# Patient Record
Sex: Female | Born: 1935 | Smoking: Former smoker
Health system: Southern US, Community
[De-identification: ages and names within clinical notes are randomized; demographics above are authoritative.]

## PROBLEM LIST (undated history)

## (undated) DIAGNOSIS — E871 Hypo-osmolality and hyponatremia: Secondary | ICD-10-CM

## (undated) DIAGNOSIS — I10 Essential (primary) hypertension: Secondary | ICD-10-CM

## (undated) DIAGNOSIS — M109 Gout, unspecified: Secondary | ICD-10-CM

## (undated) DIAGNOSIS — H547 Unspecified visual loss: Secondary | ICD-10-CM

## (undated) DIAGNOSIS — E7800 Pure hypercholesterolemia, unspecified: Secondary | ICD-10-CM

## (undated) DIAGNOSIS — I4891 Unspecified atrial fibrillation: Secondary | ICD-10-CM

## (undated) DIAGNOSIS — E876 Hypokalemia: Secondary | ICD-10-CM

## (undated) HISTORY — DX: Hypo-osmolality and hyponatremia: E87.1

## (undated) HISTORY — DX: Hypokalemia: E87.6

## (undated) HISTORY — DX: Unspecified atrial fibrillation: I48.91

## (undated) HISTORY — DX: Unspecified visual loss: H54.7

## (undated) HISTORY — DX: Pure hypercholesterolemia, unspecified: E78.00

---

## 2011-08-01 ENCOUNTER — Emergency Department: Payer: Self-pay | Admitting: Emergency Medicine

## 2011-08-01 LAB — BASIC METABOLIC PANEL
Anion Gap: 5 — ABNORMAL LOW (ref 7–16)
BUN: 13 mg/dL (ref 7–18)
Calcium, Total: 9.2 mg/dL (ref 8.5–10.1)
Chloride: 96 mmol/L — ABNORMAL LOW (ref 98–107)
Co2: 31 mmol/L (ref 21–32)
Creatinine: 0.95 mg/dL (ref 0.60–1.30)
EGFR (African American): >60
EGFR (Non-African Amer.): 58 — ABNORMAL LOW
Glucose: 112 mg/dL — ABNORMAL HIGH (ref 65–99)
Osmolality: 265 (ref 275–301)
Potassium: 3.2 mmol/L — ABNORMAL LOW (ref 3.5–5.1)
Sodium: 132 mmol/L — ABNORMAL LOW (ref 136–145)

## 2011-08-01 LAB — CBC
HCT: 36.3 % (ref 35.0–47.0)
HGB: 12.4 g/dL (ref 12.0–16.0)
MCH: 30.6 pg (ref 26.0–34.0)
MCHC: 34.2 g/dL (ref 32.0–36.0)
MCV: 89 fL (ref 80–100)
Platelet: 236 10*3/uL (ref 150–440)
RBC: 4.06 10*6/uL (ref 3.80–5.20)
RDW: 13.2 % (ref 11.5–14.5)
WBC: 11.6 10*3/uL — ABNORMAL HIGH (ref 3.6–11.0)

## 2011-08-01 IMAGING — CT CT MAXILLOFACIAL WITH CONTRAST
1 series · 15 of 30 positions shown, 19 images · IV contrast (isovue)
Comparison: No comparison

REASON FOR EXAM: facial swelling right side, worse today    flex 10
COMMENTS:

PROCEDURE:     CT  - CT MAXILLOFACIAL AREA W  - [DATE] [DATE]
RESULT:     History: Facial swelling
TECHNIQUE: Multiple axial images obtained of the maxillofacial bones with
coronal reformatted images provided following 75 mL of Isovue 300
intravenous contrast.

[Series 4: facial 3.0 soft tissue · axial · 0.34mm/px · z∈[-174,+3]mm · 15 of 65 slices shown, 19 images]
[im 3/65  brain]
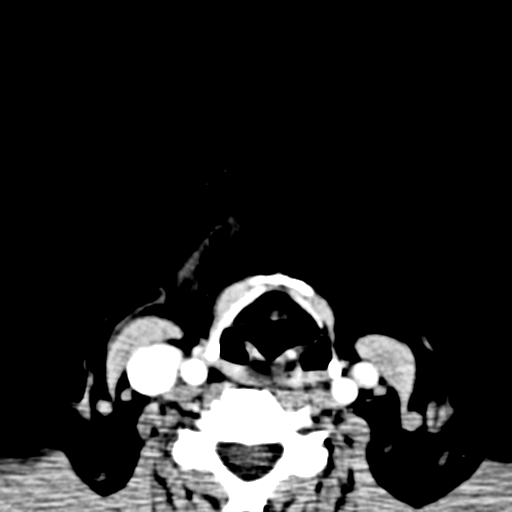
[im 3/65  bone]
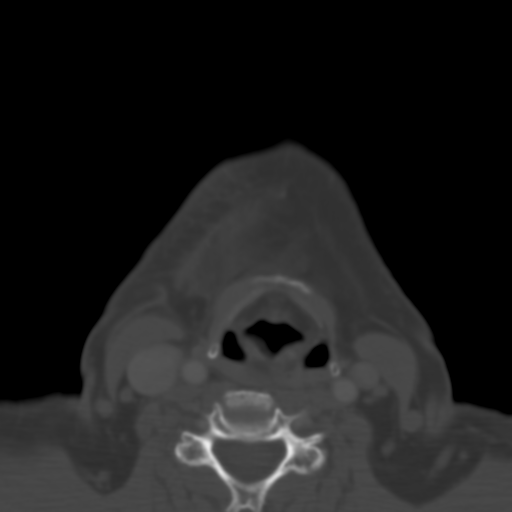
[im 7/65  bone]
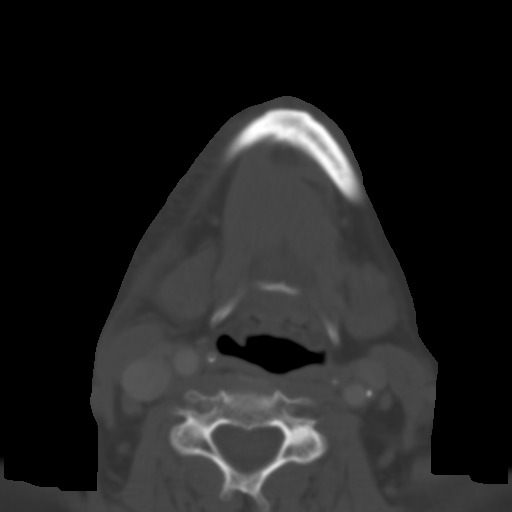
[im 12/65  bone]
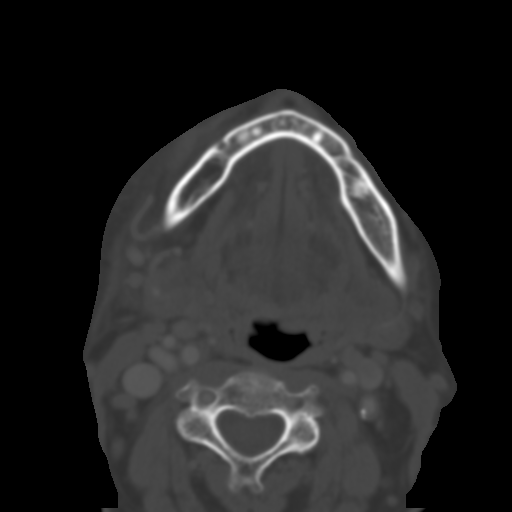
[im 16/65  bone]
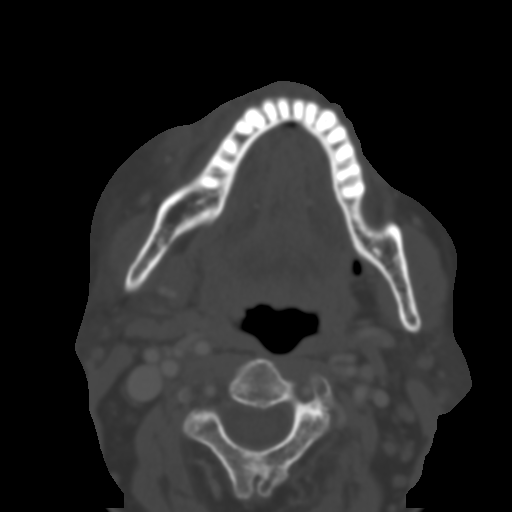
[im 20/65  brain]
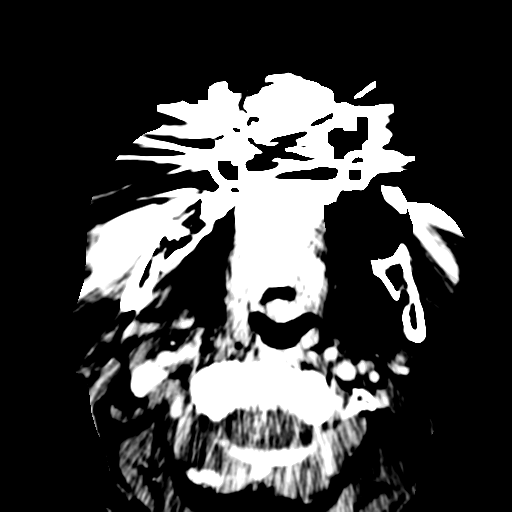
[im 20/65  bone]
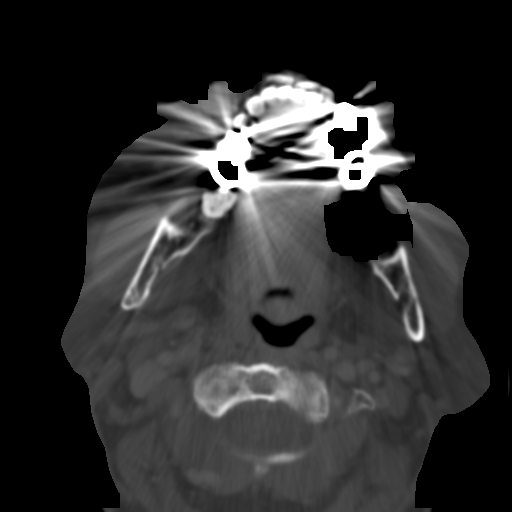
[im 25/65  bone]
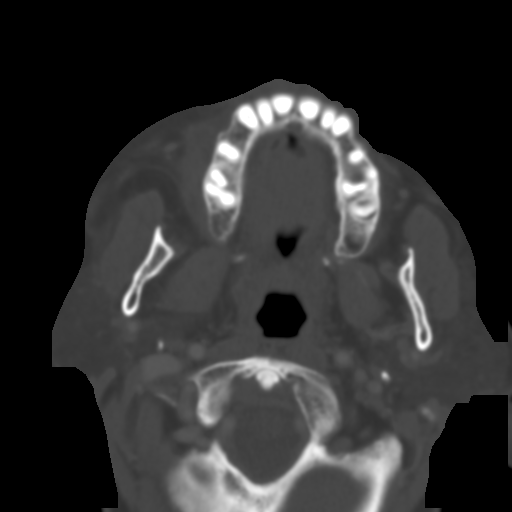
[im 29/65  bone]
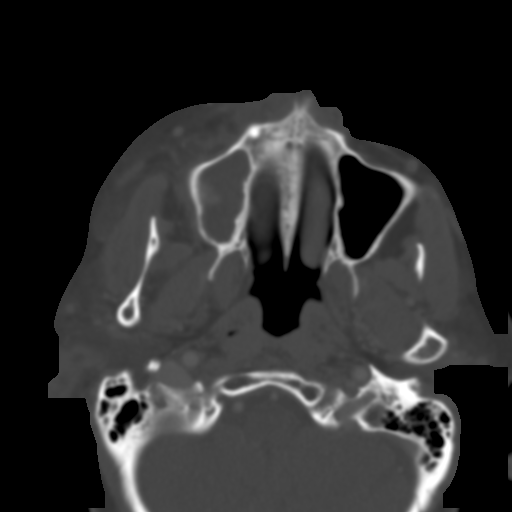
[im 34/65  bone]
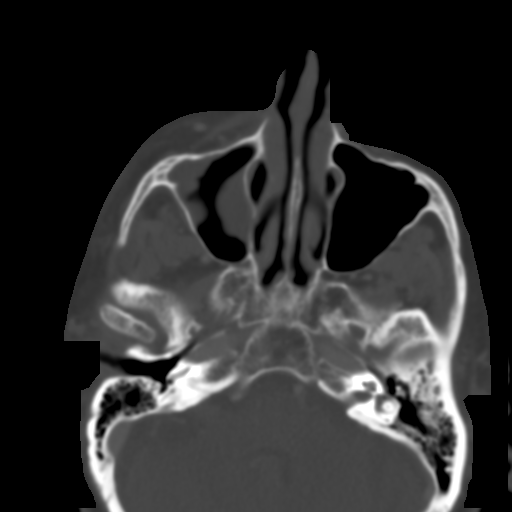
[im 36/65  brain]
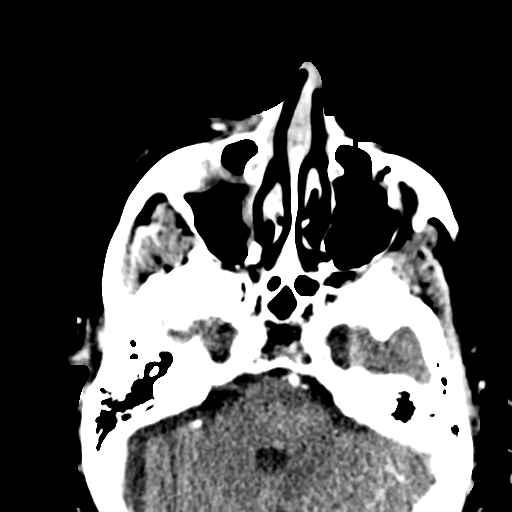
[im 36/65  bone]
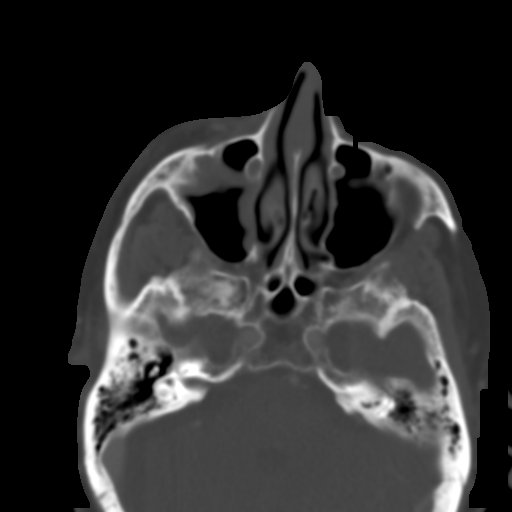
[im 40/65  bone]
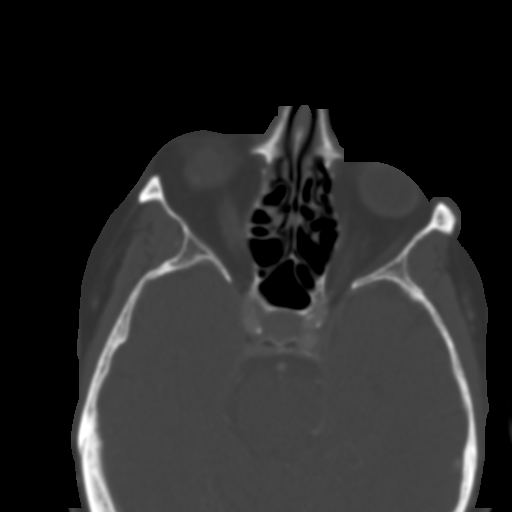
[im 45/65  bone]
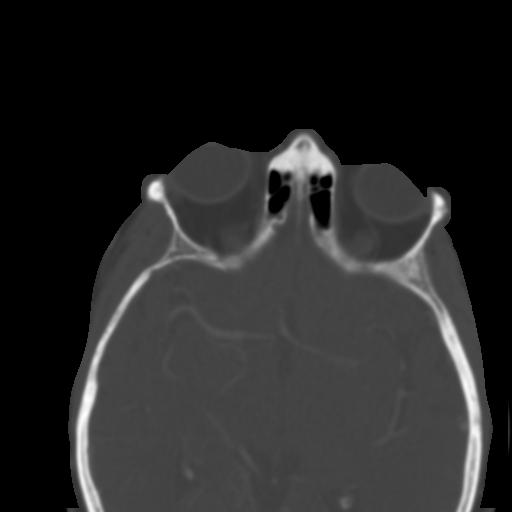
[im 49/65  bone]
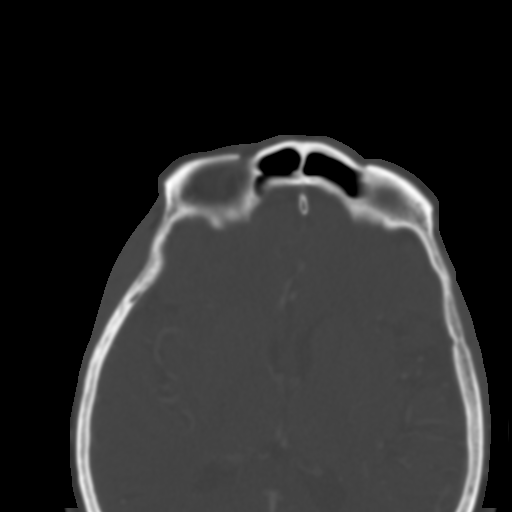
[im 53/65  brain]
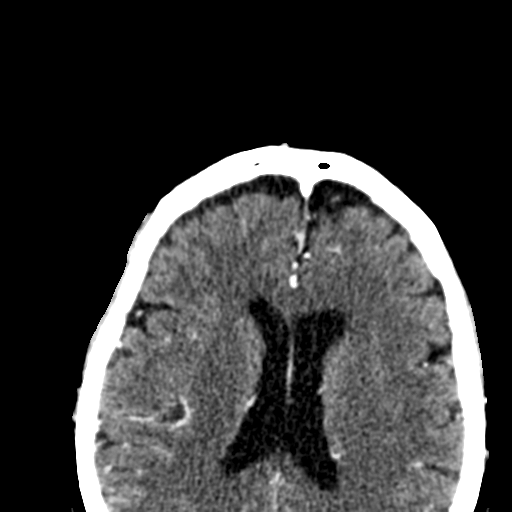
[im 53/65  bone]
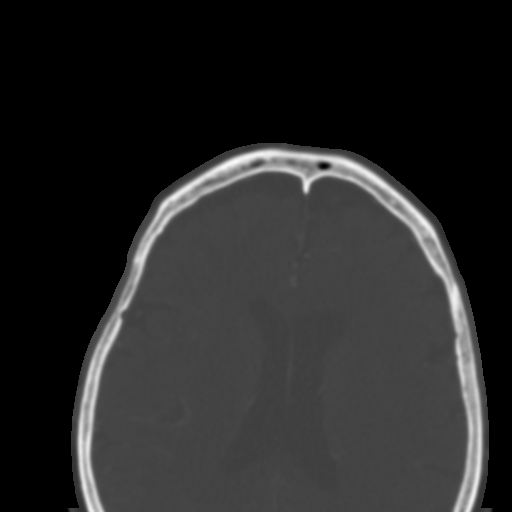
[im 58/65  bone]
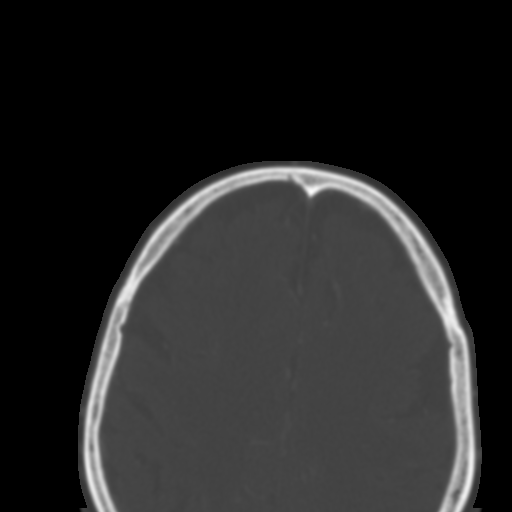
[im 62/65  bone]
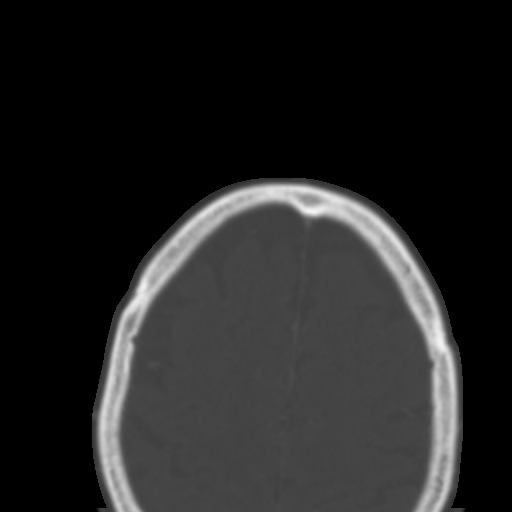

[15 of 30 positions shown; findings below may reference images not displayed]

FINDINGS: There is right facial soft tissue swelling. The soft tissues at the level of
the body of the mandible are secured by beam hardening artifact from dental
hardware. There is mild hazy inflammatory changes within the subcutaneous
fat. There is no focal fluid collection to suggest an abscess. There are
increased area of soft tissue attenuation along the right side of the
maxilla concerning for a phlegmon. Correlate with odontogenic disease and
dental exam. There is right maxillary sinus mucosal thickening.

The globes are intact. The orbital walls are intact. The orbital floor is
intact. The maxilla and mandible are intact. The zygomatic arches are
intact. The nasal septum is midline. There is no nasal bone fracture. The
temporomandibular joints are normal.

The visualized portions of the mastoid sinuses are well aerated.
IMPRESSION: There is right facial soft tissue swelling. The soft tissues at the level of
the body of the mandible are secured by beam hardening artifact from dental
hardware. There is mild hazy inflammatory changes within the subcutaneous
fat. There is no focal fluid collection to suggest an abscess. There are
increased area of soft tissue attenuation along the right side of the
maxilla concerning for a phlegmon. Correlate with odontogenic disease and
dental exam. There is right maxillary sinus mucosal thickening.

[REDACTED]

## 2014-05-03 DIAGNOSIS — R7301 Impaired fasting glucose: Secondary | ICD-10-CM | POA: Diagnosis not present

## 2014-05-03 DIAGNOSIS — N289 Disorder of kidney and ureter, unspecified: Secondary | ICD-10-CM | POA: Diagnosis not present

## 2014-12-17 DIAGNOSIS — E782 Mixed hyperlipidemia: Secondary | ICD-10-CM | POA: Diagnosis not present

## 2014-12-17 DIAGNOSIS — R7309 Other abnormal glucose: Secondary | ICD-10-CM | POA: Diagnosis not present

## 2014-12-17 DIAGNOSIS — N183 Chronic kidney disease, stage 3 (moderate): Secondary | ICD-10-CM | POA: Diagnosis not present

## 2014-12-17 DIAGNOSIS — I10 Essential (primary) hypertension: Secondary | ICD-10-CM | POA: Diagnosis not present

## 2014-12-17 DIAGNOSIS — Z1389 Encounter for screening for other disorder: Secondary | ICD-10-CM | POA: Diagnosis not present

## 2014-12-17 DIAGNOSIS — M109 Gout, unspecified: Secondary | ICD-10-CM | POA: Diagnosis not present

## 2014-12-17 DIAGNOSIS — Z23 Encounter for immunization: Secondary | ICD-10-CM | POA: Diagnosis not present

## 2016-01-06 DIAGNOSIS — R7303 Prediabetes: Secondary | ICD-10-CM | POA: Diagnosis not present

## 2016-01-06 DIAGNOSIS — E782 Mixed hyperlipidemia: Secondary | ICD-10-CM | POA: Diagnosis not present

## 2016-01-06 DIAGNOSIS — N183 Chronic kidney disease, stage 3 (moderate): Secondary | ICD-10-CM | POA: Diagnosis not present

## 2016-01-06 DIAGNOSIS — I10 Essential (primary) hypertension: Secondary | ICD-10-CM | POA: Diagnosis not present

## 2016-01-06 DIAGNOSIS — M109 Gout, unspecified: Secondary | ICD-10-CM | POA: Diagnosis not present

## 2016-10-02 DIAGNOSIS — M109 Gout, unspecified: Secondary | ICD-10-CM | POA: Diagnosis not present

## 2016-10-02 DIAGNOSIS — H6993 Unspecified Eustachian tube disorder, bilateral: Secondary | ICD-10-CM | POA: Diagnosis not present

## 2016-10-02 DIAGNOSIS — H6123 Impacted cerumen, bilateral: Secondary | ICD-10-CM | POA: Diagnosis not present

## 2017-01-07 DIAGNOSIS — E782 Mixed hyperlipidemia: Secondary | ICD-10-CM | POA: Diagnosis not present

## 2017-01-07 DIAGNOSIS — M109 Gout, unspecified: Secondary | ICD-10-CM | POA: Diagnosis not present

## 2017-01-07 DIAGNOSIS — I10 Essential (primary) hypertension: Secondary | ICD-10-CM | POA: Diagnosis not present

## 2017-01-07 DIAGNOSIS — R7303 Prediabetes: Secondary | ICD-10-CM | POA: Diagnosis not present

## 2017-02-05 ENCOUNTER — Encounter (HOSPITAL_BASED_OUTPATIENT_CLINIC_OR_DEPARTMENT_OTHER): Payer: Self-pay | Admitting: Emergency Medicine

## 2017-02-05 ENCOUNTER — Emergency Department (HOSPITAL_BASED_OUTPATIENT_CLINIC_OR_DEPARTMENT_OTHER): Payer: Medicare HMO

## 2017-02-05 ENCOUNTER — Other Ambulatory Visit: Payer: Self-pay

## 2017-02-05 ENCOUNTER — Observation Stay (HOSPITAL_BASED_OUTPATIENT_CLINIC_OR_DEPARTMENT_OTHER)
Admission: EM | Admit: 2017-02-05 | Discharge: 2017-02-07 | Disposition: A | Discharge status: Home / Self Care | Payer: Medicare HMO | Attending: Internal Medicine | Admitting: Internal Medicine

## 2017-02-05 DIAGNOSIS — R55 Syncope and collapse: Secondary | ICD-10-CM | POA: Insufficient documentation

## 2017-02-05 DIAGNOSIS — Z91040 Latex allergy status: Secondary | ICD-10-CM | POA: Insufficient documentation

## 2017-02-05 DIAGNOSIS — I480 Paroxysmal atrial fibrillation: Secondary | ICD-10-CM | POA: Diagnosis not present

## 2017-02-05 DIAGNOSIS — I4891 Unspecified atrial fibrillation: Secondary | ICD-10-CM | POA: Diagnosis present

## 2017-02-05 DIAGNOSIS — I482 Chronic atrial fibrillation: Secondary | ICD-10-CM | POA: Diagnosis not present

## 2017-02-05 DIAGNOSIS — Z79899 Other long term (current) drug therapy: Secondary | ICD-10-CM | POA: Diagnosis not present

## 2017-02-05 DIAGNOSIS — I10 Essential (primary) hypertension: Secondary | ICD-10-CM | POA: Insufficient documentation

## 2017-02-05 DIAGNOSIS — E876 Hypokalemia: Secondary | ICD-10-CM | POA: Diagnosis present

## 2017-02-05 DIAGNOSIS — Z880 Allergy status to penicillin: Secondary | ICD-10-CM | POA: Diagnosis not present

## 2017-02-05 DIAGNOSIS — R946 Abnormal results of thyroid function studies: Secondary | ICD-10-CM | POA: Insufficient documentation

## 2017-02-05 DIAGNOSIS — E871 Hypo-osmolality and hyponatremia: Secondary | ICD-10-CM | POA: Diagnosis not present

## 2017-02-05 DIAGNOSIS — R531 Weakness: Secondary | ICD-10-CM | POA: Diagnosis not present

## 2017-02-05 DIAGNOSIS — R110 Nausea: Secondary | ICD-10-CM | POA: Insufficient documentation

## 2017-02-05 DIAGNOSIS — I700 Atherosclerosis of aorta: Secondary | ICD-10-CM | POA: Diagnosis not present

## 2017-02-05 DIAGNOSIS — R7989 Other specified abnormal findings of blood chemistry: Secondary | ICD-10-CM | POA: Diagnosis not present

## 2017-02-05 DIAGNOSIS — R002 Palpitations: Secondary | ICD-10-CM | POA: Insufficient documentation

## 2017-02-05 HISTORY — DX: Essential (primary) hypertension: I10

## 2017-02-05 HISTORY — DX: Gout, unspecified: M10.9

## 2017-02-05 LAB — BASIC METABOLIC PANEL
Anion gap: 12 (ref 5–15)
BUN: 18 mg/dL (ref 6–20)
CO2: 23 mmol/L (ref 22–32)
Calcium: 8.3 mg/dL — ABNORMAL LOW (ref 8.9–10.3)
Chloride: 90 mmol/L — ABNORMAL LOW (ref 101–111)
Creatinine, Ser: 1.16 mg/dL — ABNORMAL HIGH (ref 0.44–1.00)
GFR calc Af Amer: 50 mL/min — ABNORMAL LOW (ref >60)
GFR calc non Af Amer: 43 mL/min — ABNORMAL LOW (ref >60)
Glucose, Bld: 107 mg/dL — ABNORMAL HIGH (ref 65–99)
Potassium: 2.7 mmol/L — CL (ref 3.5–5.1)
Sodium: 125 mmol/L — ABNORMAL LOW (ref 135–145)

## 2017-02-05 LAB — CBC WITH DIFFERENTIAL/PLATELET
Basophils Absolute: 0 10*3/uL (ref 0.0–0.1)
Basophils Relative: 0 %
Eosinophils Absolute: 0 10*3/uL (ref 0.0–0.7)
Eosinophils Relative: 0 %
HCT: 34.2 % — ABNORMAL LOW (ref 36.0–46.0)
Hemoglobin: 12 g/dL (ref 12.0–15.0)
Lymphocytes Relative: 16 %
Lymphs Abs: 1.9 10*3/uL (ref 0.7–4.0)
MCH: 30.1 pg (ref 26.0–34.0)
MCHC: 35.1 g/dL (ref 30.0–36.0)
MCV: 85.7 fL (ref 78.0–100.0)
Monocytes Absolute: 0.6 10*3/uL (ref 0.1–1.0)
Monocytes Relative: 5 %
Neutro Abs: 9.6 10*3/uL — ABNORMAL HIGH (ref 1.7–7.7)
Neutrophils Relative %: 79 %
Platelets: 184 10*3/uL (ref 150–400)
RBC: 3.99 MIL/uL (ref 3.87–5.11)
RDW: 12.8 % (ref 11.5–15.5)
WBC: 12.1 10*3/uL — ABNORMAL HIGH (ref 4.0–10.5)

## 2017-02-05 LAB — COMPREHENSIVE METABOLIC PANEL
ALT: 20 U/L (ref 14–54)
AST: 32 U/L (ref 15–41)
Albumin: 3.6 g/dL (ref 3.5–5.0)
Alkaline Phosphatase: 50 U/L (ref 38–126)
Anion gap: 14 (ref 5–15)
BUN: 17 mg/dL (ref 6–20)
CO2: 23 mmol/L (ref 22–32)
Calcium: 8.8 mg/dL — ABNORMAL LOW (ref 8.9–10.3)
Chloride: 85 mmol/L — ABNORMAL LOW (ref 101–111)
Creatinine, Ser: 1.12 mg/dL — ABNORMAL HIGH (ref 0.44–1.00)
GFR calc Af Amer: 52 mL/min — ABNORMAL LOW (ref >60)
GFR calc non Af Amer: 45 mL/min — ABNORMAL LOW (ref >60)
Glucose, Bld: 129 mg/dL — ABNORMAL HIGH (ref 65–99)
Potassium: 3.1 mmol/L — ABNORMAL LOW (ref 3.5–5.1)
Sodium: 122 mmol/L — ABNORMAL LOW (ref 135–145)
Total Bilirubin: 1 mg/dL (ref 0.3–1.2)
Total Protein: 7.1 g/dL (ref 6.5–8.1)

## 2017-02-05 LAB — OSMOLALITY, URINE: Osmolality, Ur: 362 mOsm/kg (ref 300–900)

## 2017-02-05 LAB — PROTIME-INR
INR: 0.96
Prothrombin Time: 12.7 seconds (ref 11.4–15.2)

## 2017-02-05 LAB — LIPASE, BLOOD: Lipase: 18 U/L (ref 11–51)

## 2017-02-05 LAB — TSH: TSH: 6.325 u[IU]/mL — ABNORMAL HIGH (ref 0.350–4.500)

## 2017-02-05 LAB — TROPONIN I: Troponin I: <0.03 ng/mL (ref <0.03)

## 2017-02-05 LAB — T4, FREE: Free T4: 1.21 ng/dL — ABNORMAL HIGH (ref 0.61–1.12)

## 2017-02-05 LAB — D-DIMER, QUANTITATIVE: D-Dimer, Quant: 1.21 ug/mL-FEU — ABNORMAL HIGH (ref 0.00–0.50)

## 2017-02-05 LAB — OSMOLALITY: Osmolality: 266 mOsm/kg — ABNORMAL LOW (ref 275–295)

## 2017-02-05 LAB — CBG MONITORING, ED: Glucose-Capillary: 142 mg/dL — ABNORMAL HIGH (ref 65–99)

## 2017-02-05 IMAGING — CR DG CHEST 2V
2 series · 2 of 2 positions shown · non-contrast
Comparison: None.

CLINICAL DATA: Weakness and fatigue for the past week.

EXAM:
CHEST  2 VIEW

[w chest pa]
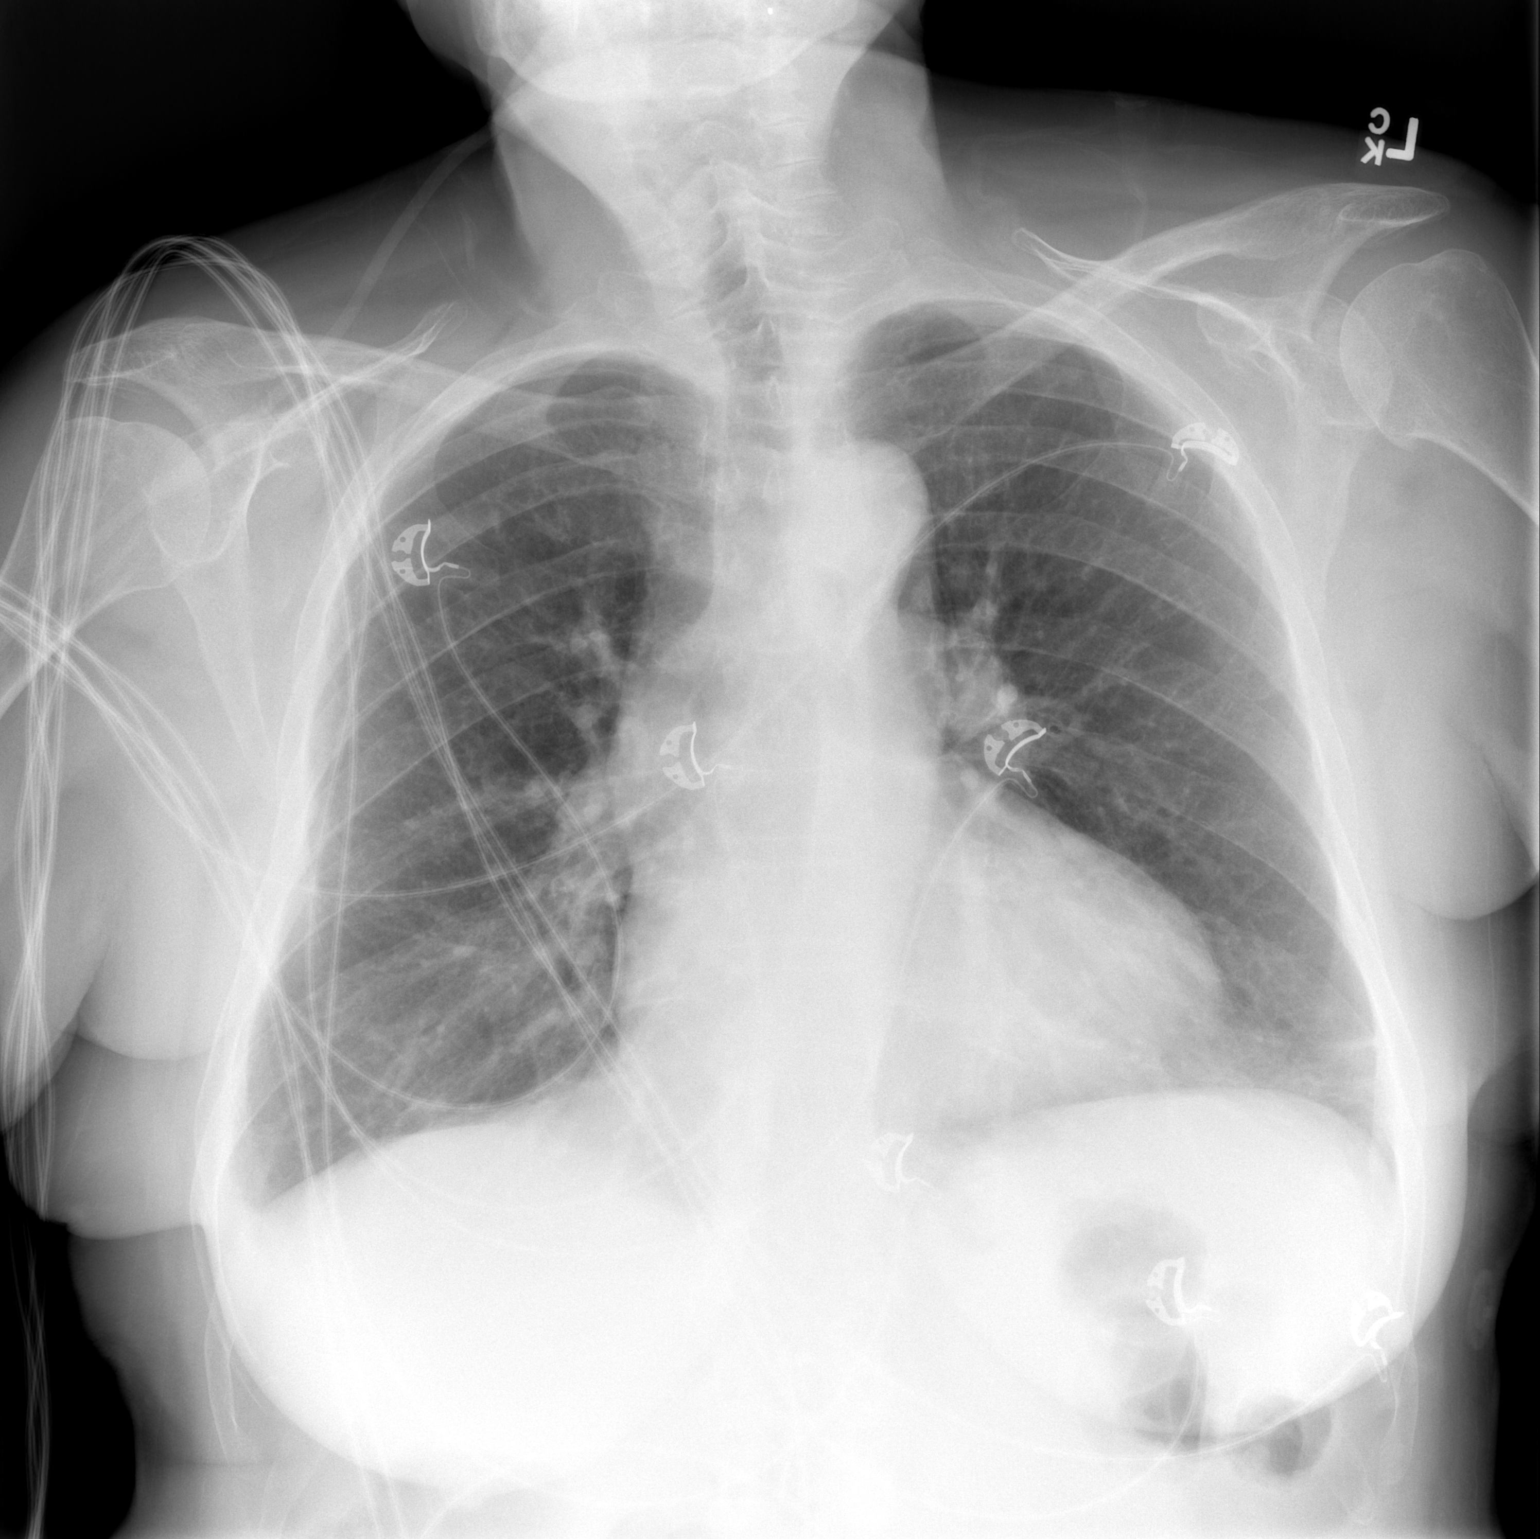

[w chest lat]
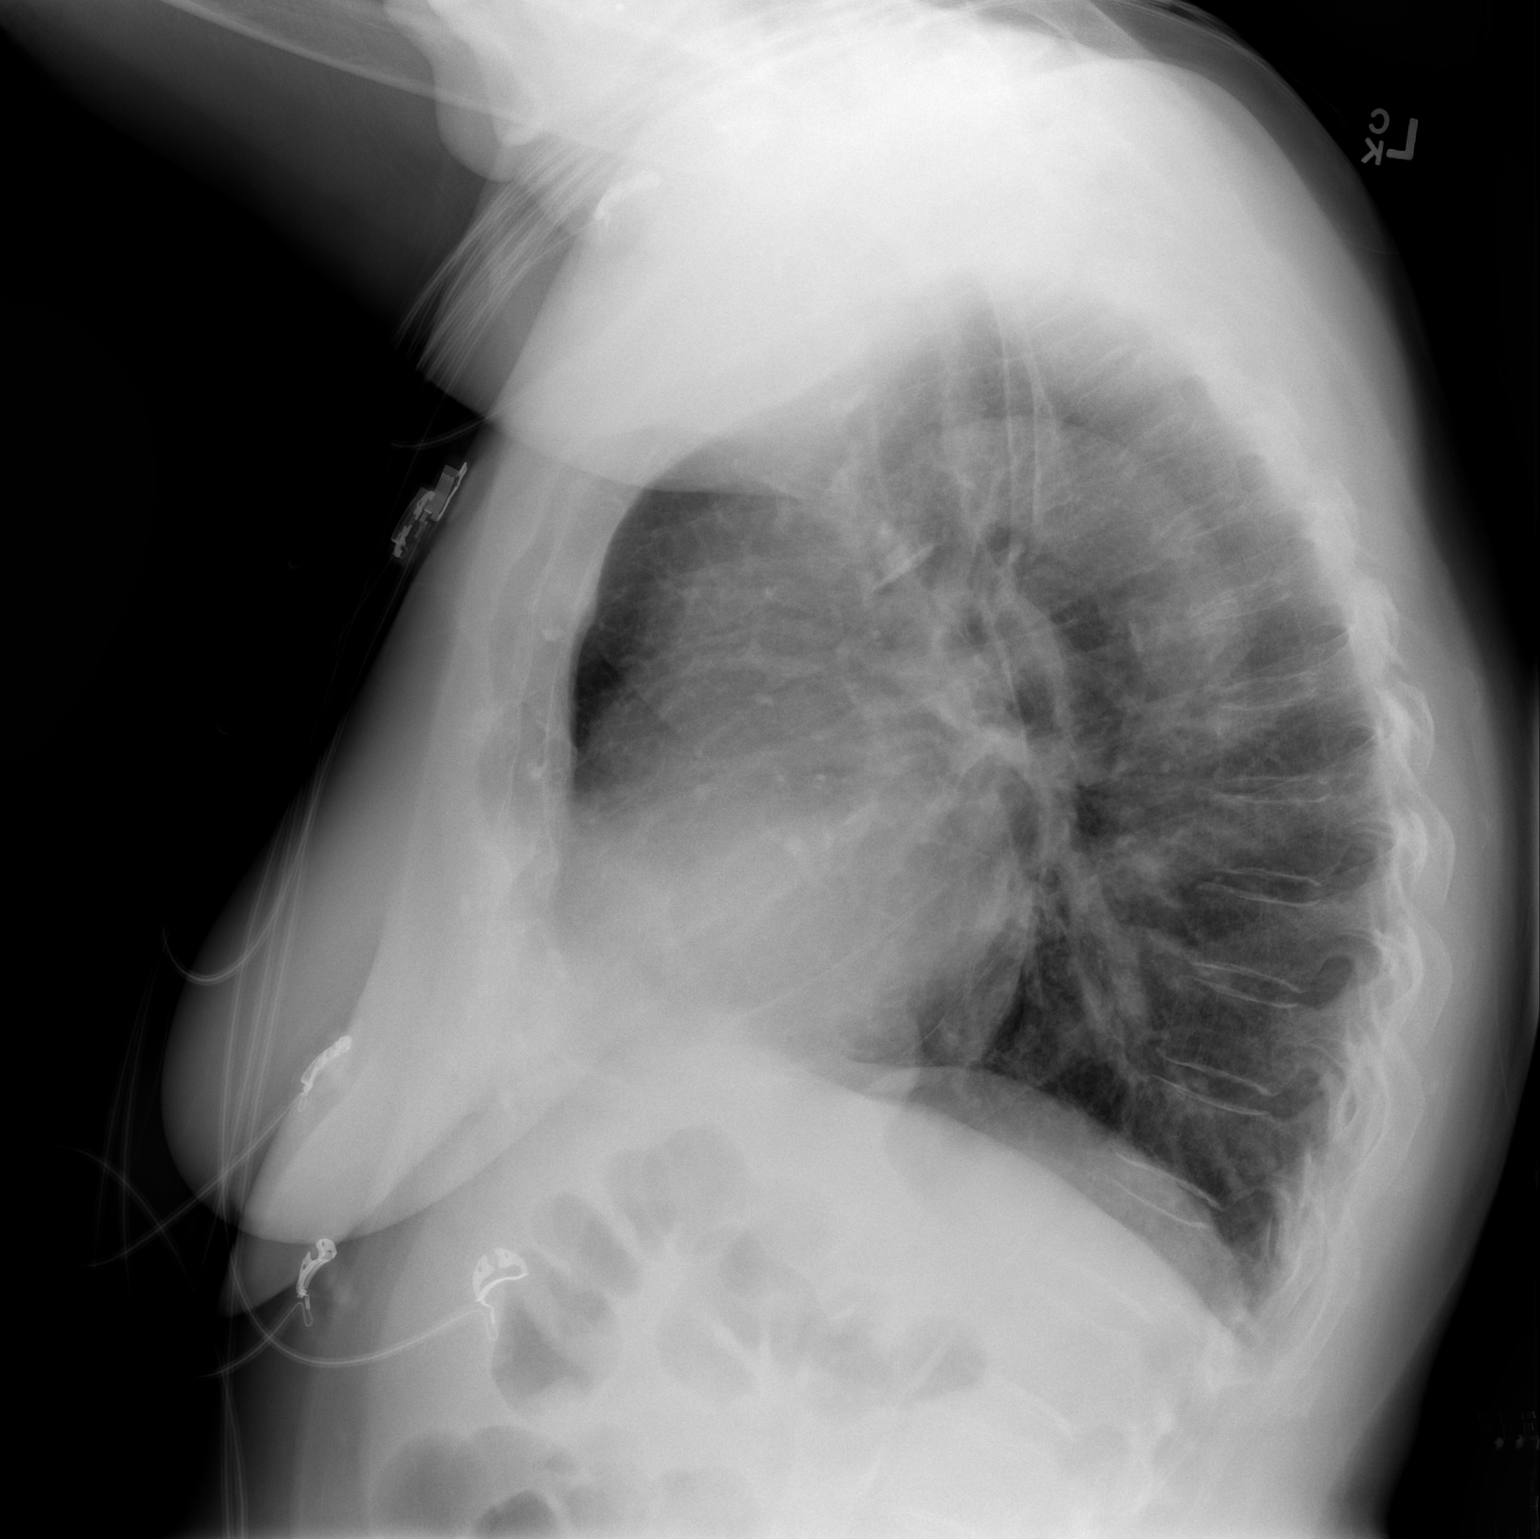

[2 of 2 positions shown; findings below may reference images not displayed]

FINDINGS: Mildly enlarged cardiac silhouette. Small amount of linear density
at the left lung base. Small amount of right pleural thickening or
fluid. Unremarkable bones.
IMPRESSION: 1. Small amount of linear atelectasis or scarring at the left lung
base.
2. Small amount of right pleural thickening or fluid.
3. Mild cardiomegaly

## 2017-02-05 IMAGING — CT CT ANGIO CHEST
2 of 15 series · 16 of 38 positions shown · IV contrast (iopamidol)
Comparison: None.

CLINICAL DATA: Nausea.  Elevated D-dimer.

EXAM:
CT ANGIOGRAPHY CHEST WITH CONTRAST
TECHNIQUE: Multidetector CT imaging of the chest was performed using the
standard protocol during bolus administration of intravenous
contrast. Multiplanar CT image reconstructions and MIPs were
obtained to evaluate the vascular anatomy.
CONTRAST:  100mL [LA] IOPAMIDOL ([LA]) INJECTION 76%

[Series 6: pe thins · axial · 0.86mm/px · z∈[-245,-43]mm · 12 of 240 slices shown (1 of 2)]
[im 19/240  lung]
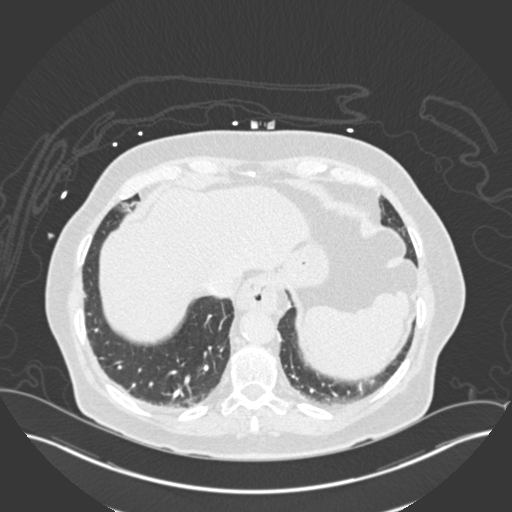
[im 37/240  mediastinal]
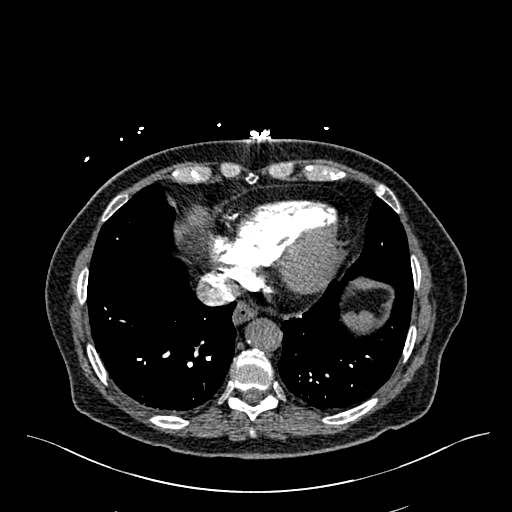
[im 56/240  lung]
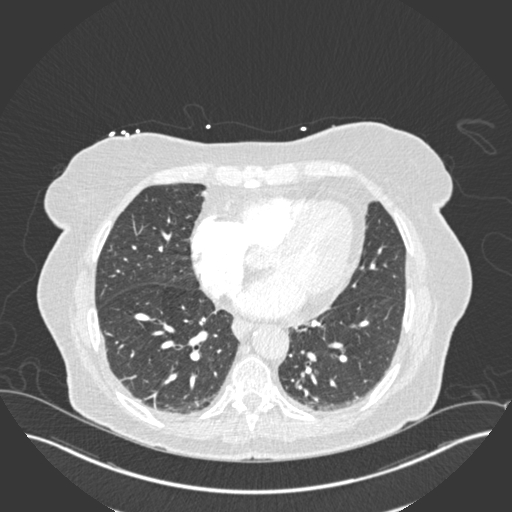
[im 74/240  mediastinal]
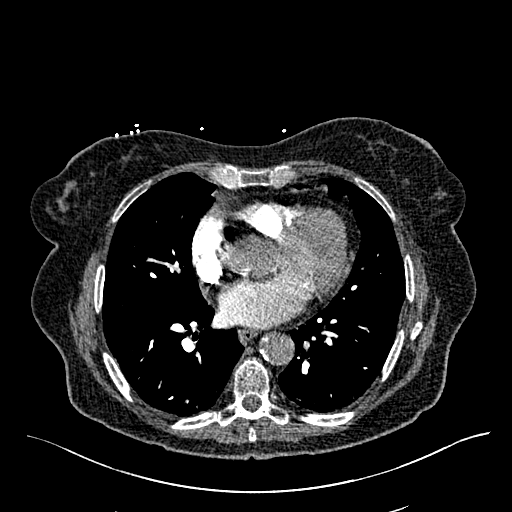
[im 92/240  lung]
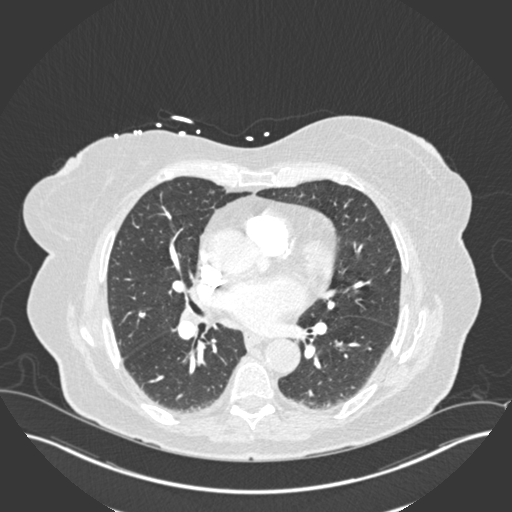
[im 111/240  mediastinal]
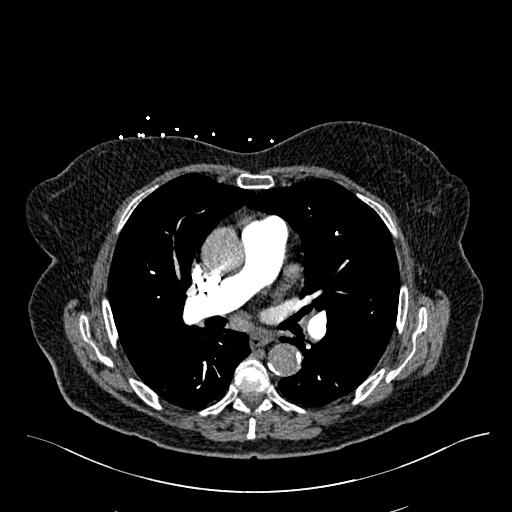
[im 129/240  lung]
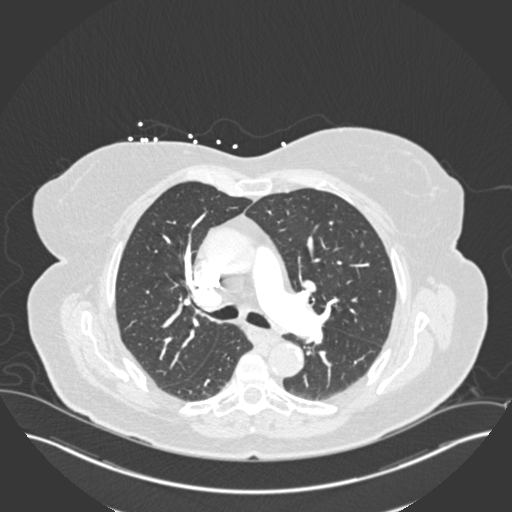
[im 148/240  mediastinal]
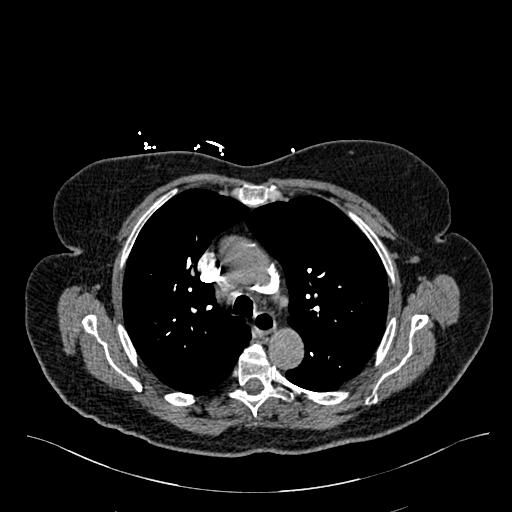
[im 166/240  lung]
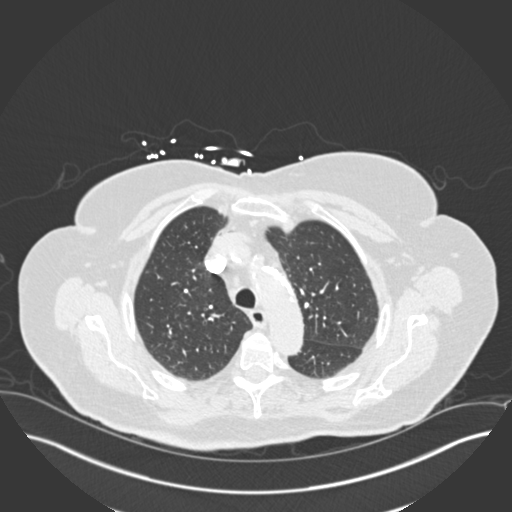
[im 184/240  mediastinal]
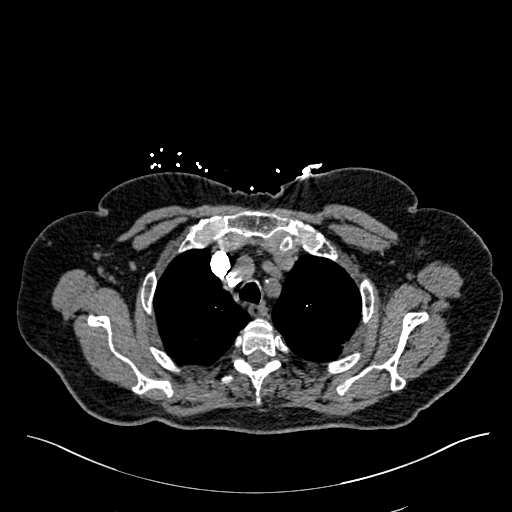
[im 203/240  lung]
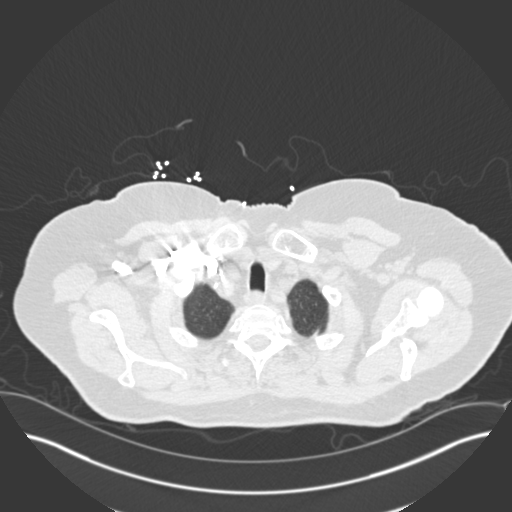
[im 221/240  mediastinal]
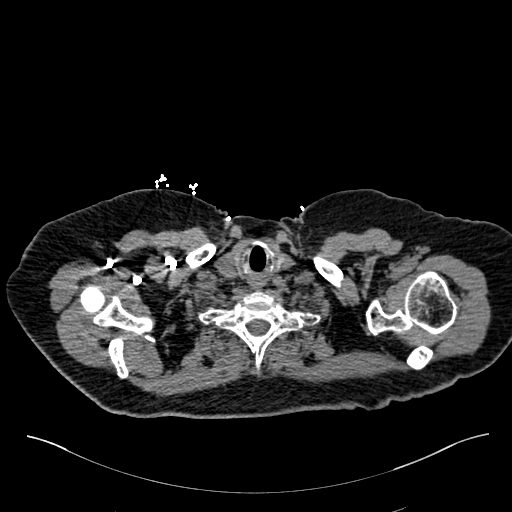

[Series 13: pe thins · axial · 0.86mm/px · z∈[-314,-256]mm · 4 of 98 slices shown (2 of 2)]
[im 20/98  lung]
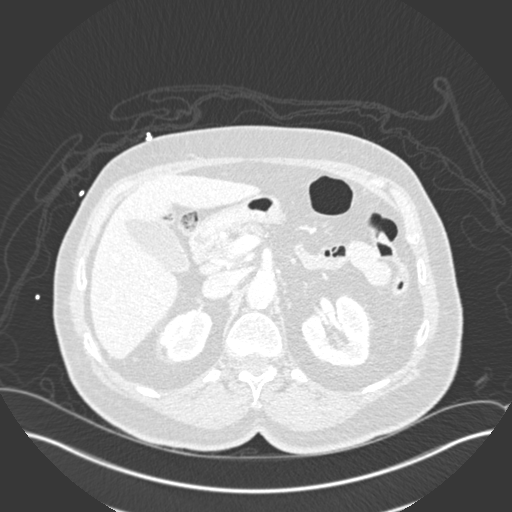
[im 39/98  lung]
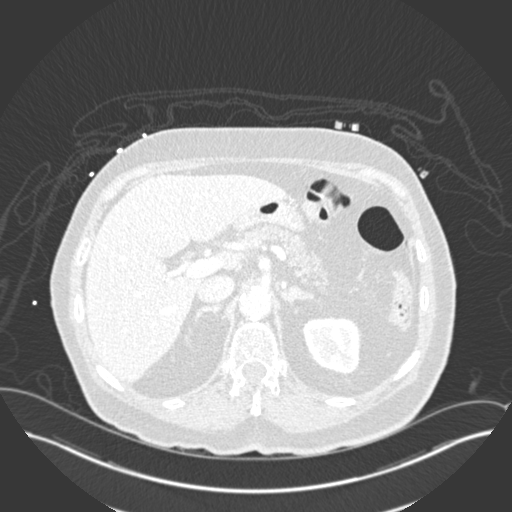
[im 59/98  lung]
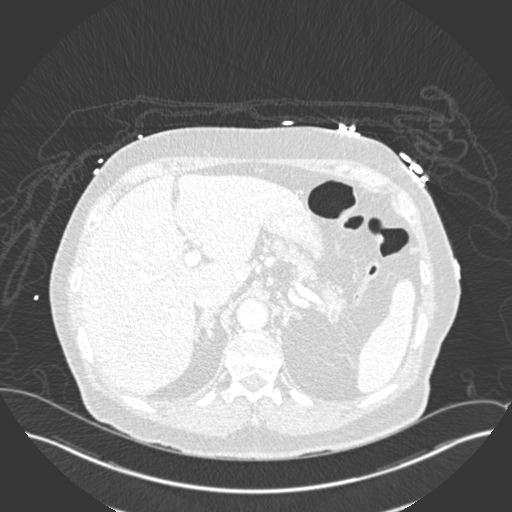
[im 78/98  lung]
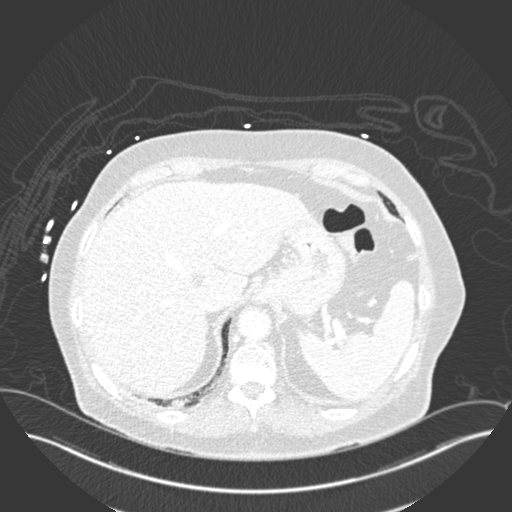

[16 of 38 positions shown; findings below may reference images not displayed]

FINDINGS: Cardiovascular: The pulmonary arteries are adequately opacified.
There is no evidence of pulmonary embolism. Central pulmonary
arteries are normal in caliber. The heart size is normal. No
pericardial fluid. Calcified plaque noted in the left main coronary
artery and LAD. Calcified plaque in the thoracic aorta without
evidence of aortic aneurysm.

Mediastinum/Nodes: No enlarged mediastinal, hilar, or axillary lymph
nodes. Thyroid gland, trachea, and esophagus demonstrate no
significant findings. There is a small/moderate hiatal hernia.

Lungs/Pleura: There is no evidence of pulmonary edema,
consolidation, pneumothorax, nodule or pleural fluid.

Upper Abdomen: Small 10 mm cyst along the lateral margin of the
caudate appears benign.

Musculoskeletal: No chest wall abnormality. No acute or significant
osseous findings.

Review of the MIP images confirms the above findings.
IMPRESSION: 1. No evidence of pulmonary embolism.
2. Coronary atherosclerosis with calcified plaque seen in the
distribution of the left main coronary artery and LA
3. Aortic atherosclerosis without evidence of aneurysmal disease of
the thoracic aorta.
4. Small a moderate sized hiatal hernia.

Aortic Atherosclerosis ([LA]-[LA]).

## 2017-02-05 MED ORDER — HYDROCODONE-ACETAMINOPHEN 5-325 MG PO TABS: 1.0000 | ORAL_TABLET | ORAL | Status: DC | PRN

## 2017-02-05 MED ORDER — POTASSIUM CHLORIDE CRYS ER 20 MEQ PO TBCR
40.0000 meq | EXTENDED_RELEASE_TABLET | Freq: Once | ORAL | Status: AC
  Administered 2017-02-05: 40 meq via ORAL
  Filled 2017-02-05: qty 2

## 2017-02-05 MED ORDER — ONDANSETRON HCL 4 MG/2ML IJ SOLN: 4.0000 mg | Freq: Four times a day (QID) | INTRAMUSCULAR | Status: DC | PRN

## 2017-02-05 MED ORDER — RIVAROXABAN 20 MG PO TABS: 20.0000 mg | ORAL_TABLET | Freq: Every day | ORAL | Status: DC

## 2017-02-05 MED ORDER — SODIUM CHLORIDE 0.9 % IV BOLUS (SEPSIS)
1000.0000 mL | Freq: Once | INTRAVENOUS | Status: AC
  Administered 2017-02-05: 1000 mL via INTRAVENOUS

## 2017-02-05 MED ORDER — SENNOSIDES-DOCUSATE SODIUM 8.6-50 MG PO TABS: 1.0000 | ORAL_TABLET | Freq: Every evening | ORAL | Status: DC | PRN

## 2017-02-05 MED ORDER — RIVAROXABAN 15 MG PO TABS
15.0000 mg | ORAL_TABLET | Freq: Every day | ORAL | Status: DC
  Administered 2017-02-05: 15 mg via ORAL
  Filled 2017-02-05: qty 1

## 2017-02-05 MED ORDER — SODIUM CHLORIDE 0.9% FLUSH
3.0000 mL | Freq: Two times a day (BID) | INTRAVENOUS | Status: DC
  Administered 2017-02-05 – 2017-02-06 (×3): 3 mL via INTRAVENOUS

## 2017-02-05 MED ORDER — ALLOPURINOL 100 MG PO TABS
100.0000 mg | ORAL_TABLET | Freq: Every day | ORAL | Status: DC
Start: 2017-02-06 — End: 2017-02-07
  Administered 2017-02-06 – 2017-02-07 (×2): 100 mg via ORAL
  Filled 2017-02-05 (×2): qty 1

## 2017-02-05 MED ORDER — DILTIAZEM HCL 30 MG PO TABS
30.0000 mg | ORAL_TABLET | Freq: Four times a day (QID) | ORAL | Status: DC
  Administered 2017-02-05: 30 mg via ORAL
  Filled 2017-02-05: qty 1

## 2017-02-05 MED ORDER — ACETAMINOPHEN 650 MG RE SUPP: 650.0000 mg | Freq: Four times a day (QID) | RECTAL | Status: DC | PRN

## 2017-02-05 MED ORDER — ATENOLOL 50 MG PO TABS
50.0000 mg | ORAL_TABLET | Freq: Every day | ORAL | Status: DC
  Filled 2017-02-05: qty 1

## 2017-02-05 MED ORDER — DILTIAZEM HCL 25 MG/5ML IV SOLN
10.0000 mg | Freq: Once | INTRAVENOUS | Status: AC
  Administered 2017-02-05: 10 mg via INTRAVENOUS
  Filled 2017-02-05: qty 5

## 2017-02-05 MED ORDER — BISACODYL 5 MG PO TBEC: 5.0000 mg | DELAYED_RELEASE_TABLET | Freq: Every day | ORAL | Status: DC | PRN

## 2017-02-05 MED ORDER — DILTIAZEM HCL 100 MG IV SOLR
5.0000 mg/h | INTRAVENOUS | Status: DC
  Administered 2017-02-05: 5 mg/h via INTRAVENOUS
  Filled 2017-02-05: qty 100

## 2017-02-05 MED ORDER — ACETAMINOPHEN 325 MG PO TABS: 650.0000 mg | ORAL_TABLET | Freq: Four times a day (QID) | ORAL | Status: DC | PRN

## 2017-02-05 MED ORDER — IOPAMIDOL (ISOVUE-370) INJECTION 76%
100.0000 mL | Freq: Once | INTRAVENOUS | Status: AC | PRN
  Administered 2017-02-05: 100 mL via INTRAVENOUS

## 2017-02-05 MED ORDER — ONDANSETRON HCL 4 MG PO TABS: 4.0000 mg | ORAL_TABLET | Freq: Four times a day (QID) | ORAL | Status: DC | PRN

## 2017-02-05 NOTE — Consult Note (Signed)
Reason for Consult: new afib   Requesting Physician/Service: [facility]   PCP:  No primary care provider on file. Primary Cardiologist:N/A  HPI:  84 YO female transferred from [facility] for AF, new. History of HTN.  Had noticed nausea for a couple days without vomiting.  Reported that she feels dehydrated.  Patient started on dilt drip which controlled rates and converted to NSR.  D-dimer was elevated and CTA was done, negative for PE.  Na was 122, K was 3.1-- given K replacement and IV fluids.  Trop negative.  She was dosed with 15 mg of Xarelto at 2:26 PM for the new AFib.  No issues with chest pain, SOB, edema.  Former smoker.  No recent illness other than this nausea for 2-3 days.  She denies history of stroke but at one point had a "retinal clot due to HTN" unclear if this was a hemorrhage or clot-- This was years ago.  No other CVA history.  ECG: 2 PM NSR  Past Medical History:  Diagnosis Date  . Gout   . Hypertension     History reviewed. No pertinent surgical history.  History reviewed. No pertinent family history. Social History:  reports that  has never smoked. she has never used smokeless tobacco. She reports that she does not drink alcohol or use drugs.  Allergies:  Allergies  Allergen Reactions  . Latex Rash    "Bandaids"     No current facility-administered medications on file prior to encounter.    Current Outpatient Medications on File Prior to Encounter  Medication Sig Dispense Refill  . allopurinol (ZYLOPRIM) 100 MG tablet Take 100 mg by mouth daily.    . metoprolol succinate (TOPROL-XL) 50 MG 24 hr tablet Take 50 mg by mouth daily. Take with or immediately following a meal.      Results for orders placed or performed during the hospital encounter of 02/05/17 (from the past 48 hour(s))  CBG monitoring, ED     Status: Abnormal   Collection Time: 02/05/17 10:47 AM  Result Value Ref Range   Glucose-Capillary 142 (H) 65 - 99 mg/dL  CBC with  Differential/Platelet     Status: Abnormal   Collection Time: 02/05/17 10:49 AM  Result Value Ref Range   WBC 12.1 (H) 4.0 - 10.5 K/uL   RBC 3.99 3.87 - 5.11 MIL/uL   Hemoglobin 12.0 12.0 - 15.0 g/dL   HCT 34.2 (L) 36.0 - 46.0 %   MCV 85.7 78.0 - 100.0 fL   MCH 30.1 26.0 - 34.0 pg   MCHC 35.1 30.0 - 36.0 g/dL   RDW 12.8 11.5 - 15.5 %   Platelets 184 150 - 400 K/uL   Neutrophils Relative % 79 %   Neutro Abs 9.6 (H) 1.7 - 7.7 K/uL   Lymphocytes Relative 16 %   Lymphs Abs 1.9 0.7 - 4.0 K/uL   Monocytes Relative 5 %   Monocytes Absolute 0.6 0.1 - 1.0 K/uL   Eosinophils Relative 0 %   Eosinophils Absolute 0.0 0.0 - 0.7 K/uL   Basophils Relative 0 %   Basophils Absolute 0.0 0.0 - 0.1 K/uL  Comprehensive metabolic panel     Status: Abnormal   Collection Time: 02/05/17 10:49 AM  Result Value Ref Range   Sodium 122 (L) 135 - 145 mmol/L   Potassium 3.1 (L) 3.5 - 5.1 mmol/L   Chloride 85 (L) 101 - 111 mmol/L   CO2 23 22 - 32 mmol/L   Glucose,  Bld 129 (H) 65 - 99 mg/dL   BUN 17 6 - 20 mg/dL   Creatinine, Ser 1.12 (H) 0.44 - 1.00 mg/dL   Calcium 8.8 (L) 8.9 - 10.3 mg/dL   Total Protein 7.1 6.5 - 8.1 g/dL   Albumin 3.6 3.5 - 5.0 g/dL   AST 32 15 - 41 U/L   ALT 20 14 - 54 U/L   Alkaline Phosphatase 50 38 - 126 U/L   Total Bilirubin 1.0 0.3 - 1.2 mg/dL   GFR calc non Af Amer 45 (L) >60 mL/min   GFR calc Af Amer 52 (L) >60 mL/min    Comment: (NOTE) The eGFR has been calculated using the CKD EPI equation. This calculation has not been validated in all clinical situations. eGFR's persistently <60 mL/min signify possible Chronic Kidney Disease.    Anion gap 14 5 - 15  Troponin I     Status: None   Collection Time: 02/05/17 10:49 AM  Result Value Ref Range   Troponin I <0.03 <0.03 ng/mL  Lipase, blood     Status: None   Collection Time: 02/05/17 10:49 AM  Result Value Ref Range   Lipase 18 11 - 51 U/L  Protime-INR     Status: None   Collection Time: 02/05/17 10:49 AM  Result  Value Ref Range   Prothrombin Time 12.7 11.4 - 15.2 seconds   INR 0.96   D-dimer, quantitative (not at [facility])     Status: Abnormal   Collection Time: 02/05/17 10:49 AM  Result Value Ref Range   D-Dimer, Quant 1.21 (H) 0.00 - 0.50 ug/mL-FEU    Comment: (NOTE) At the manufacturer cut-off of 0.50 ug/mL FEU, this assay has been documented to exclude PE with a sensitivity and negative predictive value of 97 to 99%.  At this time, this assay has not been approved by the FDA to exclude DVT/VTE. Results should be correlated with clinical presentation.    Dg Chest 2 View  Result Date: 02/05/2017 CLINICAL DATA:  Weakness and fatigue for the past week. EXAM: CHEST  2 VIEW COMPARISON:  None. FINDINGS: Mildly enlarged cardiac silhouette. Small amount of linear density at the left lung base. Small amount of right pleural thickening or fluid. Unremarkable bones. IMPRESSION: 1. Small amount of linear atelectasis or scarring at the left lung base. 2. Small amount of right pleural thickening or fluid. 3. Mild cardiomegaly Electronically Signed   By: Claudie Revering M.D.   On: 02/05/2017 11:42   Ct Angio Chest Pe W And/or Wo Contrast  Result Date: 02/05/2017 CLINICAL DATA:  Nausea.  Elevated D-dimer. EXAM: CT ANGIOGRAPHY CHEST WITH CONTRAST TECHNIQUE: Multidetector CT imaging of the chest was performed using the standard protocol during bolus administration of intravenous contrast. Multiplanar CT image reconstructions and MIPs were obtained to evaluate the vascular anatomy. CONTRAST:  114m ISOVUE-370 IOPAMIDOL (ISOVUE-370) INJECTION 76% COMPARISON:  None. FINDINGS: Cardiovascular: The pulmonary arteries are adequately opacified. There is no evidence of pulmonary embolism. Central pulmonary arteries are normal in caliber. The heart size is normal. No pericardial fluid. Calcified plaque noted in the left main coronary artery and LAD. Calcified plaque in the thoracic aorta without evidence of aortic aneurysm.  Mediastinum/Nodes: No enlarged mediastinal, hilar, or axillary lymph nodes. Thyroid gland, trachea, and esophagus demonstrate no significant findings. There is a small/moderate hiatal hernia. Lungs/Pleura: There is no evidence of pulmonary edema, consolidation, pneumothorax, nodule or pleural fluid. Upper Abdomen: Small 10 mm cyst along the lateral margin of the caudate appears benign.  Musculoskeletal: No chest wall abnormality. No acute or significant osseous findings. Review of the MIP images confirms the above findings. IMPRESSION: 1. No evidence of pulmonary embolism. 2. Coronary atherosclerosis with calcified plaque seen in the distribution of the left main coronary artery and LA 3. Aortic atherosclerosis without evidence of aneurysmal disease of the thoracic aorta. 4. Small a moderate sized hiatal hernia. Aortic Atherosclerosis (ICD10-I70.0). Electronically Signed   By: Aletta Edouard M.D.   On: 02/05/2017 13:01   ROS: As above. Otherwise, review of systems is negative unless per above HPI  Vitals:   02/05/17 1545 02/05/17 1746 02/05/17 1800 02/05/17 1934  BP: 112/65  (!) 115/58 (!) 117/50  Pulse: 65  67 66  Resp: 14   18  Temp:  (!) 97.5 F (36.4 C) 98.4 F (36.9 C) 97.9 F (36.6 C)  TempSrc:  Oral Oral Oral  SpO2: 96%  98% 98%  Weight:      Height:       Wt Readings from Last 10 Encounters:  02/05/17 78.5 kg (173 lb)    PE:  General: No acute distress HEENT: Atraumatic, EOMI, mucous membranes moist. No JVD at 45 degrees. No HJR. CV: RRR no murmurs, gallops.  Respiratory: Clear, no crackles. Normal work of breathing ABD: Non-distended and non-tender. No palpable organomegaly.  Extremities: 2+ radial pulses bilaterally. no edema. Neuro/Psych: CN grossly intact, alert and oriented  Assessment/Plan PAF, possibly acute in the setting of hyponatremia/hypokalemia-- CHADS2VASC 3 HTN ? History of "retinal clot due to HTN" -- details unclear  Recommendations - Echo in AM - OK to  D/C diltiazem now that in sinus - Continue home medication (atenolol/chlorthlaidone) -- she needs to confirm with the pharmacy if she was really on metoprolol at home or not - Already started on xarelto at OSH; OK to continue for now; if concerns about retinal hemorrhage history (which to me is unclear), can d/c without AC and put 30 d monitor on given AF presentation in setting of electrolyte abn.  - Tele overnight  Lolita Cram Ranyia Witting  MD 02/05/2017, 7:59 PM

## 2017-02-05 NOTE — H&P (Signed)
History and Physical    Amber Kennedy XFG:[id number] DOB: 1933-02-26 DOA: 02/05/2017  PCP: [name], MD   Patient coming from: Home, by way of [facility]   Chief Complaint: Nausea   HPI: Amber Kennedy is a 84 y.o. female with medical history significant for hypertension, now presenting to the emergency department for evaluation of severe nausea.  Patient reports that she had been in her usual state of health until approximately 3 days ago when she noted the fairly acute development of severe nausea without abdominal pain and without vomiting or diarrhea.  There was no chest pain, palpitations, dyspnea, fevers, or chills associated with this.  She had never experienced similar symptoms previously.  There was an associated loss of appetite.  [facility] ED Course: Upon arrival to the ED, patient is found to be afebrile, saturating well on room air, in atrial fibrillation with rates in the 140s, and with stable blood pressure.  EKG features atrial fibrillation with rate 142.  A chemistry panel is notable for a sodium of 122, potassium 3.1, and creatinine of 1.12.  CBC is notable for a leukocytosis to 12,100.  Troponin is undetectable.  D-dimer is elevated to 1.21.  CTA chest is negative for PE.  Patient was given an IV push of diltiazem and started on diltiazem he was given a liter of normal saline, 40 mEq of potassium, and was also started on Xarelto in the ED.  Cardiology was consulted by the ED physician.  Patient will be admitted to the telemetry unit at [facility] for ongoing evaluation and management new onset atrial fibrillation and severe electrolyte derangements.  Review of Systems:  All other systems reviewed and apart from HPI, are negative.  Past Medical History:  Diagnosis Date  . Gout   . Hypertension     History reviewed. No pertinent surgical history.   reports that  has never smoked. she has never used smokeless tobacco. She reports that she does not  drink alcohol or use drugs.  Allergies  Allergen Reactions  . Penicillin G Hives  . Latex Rash    "Bandaids"     Family History  Problem Relation Age of Onset  . Sudden Cardiac Death Neg Hx      Prior to Admission medications   Medication Sig Start Date End Date Taking? Authorizing Provider  allopurinol (ZYLOPRIM) 100 MG tablet Take 100 mg by mouth daily.   Yes [provider]  atenolol-chlorthalidone (TENORETIC) 50-25 MG tablet Take 1 tablet by mouth daily. 12/29/16   [provider]  metoprolol succinate (TOPROL-XL) 50 MG 24 hr tablet Take 50 mg by mouth daily. Take with or immediately following a meal.    [provider]    Physical Exam: Vitals:   02/05/17 1545 02/05/17 1746 02/05/17 1800 02/05/17 1934  BP: 112/65  (!) 115/58 (!) 117/50  Pulse: 65  67 66  Resp: 14   18  Temp:  (!) 97.5 F (36.4 C) 98.4 F (36.9 C) 97.9 F (36.6 C)  TempSrc:  Oral Oral Oral  SpO2: 96%  98% 98%  Weight:      Height:          Constitutional: NAD, calm Eyes: PERTLA, lids and conjunctivae normal ENMT: Mucous membranes are moist. Posterior pharynx clear of any exudate or lesions.   Neck: normal, supple, no masses, no thyromegaly Respiratory: clear to auscultation bilaterally, no wheezing, no crackles. Normal respiratory effort.    Cardiovascular: S1 & S2  heard, regular rate and rhythm. No significant JVD. Abdomen: No distension, no tenderness, no masses palpated. Bowel sounds normal.  Musculoskeletal: no clubbing / cyanosis. No joint deformity upper and lower extremities. Normal muscle tone.  Skin: no significant rashes, lesions, ulcers. Warm, dry, well-perfused. Neurologic: CN 2-12 grossly intact. Sensation intact. Strength 5/5 in all 4 limbs.  Psychiatric: Alert and oriented x 3. Calm, cooperative.     Labs on Admission: I have personally reviewed following labs and imaging studies  CBC: Recent Labs  Lab 02/05/17 1049  WBC 12.1*  NEUTROABS 9.6*   HGB 12.0  HCT 34.2*  MCV 85.7  PLT 315   Basic Metabolic Panel: Recent Labs  Lab 02/05/17 1049  NA 122*  K 3.1*  CL 85*  CO2 23  GLUCOSE 129*  BUN 17  CREATININE 1.12*  CALCIUM 8.8*   GFR: Estimated Creatinine Clearance: 42.5 mL/min (A) (by C-G formula based on SCr of 1.12 mg/dL (H)). Liver Function Tests: Recent Labs  Lab 02/05/17 1049  AST 32  ALT 20  ALKPHOS 50  BILITOT 1.0  PROT 7.1  ALBUMIN 3.6   Recent Labs  Lab 02/05/17 1049  LIPASE 18   No results for input(s): AMMONIA in the last 168 hours. Coagulation Profile: Recent Labs  Lab 02/05/17 1049  INR 0.96   Cardiac Enzymes: Recent Labs  Lab 02/05/17 1049  TROPONINI <0.03   BNP (last 3 results) No results for input(s): PROBNP in the last 8760 hours. HbA1C: No results for input(s): HGBA1C in the last 72 hours. CBG: Recent Labs  Lab 02/05/17 1047  GLUCAP 142*   Lipid Profile: No results for input(s): CHOL, HDL, LDLCALC, TRIG, CHOLHDL, LDLDIRECT in the last 72 hours. Thyroid Function Tests: No results for input(s): TSH, T4TOTAL, FREET4, T3FREE, THYROIDAB in the last 72 hours. Anemia Panel: No results for input(s): VITAMINB12, FOLATE, FERRITIN, TIBC, IRON, RETICCTPCT in the last 72 hours. Urine analysis: No results found for: COLORURINE, APPEARANCEUR, LABSPEC, PHURINE, GLUCOSEU, HGBUR, BILIRUBINUR, KETONESUR, PROTEINUR, UROBILINOGEN, NITRITE, LEUKOCYTESUR Sepsis Labs: @LABRCNTIP (procalcitonin:4,lacticidven:4) )No results found for this or any previous visit (from the past 240 hour(s)).   Radiological Exams on Admission: Dg Chest 2 View  Result Date: 02/05/2017 CLINICAL DATA:  Weakness and fatigue for the past week. EXAM: CHEST  2 VIEW COMPARISON:  None. FINDINGS: Mildly enlarged cardiac silhouette. Small amount of linear density at the left lung base. Small amount of right pleural thickening or fluid. Unremarkable bones. IMPRESSION: 1. Small amount of linear atelectasis or scarring at the  left lung base. 2. Small amount of right pleural thickening or fluid. 3. Mild cardiomegaly Electronically Signed   By: Claudie Revering M.D.   On: 02/05/2017 11:42   Ct Angio Chest Pe W And/or Wo Contrast  Result Date: 02/05/2017 CLINICAL DATA:  Nausea.  Elevated D-dimer. EXAM: CT ANGIOGRAPHY CHEST WITH CONTRAST TECHNIQUE: Multidetector CT imaging of the chest was performed using the standard protocol during bolus administration of intravenous contrast. Multiplanar CT image reconstructions and MIPs were obtained to evaluate the vascular anatomy. CONTRAST:  185mL ISOVUE-370 IOPAMIDOL (ISOVUE-370) INJECTION 76% COMPARISON:  None. FINDINGS: Cardiovascular: The pulmonary arteries are adequately opacified. There is no evidence of pulmonary embolism. Central pulmonary arteries are normal in caliber. The heart size is normal. No pericardial fluid. Calcified plaque noted in the left main coronary artery and LAD. Calcified plaque in the thoracic aorta without evidence of aortic aneurysm. Mediastinum/Nodes: No enlarged mediastinal, hilar, or axillary lymph nodes. Thyroid gland, trachea, and esophagus demonstrate no significant  findings. There is a small/moderate hiatal hernia. Lungs/Pleura: There is no evidence of pulmonary edema, consolidation, pneumothorax, nodule or pleural fluid. Upper Abdomen: Small 10 mm cyst along the lateral margin of the caudate appears benign. Musculoskeletal: No chest wall abnormality. No acute or significant osseous findings. Review of the MIP images confirms the above findings. IMPRESSION: 1. No evidence of pulmonary embolism. 2. Coronary atherosclerosis with calcified plaque seen in the distribution of the left main coronary artery and LA 3. Aortic atherosclerosis without evidence of aneurysmal disease of the thoracic aorta. 4. Small a moderate sized hiatal hernia. Aortic Atherosclerosis (ICD10-I70.0). Electronically Signed   By: Aletta Edouard M.D.   On: 02/05/2017 13:01    EKG:  Independently reviewed. Atrial fibrillation with RVR (rate 142).   Assessment/Plan  1. Atrial fibrillation with RVR  - Presents with 3 days of severe nausea, found to be in new a fib with rate in 140's  - No angina or troponin elevation to suggest an ischemic etiology; CTA chest neg for PE  - Treated with diltiazem infusion in ED, converted sinus rhythm  - CHADS-VASc is 44 (age x2, gender, HTN)  - Check thyroid studies, correct electrolyte derangements, continue cardiac monitoring, continue Xarelto   2. Hyponatremia  - Serum sodium is 122 on admission  - Likely secondary to chlorthalidone, will stop  - Check TSH and urine sodium, follow serial chem panels - Was treated with 1 liter NS in ED, will SLIV for now pending repeat chem panel   3. Hypokalemia  - Serum potassium is 3.1 on admission  - Treated in ED with 40 mEq oral potassium  - Continue cardiac monitoring, repeat chem panel    4. Hypertension  - BP at goal  - Continue atenolol, stop chlorthalidone in light of electrolyte derangements     DVT prophylaxis: Xarelto  Code Status: Full  Family Communication: Discussed with patient Disposition Plan: Admit to telemetry Consults called: Cardiology Admission status: Inpatient    Vianne Bulls, MD [facility] Pager [phone]  If 7PM-7AM, please contact night-coverage [email or web address] Password [facility]  02/05/2017, 9:45 PM

## 2017-02-05 NOTE — Progress Notes (Signed)
Pharm Tech during [place] noted patient stating she was unaware that on 12/29/16 when she picked up her prescription at [facility] that she was prescribed Atenolol Chlorthalidone 50/25mg  1 tab Q day PO instead of her usual Metoprolol 50mg  1 tab Q day PO.  Patient states shewas unaware that her MD [name] had changed her medication.  Patient asks if this could be the reason for her change in heart rhythm.

## 2017-02-05 NOTE — Progress Notes (Signed)
Sent message to [facility] via Amion to ask if they want further orders on chart.

## 2017-02-05 NOTE — ED Notes (Signed)
Remains in CT

## 2017-02-05 NOTE — Progress Notes (Signed)
84 yo female with htn presented to [facility] with 2 days weakness, nausea and syncope. She was noted to be in afib with rvr 150 with + rate related changes, resolved with rate control on Cardizem, now 30 po q 6h. Troponin is negative. D dimer + 150 but CTA neg for PE. Na was treated with NS 1 liter, Urine osm is pending.

## 2017-02-05 NOTE — ED Notes (Signed)
ED Provider at bedside. 

## 2017-02-05 NOTE — ED Notes (Signed)
Patient transported to CT 

## 2017-02-05 NOTE — Progress Notes (Addendum)
Patient is being seen by [facility].  Sent message regarding orders, asking if they want to add orders.   This is INCORRECT.  Patient being seen by [facility] as leter discovered and [facility].

## 2017-02-05 NOTE — ED Triage Notes (Signed)
Patient states that she has had nausea and extreme thirst since wed. Patient has been unable to eat and drink at all since then Patient has been unable to take her medications

## 2017-02-05 NOTE — Progress Notes (Signed)
Patient arrives to three [place], c/a/ox3, ambulates independently from stretcher to bed. Pt denies complaints at this time.

## 2017-02-05 NOTE — Progress Notes (Signed)
ANTICOAGULATION CONSULT NOTE - Initial Consult  Pharmacy Consult for Xarelto Indication: atrial fibrillation  Allergies  Allergen Reactions  . Latex Rash    Patient Measurements: Height: 5\' 7"  (170.2 cm) Weight: 173 lb (78.5 kg) IBW/kg (Calculated) : 61.6  Vital Signs: Temp: 97.8 F (36.6 C) (01/12 1028) Temp Source: Oral (01/12 1028) BP: 116/96 (01/12 1342) Pulse Rate: 70 (01/12 1245)  Labs: Recent Labs    02/05/17 1049  HGB 12.0  HCT 34.2*  PLT 184  LABPROT 12.7  INR 0.96  CREATININE 1.12*  TROPONINI <0.03    Estimated Creatinine Clearance: 42.5 mL/min (A) (by C-G formula based on SCr of 1.12 mg/dL (H)).   Medical History: Past Medical History:  Diagnosis Date  . Gout   . Hypertension     Assessment: CC/HPI: Nausea, weakness, new afib Electrolytes: Na 122, K=3.1, Cl 85, Scr 1.12 D dimer elevated at 1.21, however CTA was negative for PE. Troponin negative.  PMH: HTN, gout  Anticoag: Baseline Hgb 12. Plts 184. Start Xarelto for afib. 84 y/o, 78.5kg, Scr 1.12, CrCl <50  Goal of Therapy:  Therapeutic oral anticoagulation   Plan:  Xarelto 15mg  q supper for CrCl<15   Shaheed Schmuck S. Alford Highland, PharmD, BCPS Clinical Staff Pharmacist Pager [phone]  Eilene Ghazi Stillinger 02/05/2017,2:13 PM

## 2017-02-05 NOTE — ED Notes (Signed)
Attempted to call report. RN is currently busy.

## 2017-02-05 NOTE — Progress Notes (Addendum)
Critical Result 23:00 received from lab and notified [facility], Dr. Myna Hidalgo Potassium 2.7.

## 2017-02-05 NOTE — ED Provider Notes (Signed)
[facility] EMERGENCY DEPARTMENT Provider Note   CSN: [id number] Arrival date & time: 02/05/17  1017     History   Chief Complaint Chief Complaint  Patient presents with  . Nausea    HPI Amber Kennedy is a 84 y.o. female with history of HTN who presents to [facility] with nausea of two days duration, found to be in new onset afib with RVR. Patient was in her usual state of health until 2 days ago when she developed nausea and no vomiting,  associated feelings of weakness. She does endorse one episode of loss of consciousness 2 days ago with no associated fall or trauma. Patient and her daughter at bedside endorses that she has been recently dehydrated and had increased thirst. No history of diabetes.  Patient denies history of blood clot, chest pain, shortness of breath, or leg swelling, and no recent travel. Patient denies history of lung disease, however she is a former smoker. Denies sleep apnea/does not sleep with a BiPAP machine. She denies recent infection, fevers, or illness.   Past Medical History:  Diagnosis Date  . Gout   . Hypertension     Patient Active Problem List   Diagnosis Date Noted  . Atrial fibrillation with RVR ([place]) 02/05/2017    History reviewed. No pertinent surgical history.  OB History    No data available       Home Medications    Prior to Admission medications   Medication Sig Start Date End Date Taking? Authorizing Provider  allopurinol (ZYLOPRIM) 100 MG tablet Take 100 mg by mouth daily.   Yes [provider]  metoprolol succinate (TOPROL-XL) 50 MG 24 hr tablet Take 50 mg by mouth daily. Take with or immediately following a meal.   Yes [provider]    Family History No family history on file.  Social History Social History   Tobacco Use  . Smoking status: Never Smoker  . Smokeless tobacco: Never Used  Substance Use Topics  . Alcohol use: No    Frequency: Never  . Drug use: No     Allergies    Latex   Review of Systems Review of Systems  Constitutional: Positive for fatigue. Negative for chills, diaphoresis and fever.       +generalized weakness  HENT: Negative for congestion, rhinorrhea and sore throat.   Respiratory: Negative for shortness of breath and wheezing.   Cardiovascular: Negative for chest pain, palpitations and leg swelling.  Gastrointestinal: Negative for abdominal distention, abdominal pain, constipation, diarrhea and nausea.  Endocrine: Positive for polydipsia.  Genitourinary: Positive for urgency.  Neurological: Negative for dizziness and headaches.  Psychiatric/Behavioral: Negative for confusion.     Physical Exam Updated Vital Signs BP (!) 116/96   Pulse 70   Temp 97.8 F (36.6 C) (Oral)   Resp 14   Ht 5\' 7"  (1.702 m)   Wt 78.5 kg (173 lb)   SpO2 93%   BMI 27.10 kg/m   Physical Exam  Constitutional: She is oriented to person, place, and time. She appears well-developed and well-nourished. No distress.  HENT:  Head: Normocephalic and atraumatic.  Neck: Neck supple.  Cardiovascular: Exam reveals no gallop and no friction rub.  No murmur heard. +tachycardia, +irregularly irregular  Pulmonary/Chest: Effort normal and breath sounds normal. No respiratory distress. She has no wheezes. She has no rales.  Abdominal: Soft. She exhibits no distension. There is no tenderness.  Musculoskeletal: She exhibits no edema or tenderness.  Neurological: She is alert  and oriented to person, place, and time.  Skin: Skin is warm and dry. She is not diaphoretic.  Psychiatric: She has a normal mood and affect. Judgment and thought content normal.     ED Treatments / Results  Labs (all labs ordered are listed, but only abnormal results are displayed) Labs Reviewed  CBC WITH DIFFERENTIAL/PLATELET - Abnormal; Notable for the following components:      Result Value   WBC 12.1 (*)    HCT 34.2 (*)    Neutro Abs 9.6 (*)    All other components within normal  limits  COMPREHENSIVE METABOLIC PANEL - Abnormal; Notable for the following components:   Sodium 122 (*)    Potassium 3.1 (*)    Chloride 85 (*)    Glucose, Bld 129 (*)    Creatinine, Ser 1.12 (*)    Calcium 8.8 (*)    GFR calc non Af Amer 45 (*)    GFR calc Af Amer 52 (*)    All other components within normal limits  D-DIMER, QUANTITATIVE (NOT AT [facility]) - Abnormal; Notable for the following components:   D-Dimer, Quant 1.21 (*)    All other components within normal limits  CBG MONITORING, ED - Abnormal; Notable for the following components:   Glucose-Capillary 142 (*)    All other components within normal limits  TROPONIN I  LIPASE, BLOOD  PROTIME-INR  OSMOLALITY  OSMOLALITY, URINE   EKG  EKG Interpretation  Date/Time:  Saturday February 05 2017 10:45:44 EST Ventricular Rate:  142 PR Interval:    QRS Duration: 93 QT Interval:  336 QTC Calculation: 517 R Axis:   -45 Text Interpretation:  Atrial fibrillation with rapid V-rate LAD, consider left anterior fascicular block Anteroseptal infarct, age indeterminate ST depression, probably rate related No previous ECGs available Confirmed by Wandra Arthurs ([id number]) on 02/05/2017 11:26:50 AM      Radiology Dg Chest 2 View  Result Date: 02/05/2017 CLINICAL DATA:  Weakness and fatigue for the past week. EXAM: CHEST  2 VIEW COMPARISON:  None. FINDINGS: Mildly enlarged cardiac silhouette. Small amount of linear density at the left lung base. Small amount of right pleural thickening or fluid. Unremarkable bones. IMPRESSION: 1. Small amount of linear atelectasis or scarring at the left lung base. 2. Small amount of right pleural thickening or fluid. 3. Mild cardiomegaly Electronically Signed   By: Claudie Revering M.D.   On: 02/05/2017 11:42   Ct Angio Chest Pe W And/or Wo Contrast  Result Date: 02/05/2017 CLINICAL DATA:  Nausea.  Elevated D-dimer. EXAM: CT ANGIOGRAPHY CHEST WITH CONTRAST TECHNIQUE: Multidetector CT imaging of the chest was  performed using the standard protocol during bolus administration of intravenous contrast. Multiplanar CT image reconstructions and MIPs were obtained to evaluate the vascular anatomy. CONTRAST:  154mL ISOVUE-370 IOPAMIDOL (ISOVUE-370) INJECTION 76% COMPARISON:  None. FINDINGS: Cardiovascular: The pulmonary arteries are adequately opacified. There is no evidence of pulmonary embolism. Central pulmonary arteries are normal in caliber. The heart size is normal. No pericardial fluid. Calcified plaque noted in the left main coronary artery and LAD. Calcified plaque in the thoracic aorta without evidence of aortic aneurysm. Mediastinum/Nodes: No enlarged mediastinal, hilar, or axillary lymph nodes. Thyroid gland, trachea, and esophagus demonstrate no significant findings. There is a small/moderate hiatal hernia. Lungs/Pleura: There is no evidence of pulmonary edema, consolidation, pneumothorax, nodule or pleural fluid. Upper Abdomen: Small 10 mm cyst along the lateral margin of the caudate appears benign. Musculoskeletal: No chest wall abnormality. No acute or  significant osseous findings. Review of the MIP images confirms the above findings. IMPRESSION: 1. No evidence of pulmonary embolism. 2. Coronary atherosclerosis with calcified plaque seen in the distribution of the left main coronary artery and LA 3. Aortic atherosclerosis without evidence of aneurysmal disease of the thoracic aorta. 4. Small a moderate sized hiatal hernia. Aortic Atherosclerosis (ICD10-I70.0). Electronically Signed   By: Aletta Edouard M.D.   On: 02/05/2017 13:01    Procedures Procedures (including critical care time)  Medications Ordered in ED Medications  diltiazem (CARDIZEM) 100 mg in dextrose 5 % 100 mL (1 mg/mL) infusion (0 mg/hr Intravenous Stopped 02/05/17 1344)  diltiazem (CARDIZEM) tablet 30 mg (30 mg Oral Given 02/05/17 1342)  Rivaroxaban (XARELTO) tablet 15 mg (not administered)  diltiazem (CARDIZEM) injection 10 mg (10 mg  Intravenous Given 02/05/17 1057)  sodium chloride 0.9 % bolus 1,000 mL (0 mLs Intravenous Stopped 02/05/17 1157)  iopamidol (ISOVUE-370) 76 % injection 100 mL (100 mLs Intravenous Contrast Given 02/05/17 1226)  potassium chloride SA (K-DUR,KLOR-CON) CR tablet 40 mEq (40 mEq Oral Given 02/05/17 1332)    Initial Impression / Assessment and Plan / ED Course  I have reviewed the triage vital signs and the nursing notes.  Pertinent labs & imaging results that were available during my care of the patient were reviewed by me and considered in my medical decision making (see chart for details).     New onset afib 84 year old female presenting with new onset afib, symptoms including generalized fatigue and nausea. Patient given diltiazem 10 mg IV for rate control and then started on a diltiazem gtt with rates controlled at 5 ml/hr. Uncertain precipitating cause. D dimer elevated at 1.21, however CTA was negative for PE, however did show calcification in the LAD. Troponin negative. Cardiology was consulted. Initiated xarelto and 30 mg cardizem q6H to discontinue gtt. Admitting team called for rate control and inpatient management of hyponatremia.  Hyponatremia She was additionally noted to be hyponatremic to 122. She received 1000 mg bolus in the ED. May be symptomatic with nausea and fatigue, mixed picture with new onset afib.  Hypokalemia Potassium at 3.1 was repleted with 40 mg KDUR  Final Clinical Impressions(s) / ED Diagnoses   Final diagnoses:  Atrial fibrillation with RVR [facility])  Hyponatremia    ED Discharge Orders    None       Everrett Coombe, MD 02/05/17 1422    Drenda Freeze, MD 02/06/17 [phone]

## 2017-02-06 ENCOUNTER — Inpatient Hospital Stay (HOSPITAL_BASED_OUTPATIENT_CLINIC_OR_DEPARTMENT_OTHER): Payer: Medicare HMO

## 2017-02-06 DIAGNOSIS — I2510 Atherosclerotic heart disease of native coronary artery without angina pectoris: Secondary | ICD-10-CM | POA: Diagnosis not present

## 2017-02-06 DIAGNOSIS — E876 Hypokalemia: Secondary | ICD-10-CM

## 2017-02-06 DIAGNOSIS — R7989 Other specified abnormal findings of blood chemistry: Secondary | ICD-10-CM | POA: Diagnosis not present

## 2017-02-06 DIAGNOSIS — I4891 Unspecified atrial fibrillation: Secondary | ICD-10-CM

## 2017-02-06 DIAGNOSIS — I361 Nonrheumatic tricuspid (valve) insufficiency: Secondary | ICD-10-CM | POA: Diagnosis not present

## 2017-02-06 DIAGNOSIS — I10 Essential (primary) hypertension: Secondary | ICD-10-CM

## 2017-02-06 DIAGNOSIS — E871 Hypo-osmolality and hyponatremia: Secondary | ICD-10-CM

## 2017-02-06 LAB — BASIC METABOLIC PANEL
Anion gap: 11 (ref 5–15)
Anion gap: 11 (ref 5–15)
Anion gap: 12 (ref 5–15)
BUN: 15 mg/dL (ref 6–20)
BUN: 15 mg/dL (ref 6–20)
BUN: 13 mg/dL (ref 6–20)
CO2: 24 mmol/L (ref 22–32)
CO2: 24 mmol/L (ref 22–32)
CO2: 22 mmol/L (ref 22–32)
Calcium: 8.6 mg/dL — ABNORMAL LOW (ref 8.9–10.3)
Calcium: 8.5 mg/dL — ABNORMAL LOW (ref 8.9–10.3)
Calcium: 8.9 mg/dL (ref 8.9–10.3)
Chloride: 91 mmol/L — ABNORMAL LOW (ref 101–111)
Chloride: 92 mmol/L — ABNORMAL LOW (ref 101–111)
Chloride: 94 mmol/L — ABNORMAL LOW (ref 101–111)
Creatinine, Ser: 0.97 mg/dL (ref 0.44–1.00)
Creatinine, Ser: 1.08 mg/dL — ABNORMAL HIGH (ref 0.44–1.00)
Creatinine, Ser: 0.9 mg/dL (ref 0.44–1.00)
GFR calc Af Amer: >60 mL/min (ref >60)
GFR calc Af Amer: 54 mL/min — ABNORMAL LOW (ref >60)
GFR calc Af Amer: >60 mL/min (ref >60)
GFR calc non Af Amer: 53 mL/min — ABNORMAL LOW (ref >60)
GFR calc non Af Amer: 47 mL/min — ABNORMAL LOW (ref >60)
GFR calc non Af Amer: 58 mL/min — ABNORMAL LOW (ref >60)
Glucose, Bld: 103 mg/dL — ABNORMAL HIGH (ref 65–99)
Glucose, Bld: 132 mg/dL — ABNORMAL HIGH (ref 65–99)
Glucose, Bld: 112 mg/dL — ABNORMAL HIGH (ref 65–99)
Potassium: 2.9 mmol/L — ABNORMAL LOW (ref 3.5–5.1)
Potassium: 2.8 mmol/L — ABNORMAL LOW (ref 3.5–5.1)
Potassium: 3.2 mmol/L — ABNORMAL LOW (ref 3.5–5.1)
Sodium: 126 mmol/L — ABNORMAL LOW (ref 135–145)
Sodium: 127 mmol/L — ABNORMAL LOW (ref 135–145)
Sodium: 128 mmol/L — ABNORMAL LOW (ref 135–145)

## 2017-02-06 LAB — CBC
HCT: 30.6 % — ABNORMAL LOW (ref 36.0–46.0)
Hemoglobin: 10.6 g/dL — ABNORMAL LOW (ref 12.0–15.0)
MCH: 29.9 pg (ref 26.0–34.0)
MCHC: 34.6 g/dL (ref 30.0–36.0)
MCV: 86.4 fL (ref 78.0–100.0)
Platelets: 182 10*3/uL (ref 150–400)
RBC: 3.54 MIL/uL — ABNORMAL LOW (ref 3.87–5.11)
RDW: 13.2 % (ref 11.5–15.5)
WBC: 7.2 10*3/uL (ref 4.0–10.5)

## 2017-02-06 LAB — SODIUM, URINE, RANDOM: Sodium, Ur: 10 mmol/L

## 2017-02-06 LAB — ECHOCARDIOGRAM COMPLETE
Height: 67 in
Weight: 2867.2 oz

## 2017-02-06 LAB — MAGNESIUM: Magnesium: 1.8 mg/dL (ref 1.7–2.4)

## 2017-02-06 MED ORDER — POTASSIUM CHLORIDE IN NACL 20-0.9 MEQ/L-% IV SOLN
INTRAVENOUS | Status: DC
  Administered 2017-02-06 (×2): via INTRAVENOUS
  Filled 2017-02-06 (×3): qty 1000

## 2017-02-06 MED ORDER — SODIUM CHLORIDE 0.9 % IV SOLN: INTRAVENOUS | Status: DC

## 2017-02-06 MED ORDER — MAGNESIUM SULFATE 2 GM/50ML IV SOLN
2.0000 g | Freq: Once | INTRAVENOUS | Status: AC
  Administered 2017-02-06: 2 g via INTRAVENOUS
  Filled 2017-02-06: qty 50

## 2017-02-06 MED ORDER — METOPROLOL TARTRATE 5 MG/5ML IV SOLN
5.0000 mg | Freq: Once | INTRAVENOUS | Status: AC
  Administered 2017-02-06: 5 mg via INTRAVENOUS
  Filled 2017-02-06: qty 5

## 2017-02-06 MED ORDER — RIVAROXABAN 20 MG PO TABS
20.0000 mg | ORAL_TABLET | Freq: Every day | ORAL | Status: DC
  Administered 2017-02-06: 20 mg via ORAL
  Filled 2017-02-06: qty 1

## 2017-02-06 MED ORDER — METOPROLOL TARTRATE 25 MG PO TABS
25.0000 mg | ORAL_TABLET | Freq: Two times a day (BID) | ORAL | Status: DC
  Administered 2017-02-06 – 2017-02-07 (×3): 25 mg via ORAL
  Filled 2017-02-06 (×3): qty 1

## 2017-02-06 MED ORDER — POTASSIUM CHLORIDE CRYS ER 20 MEQ PO TBCR: 40.0000 meq | EXTENDED_RELEASE_TABLET | Freq: Two times a day (BID) | ORAL | Status: DC

## 2017-02-06 MED ORDER — POTASSIUM CHLORIDE CRYS ER 20 MEQ PO TBCR
40.0000 meq | EXTENDED_RELEASE_TABLET | Freq: Two times a day (BID) | ORAL | Status: AC
  Administered 2017-02-06: 40 meq via ORAL
  Filled 2017-02-06: qty 2

## 2017-02-06 MED ORDER — POTASSIUM CHLORIDE CRYS ER 20 MEQ PO TBCR
40.0000 meq | EXTENDED_RELEASE_TABLET | Freq: Once | ORAL | Status: AC
  Administered 2017-02-06: 40 meq via ORAL
  Filled 2017-02-06: qty 2

## 2017-02-06 NOTE — Progress Notes (Signed)
Progress Note  Patient Name: Amber Kennedy Date of Encounter: 02/06/2017  Primary Cardiologist: [facility] (Dr. Bronson Ing)  Subjective   Feeling well. Slept well. Daughter in room. Patient denies chest pain, palpitations, leg swelling, and shortness of breath. She believes she passed out for a few seconds yesterday. Daughter says dehydration may have been playing a role over the past few days.  Inpatient Medications    Scheduled Meds: . allopurinol  100 mg Oral Daily  . metoprolol tartrate  25 mg Oral BID  . rivaroxaban  15 mg Oral Q supper  . sodium chloride flush  3 mL Intravenous Q12H   Continuous Infusions: . 0.9 % NaCl with KCl 20 mEq / L 75 mL/hr at 02/06/17 0948  . magnesium sulfate 1 - 4 g bolus IVPB 2 g (02/06/17 0930)   PRN Meds: acetaminophen **OR** acetaminophen, bisacodyl, HYDROcodone-acetaminophen, ondansetron **OR** ondansetron (ZOFRAN) IV, senna-docusate   Vital Signs    Vitals:   02/05/17 1934 02/06/17 0002 02/06/17 0457 02/06/17 0941  BP: (!) 117/50 (!) 122/44 131/62 (!) 125/59  Pulse: 66 69 65 71  Resp: 18 18 18    Temp: 97.9 F (36.6 C) 97.8 F (36.6 C) 97.7 F (36.5 C)   TempSrc: Oral Oral Oral   SpO2: 98% 97% 99%   Weight:   179 lb 3.2 oz (81.3 kg)   Height:        Intake/Output Summary (Last 24 hours) at 02/06/2017 1006 Last data filed at 02/06/2017 3704 Gross per 24 hour  Intake 208.58 ml  Output 1200 ml  Net -991.42 ml   Filed Weights   02/05/17 1027 02/06/17 0457  Weight: 173 lb (78.5 kg) 179 lb 3.2 oz (81.3 kg)    Telemetry    Sinus rhythm with PAC's and short atrial runs, no atrial fibrillation - Personally Reviewed  ECG    n/a - Personally Reviewed  Physical Exam   GEN: No acute distress.   Neck: No JVD Cardiac: RRR, no murmurs, rubs, or gallops.  Respiratory: Clear to auscultation bilaterally. GI: Soft, nontender, non-distended  MS: No edema; No deformity. Neuro:  Nonfocal  Psych: Normal affect   Labs     Chemistry Recent Labs  Lab 02/05/17 1049 02/05/17 2209 02/06/17 0520  NA 122* 125* 126*  K 3.1* 2.7* 2.9*  CL 85* 90* 91*  CO2 23 23 24   GLUCOSE 129* 107* 103*  BUN 17 18 15   CREATININE 1.12* 1.16* 0.97  CALCIUM 8.8* 8.3* 8.6*  PROT 7.1  --   --   ALBUMIN 3.6  --   --   AST 32  --   --   ALT 20  --   --   ALKPHOS 50  --   --   BILITOT 1.0  --   --   GFRNONAA 45* 43* 53*  GFRAA 52* 50* >60  ANIONGAP 14 12 11      Hematology Recent Labs  Lab 02/05/17 1049 02/06/17 0520  WBC 12.1* 7.2  RBC 3.99 3.54*  HGB 12.0 10.6*  HCT 34.2* 30.6*  MCV 85.7 86.4  MCH 30.1 29.9  MCHC 35.1 34.6  RDW 12.8 13.2  PLT 184 182    Cardiac Enzymes Recent Labs  Lab 02/05/17 1049  TROPONINI <0.03   No results for input(s): TROPIPOC in the last 168 hours.   BNPNo results for input(s): BNP, PROBNP in the last 168 hours.   DDimer  Recent Labs  Lab 02/05/17 1049  DDIMER 1.21*  Radiology    Dg Chest 2 View  Result Date: 02/05/2017 CLINICAL DATA:  Weakness and fatigue for the past week. EXAM: CHEST  2 VIEW COMPARISON:  None. FINDINGS: Mildly enlarged cardiac silhouette. Small amount of linear density at the left lung base. Small amount of right pleural thickening or fluid. Unremarkable bones. IMPRESSION: 1. Small amount of linear atelectasis or scarring at the left lung base. 2. Small amount of right pleural thickening or fluid. 3. Mild cardiomegaly Electronically Signed   By: Claudie Revering M.D.   On: 02/05/2017 11:42   Ct Angio Chest Pe W And/or Wo Contrast  Result Date: 02/05/2017 CLINICAL DATA:  Nausea.  Elevated D-dimer. EXAM: CT ANGIOGRAPHY CHEST WITH CONTRAST TECHNIQUE: Multidetector CT imaging of the chest was performed using the standard protocol during bolus administration of intravenous contrast. Multiplanar CT image reconstructions and MIPs were obtained to evaluate the vascular anatomy. CONTRAST:  176mL ISOVUE-370 IOPAMIDOL (ISOVUE-370) INJECTION 76% COMPARISON:  None.  FINDINGS: Cardiovascular: The pulmonary arteries are adequately opacified. There is no evidence of pulmonary embolism. Central pulmonary arteries are normal in caliber. The heart size is normal. No pericardial fluid. Calcified plaque noted in the left main coronary artery and LAD. Calcified plaque in the thoracic aorta without evidence of aortic aneurysm. Mediastinum/Nodes: No enlarged mediastinal, hilar, or axillary lymph nodes. Thyroid gland, trachea, and esophagus demonstrate no significant findings. There is a small/moderate hiatal hernia. Lungs/Pleura: There is no evidence of pulmonary edema, consolidation, pneumothorax, nodule or pleural fluid. Upper Abdomen: Small 10 mm cyst along the lateral margin of the caudate appears benign. Musculoskeletal: No chest wall abnormality. No acute or significant osseous findings. Review of the MIP images confirms the above findings. IMPRESSION: 1. No evidence of pulmonary embolism. 2. Coronary atherosclerosis with calcified plaque seen in the distribution of the left main coronary artery and LA 3. Aortic atherosclerosis without evidence of aneurysmal disease of the thoracic aorta. 4. Small a moderate sized hiatal hernia. Aortic Atherosclerosis (ICD10-I70.0). Electronically Signed   By: Aletta Edouard M.D.   On: 02/05/2017 13:01    Cardiac Studies   Echocardiogram pending  Patient Profile     84 y.o. female with a h/o HTN admitted with new-onset atrial fibrillation, hypokalemia, syncope, and hyponatremia.  Assessment & Plan    1. New onset atrial fibrillation: She remains in sinus rhythm. PAC's noted on telemetry in the setting of hyponatremia and hypokalemia. TSH also mildly elevated.  Currently on Xarelto, started at [place]. CHADSVASC at least 4. However, if arrhythmia was triggered by metabolic abnormalities, one could consider placing an outpatient event monitor to evaluate for recurrences. If none, consider stopping anticoagulation  (particularly given h/o possible prior retinal hemorrhage). Continue metoprolol. I agree with stopping chlorthalidone as this likely contributed to metabolic abnormalities.  2. Hypokalemia: This is being replaced. I agree with stopping chlorthalidone as this likely contributed to metabolic abnormalities.  3. Hyponatremia: Possibly secondary to dehydration. I agree with stopping chlorthalidone as this likely contributed to metabolic abnormalities.  4. Hypertension: Controlled. No changes.  5. Coronary artery calcifications: Noted in left main and LAD. No anginal symptoms. If she were to have suspicious symptoms in future, an outpatient stress test could be considered.    For questions or updates, please contact [place] Please consult [email or web address] for contact info under Cardiology/STEMI.      Signed, Kate Sable, MD  02/06/2017, 10:06 AM

## 2017-02-06 NOTE — Care Management Obs Status (Signed)
[place] NOTIFICATION   Patient Details  Name: GEETA DWORKIN MRN: [id number] Date of Birth: 03-Jan-1934   Medicare Observation Status Notification Given:  Yes    Dawayne Patricia, RN 02/06/2017, 3:24 PM

## 2017-02-06 NOTE — Progress Notes (Signed)
ANTICOAGULATION CONSULT NOTE - [place] for Xarelto Indication: atrial fibrillation  Allergies  Allergen Reactions  . Penicillin G Hives  . Latex Rash    "Bandaids"     Patient Measurements: Height: 5\' 7"  (170.2 cm) Weight: 179 lb 3.2 oz (81.3 kg)(scale c) IBW/kg (Calculated) : 61.6  Vital Signs: Temp: 97.8 F (36.6 C) (01/13 1100) Temp Source: Axillary (01/13 1100) BP: 130/53 (01/13 1100) Pulse Rate: 62 (01/13 1100)  Labs: Recent Labs    02/05/17 1049 02/05/17 2209 02/06/17 0520 02/06/17 0832  HGB 12.0  --  10.6*  --   HCT 34.2*  --  30.6*  --   PLT 184  --  182  --   LABPROT 12.7  --   --   --   INR 0.96  --   --   --   CREATININE 1.12* 1.16* 0.97 1.08*  TROPONINI <0.03  --   --   --     Estimated Creatinine Clearance: 44.8 mL/min (A) (by C-G formula based on SCr of 1.08 mg/dL (H)).   Medical History: Past Medical History:  Diagnosis Date  . Gout   . Hypertension     Assessment: 23 yof found with new Afib. Started on Xarelto at [facility]. Was found to have positive D dimer (1.21) - however, CTA negative for PE. Received 15 mg on 1/12 at 1426. Scr was 1.12; however, improved to 1.08 today. Using total body weight, CrCl is 52 mL/min.   Hgb down from 12 to 10.6 today, platelets stable. No signs/symptoms of bleeding.   Goal of Therapy:  Therapeutic oral anticoagulation   Plan:  Xarelto 20 mg q supper Monitor CBC and renal function  Doylene Canard, PharmD Clinical Pharmacist  Pager: [phone] Clinical Phone for 02/06/2017 until 3:30pm: [phone] If after 3:30pm, please call main pharmacy at [phone] 02/06/2017,1:47 PM

## 2017-02-06 NOTE — Progress Notes (Signed)
Pt is alert and oriented daughter concerned about low potassium paged Md and will give a extra PO dose and 5mg  lopressor IV

## 2017-02-06 NOTE — Progress Notes (Signed)
PROGRESS NOTE  Amber Kennedy HAL:[id number] DOB: 1933-05-11 DOA: 02/05/2017 PCP: [name], MD  HPI/Recap of past 24 hours: HPI from Dr Mitzi Hansen on 02/05/17 Amber Kennedy is a 84 y.o. female with medical history significant for hypertension, now presenting to the ER for evaluation of severe nausea.  Patient reports that she had been in her usual state of health until approximately 3 days ago when she noted the fairly acute development of severe nausea without abdominal pain and without vomiting or diarrhea. There was no chest pain, palpitations, dyspnea, fevers, or chills associated with this. In [place] ED pt was noted to be in Afib with RVR also noted a sodium of 122. Patient was given diltiazem and started on Xarelto. Patient is admitted to the telemetry unit for further management.  Today, patient reported feeling better, reported nausea has resolved, denied any chest pain, abdominal pain, shortness of breath, palpitations, fever/chills.  Assessment/Plan: Principal Problem:   Atrial fibrillation with RVR (HCC) Active Problems:   Hypertension   Hyponatremia   Hypokalemia  Atrial fibrillation with RVR  Currently NSR, rate controlled s/p diltiazem gtt On presentation, afib with rate in 140's  Neg troponin, CTA chest neg for PE  ECHO: EF 60-65% Grade 2 DD  CHADS-VASc is 31 (age x2, gender, HTN)  Started lopressor, continueXarelto   Hyponatremia Improving   Na 122 on admission-->127 Likely secondary to chlorthalidone (stopped) + poor oral intake Urine sodium 10 Continue gentle hydration IVF @ 75cc/hr Daily BMP  Hypokalemia  Replace prn  Hypertension  - BP at goal  - Confusion about home BP meds, will clarify with pharmacy, atenolol vs metoprolol  Subclinical hypothyroidism TSH mildly elevated, normal free T4 PCP to repeat/follow up as outpt    Code Status: Full  Family Communication: Daughter at bedside  Disposition Plan: Home once  stable   Consultants:  Cardiology  Procedures:  None  Antimicrobials:  None  DVT prophylaxis: Xarelto   Objective: Vitals:   02/06/17 0002 02/06/17 0457 02/06/17 0941 02/06/17 1100  BP: (!) 122/44 131/62 (!) 125/59 (!) 130/53  Pulse: 69 65 71 62  Resp: 18 18  18   Temp: 97.8 F (36.6 C) 97.7 F (36.5 C)  97.8 F (36.6 C)  TempSrc: Oral Oral  Axillary  SpO2: 97% 99%  100%  Weight:  81.3 kg (179 lb 3.2 oz)    Height:        Intake/Output Summary (Last 24 hours) at 02/06/2017 1509 Last data filed at 02/06/2017 1405 Gross per 24 hour  Intake 443 ml  Output 1900 ml  Net -1457 ml   Filed Weights   02/05/17 1027 02/06/17 0457  Weight: 78.5 kg (173 lb) 81.3 kg (179 lb 3.2 oz)    Exam:   General: Alert, awake, oriented, NAD  Cardiovascular: S1, S2 present, no added heart sounds  Respiratory: Chest clear bilaterally  Abdomen: Soft, nontender, nondistended, bowel sounds present  Musculoskeletal: No pedal edema bilaterally  Skin: Normal  Psychiatry: Normal mood/affect   Data Reviewed: CBC: Recent Labs  Lab 02/05/17 1049 02/06/17 0520  WBC 12.1* 7.2  NEUTROABS 9.6*  --   HGB 12.0 10.6*  HCT 34.2* 30.6*  MCV 85.7 86.4  PLT 184 735   Basic Metabolic Panel: Recent Labs  Lab 02/05/17 1049 02/05/17 2209 02/06/17 0520 02/06/17 0832  NA 122* 125* 126* 127*  K 3.1* 2.7* 2.9* 2.8*  CL 85* 90* 91* 92*  CO2 23 23 24 24   GLUCOSE  129* 107* 103* 132*  BUN 17 18 15 15   CREATININE 1.12* 1.16* 0.97 1.08*  CALCIUM 8.8* 8.3* 8.6* 8.5*  MG  --   --  1.8  --    GFR: Estimated Creatinine Clearance: 44.8 mL/min (A) (by C-G formula based on SCr of 1.08 mg/dL (H)). Liver Function Tests: Recent Labs  Lab 02/05/17 1049  AST 32  ALT 20  ALKPHOS 50  BILITOT 1.0  PROT 7.1  ALBUMIN 3.6   Recent Labs  Lab 02/05/17 1049  LIPASE 18   No results for input(s): AMMONIA in the last 168 hours. Coagulation Profile: Recent Labs  Lab 02/05/17 1049  INR 0.96    Cardiac Enzymes: Recent Labs  Lab 02/05/17 1049  TROPONINI <0.03   BNP (last 3 results) No results for input(s): PROBNP in the last 8760 hours. HbA1C: No results for input(s): HGBA1C in the last 72 hours. CBG: Recent Labs  Lab 02/05/17 1047  GLUCAP 142*   Lipid Profile: No results for input(s): CHOL, HDL, LDLCALC, TRIG, CHOLHDL, LDLDIRECT in the last 72 hours. Thyroid Function Tests: Recent Labs    02/05/17 2209  TSH 6.325*  FREET4 1.21*   Anemia Panel: No results for input(s): VITAMINB12, FOLATE, FERRITIN, TIBC, IRON, RETICCTPCT in the last 72 hours. Urine analysis: No results found for: COLORURINE, APPEARANCEUR, LABSPEC, PHURINE, GLUCOSEU, HGBUR, BILIRUBINUR, KETONESUR, PROTEINUR, UROBILINOGEN, NITRITE, LEUKOCYTESUR Sepsis Labs: @LABRCNTIP (procalcitonin:4,lacticidven:4)  )No results found for this or any previous visit (from the past 240 hour(s)).    Studies: No results found.  Scheduled Meds: . allopurinol  100 mg Oral Daily  . metoprolol tartrate  25 mg Oral BID  . rivaroxaban  20 mg Oral Q supper  . sodium chloride flush  3 mL Intravenous Q12H    Continuous Infusions: . 0.9 % NaCl with KCl 20 mEq / L 75 mL/hr at 02/06/17 0948     LOS: 1 day     Alma Friendly, MD [facility]   If 7PM-7AM, please contact night-coverage [email or web address] Password [facility] 02/06/2017, 3:09 PM

## 2017-02-06 NOTE — Progress Notes (Signed)
  Echocardiogram 2D Echocardiogram has been performed.  Amber Kennedy 02/06/2017, 11:38 AM

## 2017-02-06 NOTE — Care Management CC44 (Signed)
Condition Code 44 Documentation Completed  Patient Details  Name: Amber Kennedy MRN: [id number] Date of Birth: 1934/01/23   Condition Code 44 given:  Yes Patient signature on Condition Code 44 notice:  Yes Documentation of 2 MD's agreement:  Yes Code 44 added to claim:  Yes    Dawayne Patricia, RN 02/06/2017, 3:24 PM

## 2017-02-07 DIAGNOSIS — I10 Essential (primary) hypertension: Secondary | ICD-10-CM | POA: Diagnosis not present

## 2017-02-07 DIAGNOSIS — I4891 Unspecified atrial fibrillation: Secondary | ICD-10-CM | POA: Diagnosis not present

## 2017-02-07 DIAGNOSIS — R002 Palpitations: Secondary | ICD-10-CM | POA: Diagnosis not present

## 2017-02-07 DIAGNOSIS — R110 Nausea: Secondary | ICD-10-CM | POA: Diagnosis not present

## 2017-02-07 DIAGNOSIS — I482 Chronic atrial fibrillation: Secondary | ICD-10-CM | POA: Diagnosis not present

## 2017-02-07 DIAGNOSIS — E871 Hypo-osmolality and hyponatremia: Secondary | ICD-10-CM | POA: Diagnosis not present

## 2017-02-07 DIAGNOSIS — R7989 Other specified abnormal findings of blood chemistry: Secondary | ICD-10-CM | POA: Diagnosis not present

## 2017-02-07 DIAGNOSIS — R55 Syncope and collapse: Secondary | ICD-10-CM | POA: Diagnosis not present

## 2017-02-07 DIAGNOSIS — Z880 Allergy status to penicillin: Secondary | ICD-10-CM | POA: Diagnosis not present

## 2017-02-07 DIAGNOSIS — I2510 Atherosclerotic heart disease of native coronary artery without angina pectoris: Secondary | ICD-10-CM | POA: Diagnosis not present

## 2017-02-07 DIAGNOSIS — E876 Hypokalemia: Secondary | ICD-10-CM | POA: Diagnosis not present

## 2017-02-07 DIAGNOSIS — R946 Abnormal results of thyroid function studies: Secondary | ICD-10-CM | POA: Diagnosis not present

## 2017-02-07 LAB — CBC WITH DIFFERENTIAL/PLATELET
Basophils Absolute: 0.1 10*3/uL (ref 0.0–0.1)
Basophils Relative: 1 %
Eosinophils Absolute: 0.2 10*3/uL (ref 0.0–0.7)
Eosinophils Relative: 2 %
HCT: 32.6 % — ABNORMAL LOW (ref 36.0–46.0)
Hemoglobin: 11 g/dL — ABNORMAL LOW (ref 12.0–15.0)
Lymphocytes Relative: 32 %
Lymphs Abs: 2.7 10*3/uL (ref 0.7–4.0)
MCH: 29.4 pg (ref 26.0–34.0)
MCHC: 33.7 g/dL (ref 30.0–36.0)
MCV: 87.2 fL (ref 78.0–100.0)
Monocytes Absolute: 0.4 10*3/uL (ref 0.1–1.0)
Monocytes Relative: 5 %
Neutro Abs: 5.1 10*3/uL (ref 1.7–7.7)
Neutrophils Relative %: 60 %
Platelets: 215 10*3/uL (ref 150–400)
RBC: 3.74 MIL/uL — ABNORMAL LOW (ref 3.87–5.11)
RDW: 13 % (ref 11.5–15.5)
WBC: 8.5 10*3/uL (ref 4.0–10.5)

## 2017-02-07 LAB — BASIC METABOLIC PANEL
Anion gap: 10 (ref 5–15)
BUN: 12 mg/dL (ref 6–20)
CO2: 23 mmol/L (ref 22–32)
Calcium: 8.7 mg/dL — ABNORMAL LOW (ref 8.9–10.3)
Chloride: 99 mmol/L — ABNORMAL LOW (ref 101–111)
Creatinine, Ser: 0.96 mg/dL (ref 0.44–1.00)
GFR calc Af Amer: >60 mL/min (ref >60)
GFR calc non Af Amer: 54 mL/min — ABNORMAL LOW (ref >60)
Glucose, Bld: 102 mg/dL — ABNORMAL HIGH (ref 65–99)
Potassium: 3.6 mmol/L (ref 3.5–5.1)
Sodium: 132 mmol/L — ABNORMAL LOW (ref 135–145)

## 2017-02-07 MED ORDER — DILTIAZEM HCL ER COATED BEADS 120 MG PO CP24
120.0000 mg | ORAL_CAPSULE | Freq: Every day | ORAL | Status: DC
  Administered 2017-02-07: 120 mg via ORAL
  Filled 2017-02-07: qty 1

## 2017-02-07 MED ORDER — METOPROLOL TARTRATE 25 MG PO TABS: 25.0000 mg | ORAL_TABLET | Freq: Three times a day (TID) | ORAL | Status: DC

## 2017-02-07 MED ORDER — DILTIAZEM HCL 25 MG/5ML IV SOLN
10.0000 mg | Freq: Once | INTRAVENOUS | Status: AC
  Administered 2017-02-07: 10 mg via INTRAVENOUS
  Filled 2017-02-07: qty 5

## 2017-02-07 MED ORDER — RIVAROXABAN 20 MG PO TABS: 20.0000 mg | ORAL_TABLET | Freq: Every day | ORAL | 0 refills | Status: DC

## 2017-02-07 MED ORDER — DILTIAZEM HCL ER COATED BEADS 120 MG PO CP24: 120.0000 mg | ORAL_CAPSULE | Freq: Every day | ORAL | 0 refills | Status: DC

## 2017-02-07 NOTE — Progress Notes (Signed)
Discharge to home, family at bedside.D/c instructions and follow up appointments discussed with patient and family verbalized understanding. PIV removed no s/s of swelling noted. Discharged on NSR rate on  The 70's.

## 2017-02-07 NOTE — Progress Notes (Signed)
Cardiologist paged no response no new orders at this time.

## 2017-02-07 NOTE — Plan of Care (Signed)
  Education: Knowledge of General Education information will improve 02/07/2017 0243 - Progressing by Rolm Baptise, RN   Health Behavior/Discharge Planning: Ability to manage health-related needs will improve 02/07/2017 0243 - Progressing by Rolm Baptise, RN   Elimination: Will not experience complications related to bowel motility 02/07/2017 0243 - Progressing by Rolm Baptise, RN   Pain Managment: General experience of comfort will improve 2/68/3419 6222 - Not Applicable by Rolm Baptise, RN   Safety: Ability to remain free from injury will improve 02/07/2017 0243 - Progressing by Rolm Baptise, RN   Skin Integrity: Risk for impaired skin integrity will decrease 9/79/8921 1941 - Not Applicable by Rolm Baptise, RN

## 2017-02-07 NOTE — Progress Notes (Signed)
Pt is asleep. HR ranging 80s to 140s non sustaining MD notified and EKG performed.

## 2017-02-07 NOTE — Progress Notes (Signed)
Pt and daughter is at bedside is very guarded with care. Daughter is very anxious and Patient  Heart rate goes from 80-120's. After IV metoprolol.

## 2017-02-07 NOTE — Progress Notes (Signed)
Pt is alert and oriented with daughter at bedside and very supportive. Very upset about been awaken in the middle of the night. HR went up higher Heart Rate is A-fib RVR. MD aware and plan to order Cardizem 10mg  IV push.

## 2017-02-07 NOTE — Consult Note (Signed)
   [facility] CM Inpatient Consult   02/07/2017  Hilma Steinhilber [facility] 05/11/1933 [id number]  Patient screened for potential [place] Management services. Patient is in the [place] of the [place] Management services under patient's [facility] plan. Met with the patient and family members at the bedside.  Patient approved for information to be given infront of family regarding [facility] Care Management. Patient ready for discharge family denies any issues for transportation, medication issues or follow up needs.  She endorses Dr. Marisue Humble as her primary care provider. His office is listed for transition of care follow up. They accepted the brochure, 24 hour nurse advise line and contact information.  They verbalized understanding and encouraged to call for needs.   For questions contact:   [facility] Brood, RN BSN [facility] Liaison  [phone] business mobile phone Toll free office [phone]

## 2017-02-07 NOTE — Discharge Summary (Signed)
Discharge Summary  Amber Kennedy:[id number] DOB: Dec 08, 1933  PCP: [name], MD  Admit date: 02/05/2017 Discharge date: 02/07/2017  Time spent: < 30 mins  Recommendations for Outpatient Follow-up:  1. PCP 2. Cardiology  Discharge Diagnoses:  Active Hospital Problems   Diagnosis Date Noted  . Atrial fibrillation with RVR ([place]) 02/05/2017  . Hypertension 02/05/2017  . Hyponatremia 02/05/2017  . Hypokalemia 02/05/2017    Resolved Hospital Problems  No resolved problems to display.    Discharge Condition: Stable  Diet recommendation: Heart healthy  Vitals:   02/07/17 0339 02/07/17 0922  BP: 113/62 (!) 115/38  Pulse:  72  Resp:  18  Temp:  98.5 F (36.9 C)  SpO2:  97%    History of present illness:  Amber Kennedy a 84 y.o.femalewith medical history significant forhypertension, now presenting to the ER for evaluation of severe nausea. Patient reports that she had been in her usual state of health until approximately 3 days ago when she noted the fairly acute development of severe nausea without abdominal pain and without vomiting or diarrhea. There was no chest pain, palpitations, dyspnea, fevers, or chills associated with this. In [place] ED pt was noted to be in Afib with RVR also noted a sodium of 122. Patient was given diltiazem and started on Xarelto. Patient is admitted to the telemetry unit for further management.  Today, patient reports feeling better, denies any chest pain, shortness of breath, palpitations, fever/chills, nausea/vomiting.  Patient is stable for discharge  Hospital Course:  Principal Problem:   Atrial fibrillation with RVR ([place]) Active Problems:   Hypertension   Hyponatremia   Hypokalemia  Atrial fibrillation with RVR Still in Afib, but rate controlled s/p diltiazem ggt/IV On presentation, afib with rate in 140's Neg troponin, CTA chest neg for PE  ECHO: EF 60-65% Grade 2 DD  CHADS-VASc is 31 (age x2, gender,  HTN) Started on PO diltiazem 120 mg, continue Xarelto Cardiology follow up  Hyponatremia Resolved  Na 122 on admission-->132 Likely secondary to chlorthalidone (stopped) + poor oral intake Urine sodium 10 S/P gentle hydration, PCP to follow up.  Hypokalemia Replace prn  Hypertension -BP at goal Stop atenolol/chlorthalidone, start PO diltiazem  Subclinical hypothyroidism TSH mildly elevated, normal free T4 PCP to repeat/follow up as outpt    Procedures:  None  Consultations:  Cardiology  Discharge Exam: BP (!) 115/38 (BP Location: Left Arm)   Pulse 72   Temp 98.5 F (36.9 C) (Oral)   Resp 18   Ht 5\' 7"  (1.702 m)   Wt 81.3 kg (179 lb 3.2 oz) Comment: scale c  SpO2 97%   BMI 28.07 kg/m   General: Alert, awake, not in acute distress Cardiovascular: Irregular rate and rhythm, no added heart sounds Respiratory: Chest clear bilaterally  Discharge Instructions You were cared for by a hospitalist during your hospital stay. If you have any questions about your discharge medications or the care you received while you were in the hospital after you are discharged, you can call the unit and asked to speak with the hospitalist on call if the hospitalist that took care of you is not available. Once you are discharged, your primary care physician will handle any further medical issues. Please note that NO REFILLS for any discharge medications will be authorized once you are discharged, as it is imperative that you return to your primary care physician (or establish a relationship with a primary care physician if you do not have  one) for your aftercare needs so that they can reassess your need for medications and monitor your lab values.  Discharge Instructions    Diet - low sodium heart healthy   Complete by:  As directed    Increase activity slowly   Complete by:  As directed      Allergies as of 02/07/2017      Reactions   Penicillin G Hives   Latex Rash    "Bandaids"      Medication List    STOP taking these medications   atenolol-chlorthalidone 50-25 MG tablet Commonly known as:  TENORETIC   metoprolol succinate 50 MG 24 hr tablet Commonly known as:  TOPROL-XL     TAKE these medications   allopurinol 100 MG tablet Commonly known as:  ZYLOPRIM Take 100 mg by mouth daily.   diltiazem 120 MG 24 hr capsule Commonly known as:  CARDIZEM CD Take 1 capsule (120 mg total) by mouth daily.   rivaroxaban 20 MG Tabs tablet Commonly known as:  XARELTO Take 1 tablet (20 mg total) by mouth daily with supper.      Allergies  Allergen Reactions  . Penicillin G Hives  . Latex Rash    "Bandaids"    Follow-up Information    [name], MD. Go on 02/16/2017.   Specialty:  Family Medicine Why:  @3 :45pm Contact information: [address] [place] [address] [phone]        Jerline Pain, MD. Go on 03/18/2017.   Specialty:  Cardiology Why:  our office will call you with a hospital f/u visit with Dr. Marlou Porch or his PA/NP in 2 weeks@9 :00am Contact information: [address] [place] [address] [phone]            The results of significant diagnostics from this hospitalization (including imaging, microbiology, ancillary and laboratory) are listed below for reference.    Significant Diagnostic Studies: Dg Chest 2 View  Result Date: 02/05/2017 CLINICAL DATA:  Weakness and fatigue for the past week. EXAM: CHEST  2 VIEW COMPARISON:  None. FINDINGS: Mildly enlarged cardiac silhouette. Small amount of linear density at the left lung base. Small amount of right pleural thickening or fluid. Unremarkable bones. IMPRESSION: 1. Small amount of linear atelectasis or scarring at the left lung base. 2. Small amount of right pleural thickening or fluid. 3. Mild cardiomegaly Electronically Signed   By: Claudie Revering M.D.   On: 02/05/2017 11:42   Ct Angio Chest Pe W And/or Wo Contrast  Result  Date: 02/05/2017 CLINICAL DATA:  Nausea.  Elevated D-dimer. EXAM: CT ANGIOGRAPHY CHEST WITH CONTRAST TECHNIQUE: Multidetector CT imaging of the chest was performed using the standard protocol during bolus administration of intravenous contrast. Multiplanar CT image reconstructions and MIPs were obtained to evaluate the vascular anatomy. CONTRAST:  116mL ISOVUE-370 IOPAMIDOL (ISOVUE-370) INJECTION 76% COMPARISON:  None. FINDINGS: Cardiovascular: The pulmonary arteries are adequately opacified. There is no evidence of pulmonary embolism. Central pulmonary arteries are normal in caliber. The heart size is normal. No pericardial fluid. Calcified plaque noted in the left main coronary artery and LAD. Calcified plaque in the thoracic aorta without evidence of aortic aneurysm. Mediastinum/Nodes: No enlarged mediastinal, hilar, or axillary lymph nodes. Thyroid gland, trachea, and esophagus demonstrate no significant findings. There is a small/moderate hiatal hernia. Lungs/Pleura: There is no evidence of pulmonary edema, consolidation, pneumothorax, nodule or pleural fluid. Upper Abdomen: Small 10 mm cyst along the lateral margin of the caudate appears benign.  Musculoskeletal: No chest wall abnormality. No acute or significant osseous findings. Review of the MIP images confirms the above findings. IMPRESSION: 1. No evidence of pulmonary embolism. 2. Coronary atherosclerosis with calcified plaque seen in the distribution of the left main coronary artery and LA 3. Aortic atherosclerosis without evidence of aneurysmal disease of the thoracic aorta. 4. Small a moderate sized hiatal hernia. Aortic Atherosclerosis (ICD10-I70.0). Electronically Signed   By: Aletta Edouard M.D.   On: 02/05/2017 13:01    Microbiology: No results found for this or any previous visit (from the past 240 hour(s)).   Labs: Basic Metabolic Panel: Recent Labs  Lab 02/05/17 2209 02/06/17 0520 02/06/17 0832 02/06/17 1659 02/07/17 0637  NA 125*  126* 127* 128* 132*  K 2.7* 2.9* 2.8* 3.2* 3.6  CL 90* 91* 92* 94* 99*  CO2 23 24 24 22 23   GLUCOSE 107* 103* 132* 112* 102*  BUN 18 15 15 13 12   CREATININE 1.16* 0.97 1.08* 0.90 0.96  CALCIUM 8.3* 8.6* 8.5* 8.9 8.7*  MG  --  1.8  --   --   --    Liver Function Tests: Recent Labs  Lab 02/05/17 1049  AST 32  ALT 20  ALKPHOS 50  BILITOT 1.0  PROT 7.1  ALBUMIN 3.6   Recent Labs  Lab 02/05/17 1049  LIPASE 18   No results for input(s): AMMONIA in the last 168 hours. CBC: Recent Labs  Lab 02/05/17 1049 02/06/17 0520 02/07/17 0637  WBC 12.1* 7.2 8.5  NEUTROABS 9.6*  --  5.1  HGB 12.0 10.6* 11.0*  HCT 34.2* 30.6* 32.6*  MCV 85.7 86.4 87.2  PLT 184 182 215   Cardiac Enzymes: Recent Labs  Lab 02/05/17 1049  TROPONINI <0.03   BNP: BNP (last 3 results) No results for input(s): BNP in the last 8760 hours.  ProBNP (last 3 results) No results for input(s): PROBNP in the last 8760 hours.  CBG: Recent Labs  Lab 02/05/17 1047  GLUCAP 142*       Signed:  Alma Friendly, MD [facility] 02/07/2017, 6:54 PM

## 2017-02-07 NOTE — Progress Notes (Signed)
Progress Note  Patient Name: Amber Kennedy Date of Encounter: 02/07/2017  Primary Cardiologist: No primary care provider on file.   Subjective   Feels well, no CP, no SOB  Inpatient Medications    Scheduled Meds: . allopurinol  100 mg Oral Daily  . diltiazem  120 mg Oral Daily  . rivaroxaban  20 mg Oral Q supper  . sodium chloride flush  3 mL Intravenous Q12H   Continuous Infusions: . 0.9 % NaCl with KCl 20 mEq / L 75 mL/hr at 02/06/17 2321   PRN Meds: acetaminophen **OR** acetaminophen, bisacodyl, HYDROcodone-acetaminophen, ondansetron **OR** ondansetron (ZOFRAN) IV, senna-docusate   Vital Signs    Vitals:   02/06/17 1940 02/07/17 0214 02/07/17 0339 02/07/17 0922  BP: 131/69  113/62 (!) 115/38  Pulse: (!) 121   72  Resp: 18   18  Temp: 98 F (36.7 C)   98.5 F (36.9 C)  TempSrc: Oral   Oral  SpO2: 100%   97%  Weight:  179 lb 3.2 oz (81.3 kg)    Height:        Intake/Output Summary (Last 24 hours) at 02/07/2017 1143 Last data filed at 02/07/2017 0708 Gross per 24 hour  Intake 2501.25 ml  Output 3150 ml  Net -648.75 ml   Filed Weights   02/05/17 1027 02/06/17 0457 02/07/17 0214  Weight: 173 lb (78.5 kg) 179 lb 3.2 oz (81.3 kg) 179 lb 3.2 oz (81.3 kg)    Telemetry    Brief AFIB RVR- Personally Reviewed  ECG    AFIB - Personally Reviewed  Physical Exam   GEN: No acute distress.   Neck: No JVD Cardiac: RRR, no murmurs, rubs, or gallops.  Respiratory: Clear to auscultation bilaterally. GI: Soft, nontender, non-distended  MS: No edema; No deformity. Neuro:  Nonfocal  Psych: Normal affect   Labs    Chemistry Recent Labs  Lab 02/05/17 1049  02/06/17 0832 02/06/17 1659 02/07/17 0637  NA 122*   < > 127* 128* 132*  K 3.1*   < > 2.8* 3.2* 3.6  CL 85*   < > 92* 94* 99*  CO2 23   < > 24 22 23   GLUCOSE 129*   < > 132* 112* 102*  BUN 17   < > 15 13 12   CREATININE 1.12*   < > 1.08* 0.90 0.96  CALCIUM 8.8*   < > 8.5* 8.9 8.7*  PROT 7.1  --   --    --   --   ALBUMIN 3.6  --   --   --   --   AST 32  --   --   --   --   ALT 20  --   --   --   --   ALKPHOS 50  --   --   --   --   BILITOT 1.0  --   --   --   --   GFRNONAA 45*   < > 47* 58* 54*  GFRAA 52*   < > 54* >60 >60  ANIONGAP 14   < > 11 12 10    < > = values in this interval not displayed.     Hematology Recent Labs  Lab 02/05/17 1049 02/06/17 0520 02/07/17 0637  WBC 12.1* 7.2 8.5  RBC 3.99 3.54* 3.74*  HGB 12.0 10.6* 11.0*  HCT 34.2* 30.6* 32.6*  MCV 85.7 86.4 87.2  MCH 30.1 29.9 29.4  MCHC 35.1 34.6 33.7  RDW  12.8 13.2 13.0  PLT 184 182 215    Cardiac Enzymes Recent Labs  Lab 02/05/17 1049  TROPONINI <0.03   No results for input(s): TROPIPOC in the last 168 hours.   BNPNo results for input(s): BNP, PROBNP in the last 168 hours.   DDimer  Recent Labs  Lab 02/05/17 1049  DDIMER 1.21*     Radiology    Ct Angio Chest Pe W And/or Wo Contrast  Result Date: 02/05/2017 CLINICAL DATA:  Nausea.  Elevated D-dimer. EXAM: CT ANGIOGRAPHY CHEST WITH CONTRAST TECHNIQUE: Multidetector CT imaging of the chest was performed using the standard protocol during bolus administration of intravenous contrast. Multiplanar CT image reconstructions and MIPs were obtained to evaluate the vascular anatomy. CONTRAST:  170mL ISOVUE-370 IOPAMIDOL (ISOVUE-370) INJECTION 76% COMPARISON:  None. FINDINGS: Cardiovascular: The pulmonary arteries are adequately opacified. There is no evidence of pulmonary embolism. Central pulmonary arteries are normal in caliber. The heart size is normal. No pericardial fluid. Calcified plaque noted in the left main coronary artery and LAD. Calcified plaque in the thoracic aorta without evidence of aortic aneurysm. Mediastinum/Nodes: No enlarged mediastinal, hilar, or axillary lymph nodes. Thyroid gland, trachea, and esophagus demonstrate no significant findings. There is a small/moderate hiatal hernia. Lungs/Pleura: There is no evidence of pulmonary edema,  consolidation, pneumothorax, nodule or pleural fluid. Upper Abdomen: Small 10 mm cyst along the lateral margin of the caudate appears benign. Musculoskeletal: No chest wall abnormality. No acute or significant osseous findings. Review of the MIP images confirms the above findings. IMPRESSION: 1. No evidence of pulmonary embolism. 2. Coronary atherosclerosis with calcified plaque seen in the distribution of the left main coronary artery and LA 3. Aortic atherosclerosis without evidence of aneurysmal disease of the thoracic aorta. 4. Small a moderate sized hiatal hernia. Aortic Atherosclerosis (ICD10-I70.0). Electronically Signed   By: Aletta Edouard M.D.   On: 02/05/2017 13:01    Cardiac Studies   ECHO 02/06/17 - Left ventricle: The cavity size was normal. Wall thickness was   increased in a pattern of mild LVH. Systolic function was normal.   The estimated ejection fraction was in the range of 60% to 65%.   Wall motion was normal; there were no regional wall motion   abnormalities. Features are consistent with a pseudonormal left   ventricular filling pattern, with concomitant abnormal relaxation   and increased filling pressure (grade 2 diastolic dysfunction).   Indeterminate filling pressures. - Mitral valve: Mildly calcified annulus. - Left atrium: The atrium was mildly dilated. - Atrial septum: No defect or patent foramen ovale was identified. - Tricuspid valve: There was mild-moderate regurgitation. - Pulmonary arteries: PA peak pressure: 35 mm Hg (S). - Systemic veins: Dilated IVC with normal respiratory variation.   Estimated CVP 8 mmHg.  Patient Profile     84 y.o. female with paroxysmal atrial fibrillation in the setting of metabolic derangement  Assessment & Plan    Paroxysmal atrial fibrillation -Continue with Xarelto.  Changing metoprolol to long-acting diltiazem CD 120 once a day. - Metabolic derangement could have precipitated atrial fibrillation however she still is at  risk for stroke.  Would recommend anticoagulation.  Could consider future evaluation with event monitor but at this time, would continue anticoagulation.  Risk score is 4.  Discussed with family.  Essential hypertension -Do not use diuretic.  Okay to use diltiazem instead.  We will have follow-up with cardiology. OK with Dc   For questions or updates, please contact [place] Please consult [email or web address] for contact  info under Cardiology/STEMI.      Signed, Candee Furbish, MD  02/07/2017, 11:43 AM

## 2017-02-07 NOTE — Progress Notes (Addendum)
Patient discharged home prior to receiving coupon card for Xarelto; Benefit check in progress / not completed at this time; attempted to call pt/ family member no answer at this timeAneta Mins [phone]   RE: Benefit check  Received: Today  Message Contents  Memory Argue CMA        # 5.  S/W TANIA @ [facility] # [phone]    1. XARELTO 20 MG DAILY  COVER- YES  CO-PAY- $ 47.00  TIER- 3 DRUG  PRIOR APPROVAL- NO  DEDUCTIBLE NOT MET   RIVAROXABAN: NONE FORMULARY   PREFERRED PHARMACY : [facility], [facility]

## 2017-02-10 ENCOUNTER — Telehealth: Payer: Self-pay | Admitting: Cardiology

## 2017-02-10 NOTE — Telephone Encounter (Signed)
New message  Patient calling with concerns of palpitations.    Patient c/o Palpitations:  High priority if patient c/o lightheadedness, shortness of breath, or chest pain  1) How long have you had palpitations/irregular HR/ Afib? Are you having the symptoms now? Palpitations earlier today, no palpitations at this moment  2) Are you currently experiencing lightheadedness, SOB or CP? No  3) Do you have a history of afib (atrial fibrillation) or irregular heart rhythm? yes  4) Have you checked your BP or HR? (document readings if available): 154/9?  5) Are you experiencing any other symptoms? Weak, hand bruised

## 2017-02-10 NOTE — Telephone Encounter (Signed)
Spoke with pt who is reporting she had been feeling fine since d/c from hospital until today.  She states she has been feeling her heart fluttering since vacuuming this morning.  She also feels weak and is c/o bruising on her hands.  She had previously been on Metoprolol for HTN but when seen recently in  The hospital was changed to diltiazem 120 mg a day and started on Xarelto 20 mg for At FIb.  She reports her normal BP/HR is 125-135/70-80 with HR in the 70's.  Had pt check up BP while on the phone and she reports her machine shows 222/124 with HR 94.  Pt states "I don't believe that is right".  Advised pt to lay down and try to relax.  She should recheck her BP in a few minutes and I will c/b to f/u.  Pt is in agreement.

## 2017-02-10 NOTE — Telephone Encounter (Signed)
Called to f/u with pt - she states she is back to "normal" though her voice sounds anxious.  Pt rechecked her BP/HR and reports it is 189/98 HR 82.  Advised I will review with Dr Marlou Porch and c/b any any further orders.  Her BP this AM was 154/95 and was much better controlled on Metoprolol per her report.

## 2017-02-11 NOTE — Telephone Encounter (Signed)
Reviewed. Anxiety playing a role in increased BP transiently. In prolonged monitoring in hospital during periods of calm bp was excellent on current regiment. No change.  Candee Furbish, MD

## 2017-02-11 NOTE — Telephone Encounter (Signed)
Spoke pt who is aware to continue her current medications as listed.  She will continue to monitor her BP.  Recommended her to speak with her PCP about her anxiety.  She has an appt with Dr Marisue Humble next week.

## 2017-02-16 DIAGNOSIS — E871 Hypo-osmolality and hyponatremia: Secondary | ICD-10-CM | POA: Diagnosis not present

## 2017-02-16 DIAGNOSIS — E876 Hypokalemia: Secondary | ICD-10-CM | POA: Diagnosis not present

## 2017-02-16 DIAGNOSIS — I4891 Unspecified atrial fibrillation: Secondary | ICD-10-CM | POA: Diagnosis not present

## 2017-02-16 DIAGNOSIS — I10 Essential (primary) hypertension: Secondary | ICD-10-CM | POA: Diagnosis not present

## 2017-03-09 ENCOUNTER — Telehealth: Payer: Self-pay | Admitting: Cardiology

## 2017-03-09 MED ORDER — RIVAROXABAN 20 MG PO TABS: 20.0000 mg | ORAL_TABLET | Freq: Every day | ORAL | 5 refills | Status: DC

## 2017-03-09 NOTE — Telephone Encounter (Signed)
This has been done.

## 2017-03-09 NOTE — Telephone Encounter (Signed)
New message     *STAT* If patient is at the pharmacy, call can be transferred to refill team.   1. Which medications need to be refilled? (please list name of each medication and dose if known) rivaroxaban (XARELTO) 20 MG TABS tablet  2. Which pharmacy/location (including street and city if local pharmacy) is medication to be sent to?[place], [place]  3. Do they need a 30 day or 90 day supply? [place]

## 2017-03-18 ENCOUNTER — Ambulatory Visit: Payer: Medicare HMO | Admitting: Cardiology

## 2017-03-18 ENCOUNTER — Encounter: Payer: Self-pay | Admitting: Cardiology

## 2017-03-18 VITALS — BP 146/82 | HR 52 | Ht 67.0 in | Wt 179.1 lb

## 2017-03-18 DIAGNOSIS — E871 Hypo-osmolality and hyponatremia: Secondary | ICD-10-CM

## 2017-03-18 DIAGNOSIS — M1000 Idiopathic gout, unspecified site: Secondary | ICD-10-CM | POA: Diagnosis not present

## 2017-03-18 DIAGNOSIS — I10 Essential (primary) hypertension: Secondary | ICD-10-CM | POA: Diagnosis not present

## 2017-03-18 DIAGNOSIS — I480 Paroxysmal atrial fibrillation: Secondary | ICD-10-CM | POA: Diagnosis not present

## 2017-03-18 MED ORDER — METOPROLOL SUCCINATE ER 100 MG PO TB24: 100.0000 mg | ORAL_TABLET | Freq: Every day | ORAL | 3 refills | Status: DC

## 2017-03-18 MED ORDER — METOPROLOL SUCCINATE ER 50 MG PO TB24: 50.0000 mg | ORAL_TABLET | Freq: Every day | ORAL | 3 refills | Status: DC

## 2017-03-18 NOTE — Progress Notes (Signed)
Cardiology Office Note:    Date:  03/18/2017   ID:  Amber Kennedy, DOB 12/09/1933, MRN [id number]  PCP:  [name], MD  Cardiologist:  Candee Furbish, MD   Referring MD: [name], MD    History of Present Illness:    Amber Kennedy is a 84 y.o. female with paroxysmal atrial fibrillation, hyperlipidemia, hypertension here for follow-up.  No bleeding side effects.  Gout has been an issue.  Sometimes shaky every morning.  Taking allopurinol.  Stopped taking B complex and C vitamin for potential interactions.  Hospitalized on 02/05/17.  Here for follow-up.  Last creatinine 0.95, hemoglobin 11.0, hemoglobin A1c 5.9, LDL 125.  She would like to switch back to beta-blocker.  She sometimes feels shaky in the mornings.  We will go ahead and switch her back to metoprolol.  She used to be on atenolol 50.  She was on a diuretic component but her sodium was 122 when she was admitted.  She is also having some gout issues.  Had questions about allopurinol.  Past Medical History:  Diagnosis Date  . A-fib ([place])   . Gout   . Hypercholesterolemia   . Hypertension   . Hypokalemia   . Hyponatremia   . Vision loss     History reviewed. No pertinent surgical history.  Current Medications: Current Meds  Medication Sig  . allopurinol (ZYLOPRIM) 300 MG tablet Take 300 mg by mouth daily.  . rivaroxaban (XARELTO) 20 MG TABS tablet Take 1 tablet (20 mg total) by mouth daily with supper.  . [DISCONTINUED] diltiazem (CARDIZEM CD) 180 MG 24 hr capsule Take 1 capsule by mouth daily.     Allergies:   Penicillin g and Latex   Social History   Socioeconomic History  . Marital status: Married    Spouse name: Sheridyn Canino  . Number of children: None  . Years of education: None  . Highest education level: None  Social Needs  . Financial resource strain: None  . Food insecurity - worry: None  . Food insecurity - inability: None  . Transportation needs - medical: None  . Transportation needs  - non-medical: None  Occupational History  . Occupation: Retired  Tobacco Use  . Smoking status: Former Smoker    Types: Cigarettes    Last attempt to quit: 2004    Years since quitting: 15.1  . Smokeless tobacco: Never Used  Substance and Sexual Activity  . Alcohol use: No    Frequency: Never  . Drug use: No  . Sexual activity: None  Other Topics Concern  . None  Social History Narrative  . None     Family History: The patient's family history is negative for Sudden Cardiac Death.  ROS:   Please see the history of present illness.     All other systems reviewed and are negative.  EKGs/Labs/Other Studies Reviewed:    The following studies were reviewed today:   - Left ventricle: The cavity size was normal. Wall thickness was   increased in a pattern of mild LVH. Systolic function was normal.   The estimated ejection fraction was in the range of 60% to 65%.   Wall motion was normal; there were no regional wall motion   abnormalities. Features are consistent with a pseudonormal left   ventricular filling pattern, with concomitant abnormal relaxation   and increased filling pressure (grade 2 diastolic dysfunction).   Indeterminate filling pressures. - Mitral valve: Mildly calcified annulus. - Left atrium: The atrium was  mildly dilated. - Atrial septum: No defect or patent foramen ovale was identified. - Tricuspid valve: There was mild-moderate regurgitation. - Pulmonary arteries: PA peak pressure: 35 mm Hg (S). - Systemic veins: Dilated IVC with normal respiratory variation.   Estimated CVP 8 mmHg.  CT scan was negative for PE in January 2019.  EKG:  EKG is not ordered today.  Prior EKG shows A. fib 140s.  EKG from 02/16/17 shows sinus rhythm with small T wave inversion aVL, V2  Recent Labs: 02/05/2017: ALT 20; TSH 6.325 02/06/2017: Magnesium 1.8 02/07/2017: BUN 12; Creatinine, Ser 0.96; Hemoglobin 11.0; Platelets 215; Potassium 3.6; Sodium 132  Recent Lipid  Panel No results found for: CHOL, TRIG, HDL, CHOLHDL, VLDL, LDLCALC, LDLDIRECT  Physical Exam:    VS:  BP (!) 146/82   Pulse (!) 52   Ht 5\' 7"  (1.702 m)   Wt 179 lb 1.9 oz (81.2 kg)   SpO2 99%   BMI 28.05 kg/m     Wt Readings from Last 3 Encounters:  03/18/17 179 lb 1.9 oz (81.2 kg)  02/07/17 179 lb 3.2 oz (81.3 kg)     GEN:  Well nourished, well developed in no acute distress HEENT: Normal NECK: No JVD; No carotid bruits LYMPHATICS: No lymphadenopathy CARDIAC: RRR, no murmurs, rubs, gallops RESPIRATORY:  Clear to auscultation without rales, wheezing or rhonchi  ABDOMEN: Soft, non-tender, non-distended MUSCULOSKELETAL:  No edema; No deformity  SKIN: Warm and dry NEUROLOGIC:  Alert and oriented x 3 PSYCHIATRIC:  Normal affect   ASSESSMENT:    1. Paroxysmal atrial fibrillation (HCC)   2. Hyponatremia   3. Acute idiopathic gout, unspecified site   4. Essential hypertension    PLAN:    In order of problems listed above:  Paroxysmal atrial fibrillation -Xarelto, changed metoprolol to long-acting diltiazem CD 180 once a day.  Anticoagulation, risk score is 4.  I will change her back to beta-blocker, metoprolol succinate 50 mg once a day.  Currently in sinus bradycardia.  52. OK to try Kardia device.   Much for any signs of bradycardia.  EF was normal.  Gout - Allopurinol.  Continue to work with Dr.Ehinger.   Hyponatremia -122 on admission to hospital.  Hydrochlorothiazide has been stopped it was in combination with the atenolol previously. Recent lab work done at Dr. Marisue Humble.   Essential hypertension -Currently at goal.  Avoid diuretic given the hyponatremia previously.  Medication Adjustments/Labs and Tests Ordered: Current medicines are reviewed at length with the patient today.  Concerns regarding medicines are outlined above.  No orders of the defined types were placed in this encounter.  Meds ordered this encounter  Medications  . DISCONTD: metoprolol  succinate (TOPROL-XL) 100 MG 24 hr tablet    Sig: Take 1 tablet (100 mg total) by mouth daily. Take with or immediately following a meal.    Dispense:  90 tablet    Refill:  3  . metoprolol succinate (TOPROL-XL) 50 MG 24 hr tablet    Sig: Take 1 tablet (50 mg total) by mouth daily. Take with or immediately following a meal.    Dispense:  90 tablet    Refill:  3    Signed, Candee Furbish, MD  03/18/2017 9:35 AM    [place]

## 2017-03-18 NOTE — Patient Instructions (Signed)
Medication Instructions:  Please discontinue Diltiazem and start Metoprolol Succinate 50 mg a day. Continue all other medications as listed.   Follow-Up: Follow up in 1 month with Cecilie Kicks, NP.  If you need a refill on your cardiac medications before your next appointment, please call your pharmacy.  Thank you for choosing [facility]!!

## 2017-04-19 DIAGNOSIS — M109 Gout, unspecified: Secondary | ICD-10-CM | POA: Diagnosis not present

## 2017-04-21 ENCOUNTER — Ambulatory Visit: Payer: Medicare HMO | Admitting: Cardiology

## 2017-04-21 ENCOUNTER — Encounter: Payer: Self-pay | Admitting: Cardiology

## 2017-04-21 VITALS — BP 142/86 | HR 68 | Ht 67.0 in | Wt 183.4 lb

## 2017-04-21 DIAGNOSIS — I10 Essential (primary) hypertension: Secondary | ICD-10-CM

## 2017-04-21 DIAGNOSIS — E876 Hypokalemia: Secondary | ICD-10-CM

## 2017-04-21 DIAGNOSIS — I480 Paroxysmal atrial fibrillation: Secondary | ICD-10-CM

## 2017-04-21 DIAGNOSIS — E871 Hypo-osmolality and hyponatremia: Secondary | ICD-10-CM | POA: Diagnosis not present

## 2017-04-21 DIAGNOSIS — M1000 Idiopathic gout, unspecified site: Secondary | ICD-10-CM

## 2017-04-21 LAB — BASIC METABOLIC PANEL
BUN/Creatinine Ratio: 15 (ref 12–28)
BUN: 14 mg/dL (ref 8–27)
CO2: 22 mmol/L (ref 20–29)
Calcium: 9.7 mg/dL (ref 8.7–10.3)
Chloride: 102 mmol/L (ref 96–106)
Creatinine, Ser: 0.94 mg/dL (ref 0.57–1.00)
GFR calc Af Amer: 65 mL/min/{1.73_m2} (ref >59)
GFR calc non Af Amer: 57 mL/min/{1.73_m2} — ABNORMAL LOW (ref >59)
Glucose: 94 mg/dL (ref 65–99)
Potassium: 4.4 mmol/L (ref 3.5–5.2)
Sodium: 139 mmol/L (ref 134–144)

## 2017-04-21 NOTE — Progress Notes (Signed)
Cardiology Office Note   Date:  04/21/2017   ID:  Amber Kennedy, DOB [birth year], MRN [id number]  PCP:  [name], MD  Cardiologist:  Dr. Marlou Porch    Chief Complaint  Patient presents with  . Atrial Fibrillation      History of Present Illness: Amber Kennedy is a 84 y.o. female who presents for PAF.  Pt with a hx of paroxysmal atrial fibrillation, hyperlipidemia, hypertension.  Gout has been an issue. On last visit she complained of sometimes shaky every morning.  Taking allopurinol.  Stopped taking B complex and C vitamin for potential interactions.  Hospitalized on 02/05/17. dehydrated and hyponatremia meds adjusted. Including stopping BB and adding dilt.   Last creatinine 0.95, hemoglobin 11.0, hemoglobin A1c 5.9, LDL 125.  On last visit she wanted to switch back to beta-blocker.  She sometimes feels shaky in the mornings.  This was done.   She was on a diuretic component but her sodium was 122 when she was admitted.  She is also having some gout issues.  Had questions about allopurinol.Brief a fib with RVR during hospitalization   On CT during hospitalization "Calcified plaque noted in the left main coronary artery and LAD. Calcified plaque in the thoracic aorta without evidence of aortic aneurysm"  No chest pain.  Anticoagulation, risk score is 4. On xarelto daily.  OK to try Kardia device.   HR today 68 and regular --the shakiness has resolved no rapid HR.  No chest pain or SOB is feeling well.  PCP is following gout.  I do not see recheck on K+ and Na since discharge.  Her BP is mildly elevated but at home has been in 130s.  Systolic.  Past Medical History:  Diagnosis Date  . A-fib ([place])   . Gout   . Hypercholesterolemia   . Hypertension   . Hypokalemia   . Hyponatremia   . Vision loss     History reviewed. No pertinent surgical history.   Current Outpatient Medications  Medication Sig Dispense Refill  . allopurinol (ZYLOPRIM) 300 MG tablet Take  300 mg by mouth daily.    . metoprolol succinate (TOPROL-XL) 50 MG 24 hr tablet Take 1 tablet (50 mg total) by mouth daily. Take with or immediately following a meal. 90 tablet 3  . rivaroxaban (XARELTO) 20 MG TABS tablet Take 1 tablet (20 mg total) by mouth daily with supper. 30 tablet 5   No current facility-administered medications for this visit.     Allergies:   Penicillin g and Latex    Social History:  The patient  reports that she quit smoking about 15 years ago. Her smoking use included cigarettes. She has never used smokeless tobacco. She reports that she does not drink alcohol or use drugs.   Family History:  The patient's family history includes Sudden death in her father.    ROS:  General:no colds or fevers, mild weight gain Skin:no rashes or ulcers HEENT:no blurred vision, no congestion CV:see HPI PUL:see HPI GI:no diarrhea constipation or melena, no indigestion GU:no hematuria, no dysuria- urine is darker.  Off B complex.  MS:no joint pain, no claudication Neuro:no syncope, no lightheadedness Endo:no diabetes, no thyroid disease  Wt Readings from Last 3 Encounters:  04/21/17 183 lb 6.4 oz (83.2 kg)  03/18/17 179 lb 1.9 oz (81.2 kg)  02/07/17 179 lb 3.2 oz (81.3 kg)     PHYSICAL EXAM: VS:  BP (!) 142/86   Pulse 68  Ht 5\' 7"  (1.702 m)   Wt 183 lb 6.4 oz (83.2 kg)   SpO2 99%   BMI 28.72 kg/m  , BMI Body mass index is 28.72 kg/m. General:Pleasant affect, NAD Skin:Warm and dry, brisk capillary refill HEENT:normocephalic, sclera clear, mucus membranes moist Neck:supple, no JVD, no bruits  Heart:S1S2 RRR without murmur, gallup, rub or click Lungs:clear without rales, occ rhonchi, no wheezes LDJ:TTSV, non tender, + BS, do not palpate liver spleen or masses Ext:no lower ext edema,  2+ radial pulses Neuro:alert and oriented X 3, MAE, follows commands, + facial symmetry    EKG:  EKG is NOT ordered today.   Recent Labs: 02/05/2017: ALT 20; TSH  6.325 02/06/2017: Magnesium 1.8 02/07/2017: BUN 12; Creatinine, Ser 0.96; Hemoglobin 11.0; Platelets 215; Potassium 3.6; Sodium 132    Lipid Panel No results found for: CHOL, TRIG, HDL, CHOLHDL, VLDL, LDLCALC, LDLDIRECT     Other studies Reviewed: Additional studies/ records that were reviewed today include: . Echo 02/06/17  Study Conclusions  - Left ventricle: The cavity size was normal. Wall thickness was   increased in a pattern of mild LVH. Systolic function was normal.   The estimated ejection fraction was in the range of 60% to 65%.   Wall motion was normal; there were no regional wall motion   abnormalities. Features are consistent with a pseudonormal left   ventricular filling pattern, with concomitant abnormal relaxation   and increased filling pressure (grade 2 diastolic dysfunction).   Indeterminate filling pressures. - Mitral valve: Mildly calcified annulus. - Left atrium: The atrium was mildly dilated. - Atrial septum: No defect or patent foramen ovale was identified. - Tricuspid valve: There was mild-moderate regurgitation. - Pulmonary arteries: PA peak pressure: 35 mm Hg (S). - Systemic veins: Dilated IVC with normal respiratory variation.   Estimated CVP 8 mmHg.  ASSESSMENT AND PLAN:  1.  PAF no further episodes she is aware of.   On Xarelto with no bleeding. Doing better on BB instead of diltiazem. HR is 68 today.   2.  HTN BP mildly elevated here at home runs lower.  Continue to monitor.  3.  Hyponatremia and hypokalemia will recheck today.  Off diuretic since hospitalization.  4.  Gout followed by PCP stable currently     Current medicines are reviewed with the patient today.  The patient Has no concerns regarding medicines.  The following changes have been made:  See above Labs/ tests ordered today include:see above  Disposition:   FU:  see above  Signed, Cecilie Kicks, NP  04/21/2017 12:12 PM    [facility], [place], [place], [place] Phone: [phone]; Fax: [phone]

## 2017-04-21 NOTE — Patient Instructions (Addendum)
Medication Instructions:  1. Your physician recommends that you continue on your current medications as directed. Please refer to the Current Medication list given to you today.   Labwork: TODAY BMET  Testing/Procedures: NONE ORDERED TODAY  Follow-Up: DR. Marlou Porch ON July 22, 2017 @ 8:40 AM   Any Other Special Instructions Will Be Listed Below (If Applicable).     If you need a refill on your cardiac medications before your next appointment, please call your pharmacy.

## 2017-07-22 ENCOUNTER — Ambulatory Visit: Payer: Medicare HMO | Admitting: Cardiology

## 2017-08-29 ENCOUNTER — Other Ambulatory Visit: Payer: Self-pay | Admitting: Cardiology

## 2017-08-29 NOTE — Telephone Encounter (Signed)
Xarelto 20mg  refill request received; pt is 84 yrs old, wt-83.2kg, Crea-0.94 on 04/21/17, last seen by Cecilie Kicks on 04/21/17, CrCl-60.95ml/min; will send in refill to requested pharmacy.

## 2018-01-09 DIAGNOSIS — I700 Atherosclerosis of aorta: Secondary | ICD-10-CM | POA: Diagnosis not present

## 2018-01-09 DIAGNOSIS — M109 Gout, unspecified: Secondary | ICD-10-CM | POA: Diagnosis not present

## 2018-01-09 DIAGNOSIS — N183 Chronic kidney disease, stage 3 (moderate): Secondary | ICD-10-CM | POA: Diagnosis not present

## 2018-01-09 DIAGNOSIS — I480 Paroxysmal atrial fibrillation: Secondary | ICD-10-CM | POA: Diagnosis not present

## 2018-01-09 DIAGNOSIS — D692 Other nonthrombocytopenic purpura: Secondary | ICD-10-CM | POA: Diagnosis not present

## 2018-01-09 DIAGNOSIS — I10 Essential (primary) hypertension: Secondary | ICD-10-CM | POA: Diagnosis not present

## 2018-01-09 DIAGNOSIS — E782 Mixed hyperlipidemia: Secondary | ICD-10-CM | POA: Diagnosis not present

## 2018-01-09 DIAGNOSIS — R7303 Prediabetes: Secondary | ICD-10-CM | POA: Diagnosis not present

## 2018-01-12 DIAGNOSIS — H34811 Central retinal vein occlusion, right eye, with macular edema: Secondary | ICD-10-CM | POA: Diagnosis not present

## 2018-01-12 DIAGNOSIS — H348322 Tributary (branch) retinal vein occlusion, left eye, stable: Secondary | ICD-10-CM | POA: Diagnosis not present

## 2018-01-12 DIAGNOSIS — Z961 Presence of intraocular lens: Secondary | ICD-10-CM | POA: Diagnosis not present

## 2018-03-16 ENCOUNTER — Other Ambulatory Visit: Payer: Self-pay | Admitting: Cardiology

## 2018-04-17 ENCOUNTER — Other Ambulatory Visit: Payer: Self-pay | Admitting: Cardiology

## 2018-05-19 ENCOUNTER — Telehealth: Payer: Self-pay | Admitting: *Deleted

## 2018-05-19 ENCOUNTER — Other Ambulatory Visit: Payer: Self-pay | Admitting: Cardiology

## 2018-05-19 ENCOUNTER — Other Ambulatory Visit: Payer: Self-pay | Admitting: *Deleted

## 2018-05-19 NOTE — Telephone Encounter (Signed)
Pt is overdue for follow-up with Dr Marlou Porch, pt last saw Cecilie Kicks, NP 04/21/17 was scheduled to follow-up with Dr Marlou Porch 07/22/17 but appt was cancelled.  Pt has not seen a provider in greater than 1 year.  Last labs 01/09/18 Creat 0.94 at [place], age 85, weight 83.2kg, CrCl 59.56, based on CrCl pt is on appropriate dosage of Xarelto 20mg  QD.  Will refill rx x 1 and place note on refill for pt to call office to schedule appt for future refills, overdue for follow-up.

## 2018-05-19 NOTE — Telephone Encounter (Signed)
Opened in error

## 2018-05-19 NOTE — Telephone Encounter (Signed)
Pt is overdue for follow-up with Dr Marlou Porch, pt last saw Cecilie Kicks, NP on 04/21/17 and was supposed to f/u with Dr Marlou Porch 07/22/17, but cancelled appt and it has been over 1 year since pt last seen. Pt's last labs 01/09/18 Creat 0.94 at [place], age 85, weight 83.2kg, CrCl 59.56, based on CrCl pt is on appropriate dosage of Xarelto 20mg  QD.  Pt needs appointment for future refills will refill rx x 1 only with note on rx to schedule appt for future refills/overdue for follow-up.  Refill in refill box, will refill rx under that refill request.

## 2018-07-08 ENCOUNTER — Other Ambulatory Visit: Payer: Self-pay | Admitting: Cardiology

## 2018-07-14 ENCOUNTER — Other Ambulatory Visit: Payer: Self-pay | Admitting: Cardiology

## 2018-07-14 NOTE — Telephone Encounter (Signed)
Pt is overdue for follow-up rx refilled in April with note to pt to call office to schedule appt for refills.  No appointment has been made.  Pt saw Cecilie Kicks 04/21/17 and was supposed to f/u with Dr Marlou Porch 3 months later.  Pt cancelled appt in June 2019 and has never rescheduled.  I have attempted to contact pt multiple time to get pt f/u scheduled will refuse rx refill and notify pharmacy to have pt contact our office for refills. Will refill rx to get to apptointment once OV scheduled.

## 2018-07-18 NOTE — Telephone Encounter (Addendum)
Sent message to scheduling to make pt an appt - will send in 1 month refill after appt is made so that pt does not run out of Xarelto.

## 2018-07-24 ENCOUNTER — Telehealth: Payer: Self-pay | Admitting: Cardiology

## 2018-07-24 NOTE — Telephone Encounter (Signed)
New Message    Called and left message to schedule a follow up appt

## 2018-08-01 ENCOUNTER — Other Ambulatory Visit: Payer: Self-pay

## 2018-08-01 ENCOUNTER — Encounter: Payer: Self-pay | Admitting: Cardiology

## 2018-08-01 ENCOUNTER — Ambulatory Visit (INDEPENDENT_AMBULATORY_CARE_PROVIDER_SITE_OTHER): Payer: Medicare HMO | Admitting: Cardiology

## 2018-08-01 VITALS — BP 198/97 | HR 73 | Ht 67.0 in | Wt 184.0 lb

## 2018-08-01 DIAGNOSIS — I480 Paroxysmal atrial fibrillation: Secondary | ICD-10-CM

## 2018-08-01 DIAGNOSIS — I10 Essential (primary) hypertension: Secondary | ICD-10-CM | POA: Diagnosis not present

## 2018-08-01 DIAGNOSIS — R0989 Other specified symptoms and signs involving the circulatory and respiratory systems: Secondary | ICD-10-CM | POA: Diagnosis not present

## 2018-08-01 NOTE — Progress Notes (Signed)
Patient referred by [name], MD for atrial fibrillation  Subjective:   Amber Kennedy, female    DOB: 1933-10-10, 85 y.o.   MRN: [id number]   Chief Complaint  Patient presents with  . New Patient (Initial Visit)  . Atrial Fibrillation    HPI  85 y.o. Caucasian female with hypertension, hyperlipidemia, paroxysmal atrial fibrillation.  Patient lives alone, but has her children visiting her often. She has visual impairment, so she doe snot drive. She is able to perform all her ADL's without difficulty. She denies chest pain, shortness of breath, palpitations, leg edema, orthopnea, PND, TIA/syncope.  Blood pressure is elevated today on multiple checks.She denies any symptoms associated with it. She has not used her home BP monitor in a few weeks.  Afib was diagnosed in 2019 when she had symptoms of fatigue.. She has been on Xarelto without any bleeding issues. She has not had any further such symptoms.   Patient tells me that she has advanced directive. She does not want DNR and does not want any surgeries. She would like to follow conservative medical management. She does want to take statin or any other cholesterol lowering medications.  Past Medical History:  Diagnosis Date  . A-fib ([place])   . Gout   . Hypercholesterolemia   . Hypertension   . Hypokalemia   . Hyponatremia   . Vision loss      Past Surgical History:  Procedure Laterality Date  . [place]     Social History   Socioeconomic History  . Marital status: Married    Spouse name: Tish Begin  . Number of children: Not on file  . Years of education: Not on file  . Highest education level: Not on file  Occupational History  . Occupation: Retired  Scientific laboratory technician  . Financial resource strain: Not on file  . Food insecurity    Worry: Not on file    Inability: Not on file  . Transportation needs    Medical: Not on file    Non-medical: Not on file  Tobacco Use  . Smoking status:  Former Smoker    Types: Cigarettes    Quit date: 2004    Years since quitting: 16.5  . Smokeless tobacco: Never Used  Substance and Sexual Activity  . Alcohol use: No    Frequency: Never  . Drug use: No  . Sexual activity: Not on file  Lifestyle  . Physical activity    Days per week: Not on file    Minutes per session: Not on file  . Stress: Not on file  Relationships  . Social Herbalist on phone: Not on file    Gets together: Not on file    Attends religious service: Not on file    Active member of club or organization: Not on file    Attends meetings of clubs or organizations: Not on file    Relationship status: Not on file  . Intimate partner violence    Fear of current or ex partner: Not on file    Emotionally abused: Not on file    Physically abused: Not on file    Forced sexual activity: Not on file  Other Topics Concern  . Not on file  Social History Narrative  . Not on file     Family History  Problem Relation Age of Onset  . Sudden death Father   . Sudden Cardiac Death Neg Hx  Current Outpatient Medications on File Prior to Visit  Medication Sig Dispense Refill  . allopurinol (ZYLOPRIM) 300 MG tablet Take 300 mg by mouth daily.    . metoprolol succinate (TOPROL-XL) 50 MG 24 hr tablet Take 1 tablet (50 mg total) by mouth daily. 30 tablet 0  . rivaroxaban (XARELTO) 20 MG TABS tablet Take 1 tablet (20 mg total) by mouth daily with supper. Overdue for follow-up. Needs appointment for future refills. 30 tablet 0   No current facility-administered medications on file prior to visit.     Cardiovascular studies:  EKG 08/01/2018: Sinus rhythm 70 bpm.  Leftward axis.  Otherwise normal EKG.   Echocardiogram 02/06/2017: - Left ventricle: The cavity size was normal. Wall thickness was   increased in a pattern of mild LVH. Systolic function was normal.   The estimated ejection fraction was in the range of 60% to 65%.   Wall motion was normal;  there were no regional wall motion   abnormalities. Features are consistent with a pseudonormal left   ventricular filling pattern, with concomitant abnormal relaxation   and increased filling pressure (grade 2 diastolic dysfunction).   Indeterminate filling pressures. - Mitral valve: Mildly calcified annulus. - Left atrium: The atrium was mildly dilated. - Atrial septum: No defect or patent foramen ovale was identified. - Tricuspid valve: There was mild-moderate regurgitation. - Pulmonary arteries: PA peak pressure: 35 mm Hg (S). - Systemic veins: Dilated IVC with normal respiratory variation.   Estimated CVP 8 mmHg.  Recent labs: 12/30/2017: Glucose 94. BUN/Cr 16/0.94. eGFR 57. Na/K 138/4.6. HbA1C 5.4% Chol 175, TG 185, HDL 39, LDL 137.   Review of Systems  Constitution: Negative for decreased appetite, malaise/fatigue, weight gain and weight loss.  HENT: Negative for congestion.   Eyes: Negative for visual disturbance.  Cardiovascular: Negative for chest pain, dyspnea on exertion, leg swelling, palpitations and syncope.  Respiratory: Negative for cough.   Endocrine: Negative for cold intolerance.  Hematologic/Lymphatic: Does not bruise/bleed easily.  Skin: Negative for itching and rash.  Musculoskeletal: Negative for myalgias.  Gastrointestinal: Negative for abdominal pain, nausea and vomiting.  Genitourinary: Negative for dysuria.  Neurological: Negative for dizziness and weakness.  Psychiatric/Behavioral: The patient is not nervous/anxious.   All other systems reviewed and are negative.        Vitals:   08/01/18 1445 08/01/18 1523  BP: (!) 198/87 (!) 198/97  Pulse: 73 73  SpO2: 97% 95%     Body mass index is 28.82 kg/m. Filed Weights   08/01/18 1445  Weight: 184 lb (83.5 kg)    Objective:   Physical Exam  Constitutional: She is oriented to person, place, and time. She appears well-developed and well-nourished. No distress.  HENT:  Head: Normocephalic  and atraumatic.  Eyes: Pupils are equal, round, and reactive to light. Conjunctivae are normal.  Neck: No JVD present.  Cardiovascular: Normal rate, regular rhythm and intact distal pulses.  Pulses:      Carotid pulses are on the right side with bruit. Pulmonary/Chest: Effort normal and breath sounds normal. She has no wheezes. She has no rales.  Abdominal: Soft. Bowel sounds are normal. There is no rebound.  Musculoskeletal:        General: No edema.  Lymphadenopathy:    She has no cervical adenopathy.  Neurological: She is alert and oriented to person, place, and time. No cranial nerve deficit.  Skin: Skin is warm and dry.  Psychiatric: She has a normal mood and affect.  Nursing note and  vitals reviewed.         Assessment & Recommendations:   85 y.o. Caucasian female with hypertension, hyperlipidemia, paroxysmal atrial fibrillation.  PAF: In sinus rhythm today.  CHA2DS2VASc score 4. Annual stroke risk 4%. Continue Xarelto 20 mg daily. Continue metoprolol succinate 50 mg daily.   Hypertension: BP elevated on multiple checks. She sees her PCP Dr. Marisue Humble once a year and does not have an appt till 12/2018. I have taken the liberty to start her on amlodipine 10 mg daily. Recommend f/u in 2-3 weeks with her home BP monitor.  Hyperlipidemia, carotid bruit: She is absolutely clear that she does not want to take statin or any other cholesterol lowering medications. I respect her wishes. In absence of any stroke/TIA symptoms, I have not recommended carotid US.  Nigel Mormon, MD [facility] Pager: [phone] Office: [phone] If no answer Cell [phone]

## 2018-08-17 ENCOUNTER — Other Ambulatory Visit: Payer: Self-pay

## 2018-08-17 ENCOUNTER — Telehealth: Payer: Self-pay

## 2018-08-17 MED ORDER — METOPROLOL SUCCINATE ER 50 MG PO TB24: 50.0000 mg | ORAL_TABLET | Freq: Every day | ORAL | 0 refills | Status: DC

## 2018-08-22 ENCOUNTER — Other Ambulatory Visit: Payer: Self-pay

## 2018-08-22 ENCOUNTER — Ambulatory Visit (INDEPENDENT_AMBULATORY_CARE_PROVIDER_SITE_OTHER): Payer: Medicare HMO | Admitting: Cardiology

## 2018-08-22 ENCOUNTER — Encounter: Payer: Self-pay | Admitting: Cardiology

## 2018-08-22 VITALS — BP 189/83 | HR 69 | Ht 67.0 in | Wt 180.0 lb

## 2018-08-22 DIAGNOSIS — I10 Essential (primary) hypertension: Secondary | ICD-10-CM

## 2018-08-22 DIAGNOSIS — I480 Paroxysmal atrial fibrillation: Secondary | ICD-10-CM | POA: Diagnosis not present

## 2018-08-22 MED ORDER — RIVAROXABAN 20 MG PO TABS: 20.0000 mg | ORAL_TABLET | Freq: Every day | ORAL | 2 refills | Status: DC

## 2018-08-22 MED ORDER — AMLODIPINE BESYLATE 10 MG PO TABS: 10.0000 mg | ORAL_TABLET | Freq: Every day | ORAL | 3 refills | Status: DC

## 2018-08-22 NOTE — Progress Notes (Signed)
Patient referred by Amber Arabian, MD for atrial fibrillation  Subjective:   Amber Kennedy, female    DOB: 02-Jan-1934, 85 y.o.   MRN: [id number]   Chief Complaint  Patient presents with  . Hypertension    BP follow up   . Atrial Fibrillation    HPI  85 y.o. Caucasian female with hypertension, hyperlipidemia, paroxysmal atrial fibrillation.  Patient is here with her daughter today. It appears that she never got started on amlodipine 10 mg daily. She denies chest pain, shortness of breath, palpitations, leg edema, orthopnea, PND, TIA/syncope. She remains reluctant to start lipid lowering therapy.   Past Medical History:  Diagnosis Date  . A-fib ([place])   . Gout   . Hypercholesterolemia   . Hypertension   . Hypokalemia   . Hyponatremia   . Vision loss      Past Surgical History:  Procedure Laterality Date  . [place]     Social History   Socioeconomic History  . Marital status: Widowed    Spouse name: Amber Kennedy  . Number of children: 6  . Years of education: Not on file  . Highest education level: Not on file  Occupational History  . Occupation: Retired  Scientific laboratory technician  . Financial resource strain: Not on file  . Food insecurity    Worry: Not on file    Inability: Not on file  . Transportation needs    Medical: Not on file    Non-medical: Not on file  Tobacco Use  . Smoking status: Former Smoker    Packs/day: 0.50    Years: 35.00    Pack years: 17.50    Types: Cigarettes    Quit date: 2004    Years since quitting: 16.5  . Smokeless tobacco: Never Used  Substance and Sexual Activity  . Alcohol use: No    Frequency: Never  . Drug use: No  . Sexual activity: Not on file  Lifestyle  . Physical activity    Days per week: Not on file    Minutes per session: Not on file  . Stress: Not on file  Relationships  . Social Herbalist on phone: Not on file    Gets together: Not on file    Attends religious service: Not on file     Active member of club or organization: Not on file    Attends meetings of clubs or organizations: Not on file    Relationship status: Not on file  . Intimate partner violence    Fear of current or ex partner: Not on file    Emotionally abused: Not on file    Physically abused: Not on file    Forced sexual activity: Not on file  Other Topics Concern  . Not on file  Social History Narrative  . Not on file     Family History  Problem Relation Age of Onset  . Sudden death Father   . Sudden Cardiac Death Neg Hx      Current Outpatient Medications on File Prior to Visit  Medication Sig Dispense Refill  . allopurinol (ZYLOPRIM) 300 MG tablet Take 150 mg by mouth daily.     . Ascorbic Acid (VITAMIN C) 1000 MG tablet Take 1,000 mg by mouth daily.    . Cholecalciferol (VITAMIN D3) 1.25 MG (50000 UT) CAPS Take by mouth daily.    . metoprolol succinate (TOPROL-XL) 50 MG 24 hr tablet Take 1 tablet (50 mg total)  by mouth daily. 90 tablet 0  . Multiple Vitamins-Minerals (MULTIVITAMIN WOMEN PO) Take by mouth daily.    . rivaroxaban (XARELTO) 20 MG TABS tablet Take 1 tablet (20 mg total) by mouth daily with supper. Overdue for follow-up. Needs appointment for future refills. 30 tablet 0   No current facility-administered medications on file prior to visit.     Cardiovascular studies:  EKG 08/01/2018: Sinus rhythm 70 bpm.  Leftward axis.  Otherwise normal EKG.   Echocardiogram 02/06/2017: - Left ventricle: The cavity size was normal. Wall thickness was   increased in a pattern of mild LVH. Systolic function was normal.   The estimated ejection fraction was in the range of 60% to 65%.   Wall motion was normal; there were no regional wall motion   abnormalities. Features are consistent with a pseudonormal left   ventricular filling pattern, with concomitant abnormal relaxation   and increased filling pressure (grade 2 diastolic dysfunction).   Indeterminate filling pressures. -  Mitral valve: Mildly calcified annulus. - Left atrium: The atrium was mildly dilated. - Atrial septum: No defect or patent foramen ovale was identified. - Tricuspid valve: There was mild-moderate regurgitation. - Pulmonary arteries: PA peak pressure: 35 mm Hg (S). - Systemic veins: Dilated IVC with normal respiratory variation.   Estimated CVP 8 mmHg.  Recent labs: 12/30/2017: Glucose 94. BUN/Cr 16/0.94. eGFR 57. Na/K 138/4.6. HbA1C 5.4% Chol 175, TG 185, HDL 39, LDL 137.   Review of Systems  Constitution: Negative for decreased appetite, malaise/fatigue, weight gain and weight loss.  HENT: Negative for congestion.   Eyes: Negative for visual disturbance.  Cardiovascular: Negative for chest pain, dyspnea on exertion, leg swelling, palpitations and syncope.  Respiratory: Negative for cough.   Endocrine: Negative for cold intolerance.  Hematologic/Lymphatic: Does not bruise/bleed easily.  Skin: Negative for itching and rash.  Musculoskeletal: Negative for myalgias.  Gastrointestinal: Negative for abdominal pain, nausea and vomiting.  Genitourinary: Negative for dysuria.  Neurological: Negative for dizziness and weakness.  Psychiatric/Behavioral: The patient is not nervous/anxious.   All other systems reviewed and are negative.        There were no vitals filed for this visit.   There is no height or weight on file to calculate BMI. There were no vitals filed for this visit.  Objective:   Physical Exam  Constitutional: She is oriented to person, place, and time. She appears well-developed and well-nourished. No distress.  HENT:  Head: Normocephalic and atraumatic.  Eyes: Pupils are equal, round, and reactive to light. Conjunctivae are normal.  Neck: No JVD present.  Cardiovascular: Normal rate, regular rhythm and intact distal pulses.  Pulmonary/Chest: Effort normal and breath sounds normal. She has no wheezes. She has no rales.  Abdominal: Soft. Bowel sounds are  normal. There is no rebound.  Musculoskeletal:        General: No edema.  Lymphadenopathy:    She has no cervical adenopathy.  Neurological: She is alert and oriented to person, place, and time. No cranial nerve deficit.  Skin: Skin is warm and dry.  Psychiatric: She has a normal mood and affect.  Nursing note and vitals reviewed.         Assessment & Recommendations:   85 y.o. Caucasian female with hypertension, hyperlipidemia, paroxysmal atrial fibrillation.  PAF: CHA2DS2VASc score 4. Annual stroke risk 4%. Continue Xarelto 20 mg daily. Continue metoprolol succinate 50 mg daily.   Hypertension: Start amlodipine 10 mg daily. Recommend home BP checks.   Hyperlipidemia, carotid bruit:  I do not appreciate carotid bruit on exam today. She remains reluctant to try lipid lowering therapy, and would like to make diet and lifestlye changes.   Telephone f/u in 4 weeks.   Nigel Mormon, MD [facility] Pager: [phone] Office: [phone] If no answer Cell [phone]

## 2018-09-19 NOTE — Progress Notes (Signed)
Patient referred by [name], MD for atrial fibrillation  Subjective:   Amber Kennedy, female    DOB: 04-19-1933, 85 y.o.   MRN: [id number]  I connected with the patient on 09/20/2018 by a telephone call and verified that I am speaking with the correct person using two identifiers.     I offered the patient a video enabled application for a virtual visit. Unfortunately, this could not be accomplished due to technical difficulties/lack of video enabled phone/computer. I discussed the limitations of evaluation and management by telemedicine and the availability of in person appointments. The patient expressed understanding and agreed to proceed.   This visit type was conducted due to national recommendations for restrictions regarding the COVID-19 Pandemic (e.g. social distancing).  This format is felt to be most appropriate for this patient at this time.  All issues noted in this document were discussed and addressed.  No physical exam was performed (except for noted visual exam findings with Tele health visits).  The patient has consented to conduct a Tele health visit and understands insurance will be billed.   Chief Complaint  Patient presents with  . Atrial Fibrillation  . Hypertension  . Follow-up    4 weeks    HPI  85 y.o. Caucasian female with hypertension, hyperlipidemia, paroxysmal atrial fibrillation.  Blood pressure is improved.  However, since increasing amlodipine to 10 mg daily, she is not experiencing leg edema.  She denies chest pain, shortness of breath, palpitations, leg edema, orthopnea, PND, TIA/syncope.   Past Medical History:  Diagnosis Date  . A-fib ([place])   . Gout   . Hypercholesterolemia   . Hypertension   . Hypokalemia   . Hyponatremia   . Vision loss      Past Surgical History:  Procedure Laterality Date  . [place]     Social History   Socioeconomic History  . Marital status: Widowed    Spouse name: Lynleigh Kovack  .  Number of children: 6  . Years of education: Not on file  . Highest education level: Not on file  Occupational History  . Occupation: Retired  Scientific laboratory technician  . Financial resource strain: Not on file  . Food insecurity    Worry: Not on file    Inability: Not on file  . Transportation needs    Medical: Not on file    Non-medical: Not on file  Tobacco Use  . Smoking status: Former Smoker    Packs/day: 0.50    Years: 35.00    Pack years: 17.50    Types: Cigarettes    Quit date: 2004    Years since quitting: 16.6  . Smokeless tobacco: Never Used  Substance and Sexual Activity  . Alcohol use: No    Frequency: Never  . Drug use: No  . Sexual activity: Not on file  Lifestyle  . Physical activity    Days per week: Not on file    Minutes per session: Not on file  . Stress: Not on file  Relationships  . Social Herbalist on phone: Not on file    Gets together: Not on file    Attends religious service: Not on file    Active member of club or organization: Not on file    Attends meetings of clubs or organizations: Not on file    Relationship status: Not on file  . Intimate partner violence    Fear of current or ex partner: Not  on file    Emotionally abused: Not on file    Physically abused: Not on file    Forced sexual activity: Not on file  Other Topics Concern  . Not on file  Social History Narrative  . Not on file     Family History  Problem Relation Age of Onset  . Sudden death Father   . Sudden Cardiac Death Neg Hx      Current Outpatient Medications on File Prior to Visit  Medication Sig Dispense Refill  . allopurinol (ZYLOPRIM) 300 MG tablet Take 150 mg by mouth daily.     Marland Kitchen amLODipine (NORVASC) 10 MG tablet Take 1 tablet (10 mg total) by mouth daily. 30 tablet 3  . Ascorbic Acid (VITAMIN C) 1000 MG tablet Take 1,000 mg by mouth daily.    . Cholecalciferol (VITAMIN D3) 1.25 MG (50000 UT) CAPS Take by mouth daily.    . metoprolol succinate  (TOPROL-XL) 50 MG 24 hr tablet Take 1 tablet (50 mg total) by mouth daily. 90 tablet 0  . Multiple Vitamins-Minerals (MULTIVITAMIN WOMEN PO) Take by mouth daily.    . NON FORMULARY FOCUS FACTOR, TAKE OCCASIONAL    . rivaroxaban (XARELTO) 20 MG TABS tablet Take 1 tablet (20 mg total) by mouth daily with supper. Overdue for follow-up. Needs appointment for future refills. 90 tablet 2   No current facility-administered medications on file prior to visit.     Cardiovascular studies:  EKG 08/01/2018: Sinus rhythm 70 bpm.  Leftward axis.  Otherwise normal EKG.   Echocardiogram 02/06/2017: - Left ventricle: The cavity size was normal. Wall thickness was   increased in a pattern of mild LVH. Systolic function was normal.   The estimated ejection fraction was in the range of 60% to 65%.   Wall motion was normal; there were no regional wall motion   abnormalities. Features are consistent with a pseudonormal left   ventricular filling pattern, with concomitant abnormal relaxation   and increased filling pressure (grade 2 diastolic dysfunction).   Indeterminate filling pressures. - Mitral valve: Mildly calcified annulus. - Left atrium: The atrium was mildly dilated. - Atrial septum: No defect or patent foramen ovale was identified. - Tricuspid valve: There was mild-moderate regurgitation. - Pulmonary arteries: PA peak pressure: 35 mm Hg (S). - Systemic veins: Dilated IVC with normal respiratory variation.   Estimated CVP 8 mmHg.  Recent labs: 12/30/2017: Glucose 94. BUN/Cr 16/0.94. eGFR 57. Na/K 138/4.6. HbA1C 5.4% Chol 175, TG 185, HDL 39, LDL 137.   Review of Systems  Constitution: Negative for decreased appetite, malaise/fatigue, weight gain and weight loss.  HENT: Negative for congestion.   Eyes: Negative for visual disturbance.  Cardiovascular: Negative for chest pain, dyspnea on exertion, leg swelling, palpitations and syncope.  Respiratory: Negative for cough.   Endocrine:  Negative for cold intolerance.  Hematologic/Lymphatic: Does not bruise/bleed easily.  Skin: Negative for itching and rash.  Musculoskeletal: Negative for myalgias.  Gastrointestinal: Negative for abdominal pain, nausea and vomiting.  Genitourinary: Negative for dysuria.  Neurological: Negative for dizziness and weakness.  Psychiatric/Behavioral: The patient is not nervous/anxious.   All other systems reviewed and are negative.        Vitals:   09/20/18 1038  BP: (!) 152/81  Pulse: 77      Objective:   Physical Exam  Not performed. Telephone visit.         Assessment & Recommendations:   85 y.o. Caucasian female with hypertension, hyperlipidemia, paroxysmal atrial fibrillation.  PAF:  CHA2DS2VASc score 4. Annual stroke risk 4%. Continue Xarelto 20 mg daily. Continue metoprolol succinate 50 mg daily.   Hypertension:  Improved, but still suboptimal.  Given leg edema, reduce amlodipine to 5 mg daily.  Increase metoprolol succinate to 75 mg daily, that is 1.5 tab of 50 mg daily.  I will have a telephone visit in 4 weeks.  If heart rate not too low, could increase metoprolol succinate to 100 mg daily.    Hyperlipidemia, carotid bruit: I do not appreciate carotid bruit on exam today. She remains reluctant to try lipid lowering therapy, and would like to make diet and lifestlye changes.   Telephone f/u in 4 weeks.   Nigel Mormon, MD [facility] Pager: [phone] Office: [phone] If no answer Cell [phone]

## 2018-09-20 ENCOUNTER — Encounter: Payer: Self-pay | Admitting: Cardiology

## 2018-09-20 ENCOUNTER — Other Ambulatory Visit: Payer: Self-pay

## 2018-09-20 ENCOUNTER — Telehealth (INDEPENDENT_AMBULATORY_CARE_PROVIDER_SITE_OTHER): Payer: Medicare HMO | Admitting: Cardiology

## 2018-09-20 VITALS — BP 152/81 | HR 77

## 2018-09-20 DIAGNOSIS — R600 Localized edema: Secondary | ICD-10-CM

## 2018-09-20 DIAGNOSIS — I480 Paroxysmal atrial fibrillation: Secondary | ICD-10-CM | POA: Insufficient documentation

## 2018-09-20 DIAGNOSIS — I10 Essential (primary) hypertension: Secondary | ICD-10-CM

## 2018-09-20 MED ORDER — AMLODIPINE BESYLATE 5 MG PO TABS: 5.0000 mg | ORAL_TABLET | Freq: Every day | ORAL | 3 refills | Status: DC

## 2018-10-27 ENCOUNTER — Other Ambulatory Visit: Payer: Self-pay

## 2018-10-27 MED ORDER — METOPROLOL SUCCINATE ER 25 MG PO TB24: 75.0000 mg | ORAL_TABLET | Freq: Every day | ORAL | 1 refills | Status: DC

## 2018-11-01 NOTE — Telephone Encounter (Signed)
Patient seen.

## 2018-12-26 ENCOUNTER — Other Ambulatory Visit: Payer: Self-pay | Admitting: Cardiology

## 2019-01-30 DIAGNOSIS — I700 Atherosclerosis of aorta: Secondary | ICD-10-CM | POA: Diagnosis not present

## 2019-01-30 DIAGNOSIS — I10 Essential (primary) hypertension: Secondary | ICD-10-CM | POA: Diagnosis not present

## 2019-01-30 DIAGNOSIS — N183 Chronic kidney disease, stage 3 unspecified: Secondary | ICD-10-CM | POA: Diagnosis not present

## 2019-01-30 DIAGNOSIS — Z0000 Encounter for general adult medical examination without abnormal findings: Secondary | ICD-10-CM | POA: Diagnosis not present

## 2019-01-30 DIAGNOSIS — M109 Gout, unspecified: Secondary | ICD-10-CM | POA: Diagnosis not present

## 2019-01-30 DIAGNOSIS — E782 Mixed hyperlipidemia: Secondary | ICD-10-CM | POA: Diagnosis not present

## 2019-01-30 DIAGNOSIS — R7303 Prediabetes: Secondary | ICD-10-CM | POA: Diagnosis not present

## 2019-01-30 DIAGNOSIS — I480 Paroxysmal atrial fibrillation: Secondary | ICD-10-CM | POA: Diagnosis not present

## 2019-02-01 DIAGNOSIS — M109 Gout, unspecified: Secondary | ICD-10-CM | POA: Diagnosis not present

## 2019-02-01 DIAGNOSIS — E782 Mixed hyperlipidemia: Secondary | ICD-10-CM | POA: Diagnosis not present

## 2019-02-01 DIAGNOSIS — I10 Essential (primary) hypertension: Secondary | ICD-10-CM | POA: Diagnosis not present

## 2019-02-01 DIAGNOSIS — R7303 Prediabetes: Secondary | ICD-10-CM | POA: Diagnosis not present

## 2019-02-22 ENCOUNTER — Ambulatory Visit: Payer: Medicare HMO | Admitting: Cardiology

## 2019-02-24 ENCOUNTER — Other Ambulatory Visit: Payer: Self-pay | Admitting: Cardiology

## 2019-04-12 ENCOUNTER — Encounter: Payer: Self-pay | Admitting: Cardiology

## 2019-04-12 ENCOUNTER — Ambulatory Visit: Payer: Medicare HMO | Admitting: Cardiology

## 2019-04-12 ENCOUNTER — Other Ambulatory Visit: Payer: Self-pay

## 2019-04-12 VITALS — BP 160/73 | HR 60 | Temp 97.9°F | Resp 18 | Ht 67.0 in | Wt 186.0 lb

## 2019-04-12 DIAGNOSIS — I10 Essential (primary) hypertension: Secondary | ICD-10-CM | POA: Diagnosis not present

## 2019-04-12 DIAGNOSIS — I480 Paroxysmal atrial fibrillation: Secondary | ICD-10-CM | POA: Diagnosis not present

## 2019-04-12 NOTE — Progress Notes (Signed)
Patient referred by [name], MD for atrial fibrillation  Subjective:   Amber Kennedy, female    DOB: [birth year], 86 y.o.   MRN: [id number]   Chief Complaint  Patient presents with  . Hypertension  . Follow-up    6 month    HPI  86 y.o. Caucasian female with hypertension, hyperlipidemia, paroxysmal atrial fibrillation.  Patient does not have any complaints today. She denies chest pain, shortness of breath, palpitations, leg edema, orthopnea, PND, TIA/syncope.  Blood pressure elevated today. She checks it infrequently at home, but is lower than this.    Current Outpatient Medications on File Prior to Visit  Medication Sig Dispense Refill  . allopurinol (ZYLOPRIM) 300 MG tablet Take 150 mg by mouth daily.     Marland Kitchen amLODipine (NORVASC) 5 MG tablet Take 1 tablet (5 mg total) by mouth daily. 90 tablet 3  . Ascorbic Acid (VITAMIN C) 1000 MG tablet Take 1,000 mg by mouth daily.    . Cholecalciferol (VITAMIN D3) 1.25 MG (50000 UT) CAPS Take by mouth daily.    . metoprolol succinate (TOPROL-XL) 25 MG 24 hr tablet TAKE 3 TABLETS BY MOUTH DAILY. 90 tablet 1  . Multiple Vitamins-Minerals (MULTIVITAMIN WOMEN PO) Take by mouth daily.    . NON FORMULARY FOCUS FACTOR, TAKE OCCASIONAL    . rivaroxaban (XARELTO) 20 MG TABS tablet Take 1 tablet (20 mg total) by mouth daily with supper. Overdue for follow-up. Needs appointment for future refills. 90 tablet 2   No current facility-administered medications on file prior to visit.    Cardiovascular studies:  EKG 04/12/2019: Sinus rhythm 58 bpm. Nonspecific T-abnormality.   Echocardiogram 02/06/2017: - Left ventricle: The cavity size was normal. Wall thickness was   increased in a pattern of mild LVH. Systolic function was normal.   The estimated ejection fraction was in the range of 60% to 65%.   Wall motion was normal; there were no regional wall motion   abnormalities. Features are consistent with a pseudonormal left   ventricular  filling pattern, with concomitant abnormal relaxation   and increased filling pressure (grade 2 diastolic dysfunction).   Indeterminate filling pressures. - Mitral valve: Mildly calcified annulus. - Left atrium: The atrium was mildly dilated. - Atrial septum: No defect or patent foramen ovale was identified. - Tricuspid valve: There was mild-moderate regurgitation. - Pulmonary arteries: PA peak pressure: 35 mm Hg (S). - Systemic veins: Dilated IVC with normal respiratory variation.   Estimated CVP 8 mmHg.  Recent labs: 12/30/2017: Glucose 94. BUN/Cr 16/0.94. eGFR 57. Na/K 138/4.6. HbA1C 5.4% Chol 175, TG 185, HDL 39, LDL 137.   Review of Systems  Cardiovascular: Negative for chest pain, dyspnea on exertion, leg swelling, palpitations and syncope.         Vitals:   04/12/19 1036  BP: (!) 160/73  Pulse: 60  Resp: 18  Temp: 97.9 F (36.6 C)  SpO2: 98%      Objective:   Physical Exam  Constitutional: She appears well-developed and well-nourished.  Neck: No JVD present.  Cardiovascular: Normal rate, regular rhythm, normal heart sounds and intact distal pulses.  No murmur heard. Pulmonary/Chest: Effort normal and breath sounds normal. She has no wheezes. She has no rales.  Musculoskeletal:        General: No edema.  Nursing note and vitals reviewed.           Assessment & Recommendations:   86 y.o. Caucasian female with hypertension, hyperlipidemia, paroxysmal atrial fibrillation.  PAF:  CHA2DS2VASc score 4. Annual stroke risk 4%. Continue Xarelto 20 mg daily. Continue metoprolol succinate 75 mg daily.   Hypertension: Remains uncontrolled. She is currently on metoprolol succinate 75 mg daily, and amlodipine 5 mg daily. Will arrange for remote patient monitoring for hypertension. If remains elevated, will add chlorthalidone 12.5 mg daily.  Hyperlipidemia: She is reluctant to try lipid lowering therapy, and would like to make diet and lifestlye changes.    Telephone f/u in 4 weeks.   Nigel Mormon, MD [facility] [place] Pager: [phone] Office: [phone] If no answer Cell [phone]

## 2019-04-23 DIAGNOSIS — I10 Essential (primary) hypertension: Secondary | ICD-10-CM | POA: Diagnosis not present

## 2019-04-25 DIAGNOSIS — I10 Essential (primary) hypertension: Secondary | ICD-10-CM | POA: Diagnosis not present

## 2019-04-26 ENCOUNTER — Other Ambulatory Visit: Payer: Self-pay | Admitting: Cardiology

## 2019-05-25 DIAGNOSIS — I10 Essential (primary) hypertension: Secondary | ICD-10-CM | POA: Diagnosis not present

## 2019-06-22 DIAGNOSIS — I10 Essential (primary) hypertension: Secondary | ICD-10-CM | POA: Diagnosis not present

## 2019-06-27 ENCOUNTER — Other Ambulatory Visit: Payer: Self-pay | Admitting: Cardiology

## 2019-06-27 ENCOUNTER — Other Ambulatory Visit: Payer: Self-pay

## 2019-06-27 DIAGNOSIS — I10 Essential (primary) hypertension: Secondary | ICD-10-CM

## 2019-06-27 MED ORDER — AMLODIPINE BESYLATE 5 MG PO TABS: 10.0000 mg | ORAL_TABLET | Freq: Every day | ORAL | 0 refills | Status: DC

## 2019-06-27 MED ORDER — METOPROLOL SUCCINATE ER 50 MG PO TB24: 75.0000 mg | ORAL_TABLET | Freq: Every day | ORAL | 3 refills | Status: DC

## 2019-06-27 NOTE — Telephone Encounter (Signed)
Called and reviewed with patient. Pt currently taking 3 tablets of 25 mg metoprolol daily for a total daily dose of 75 mg. Pt would prefer to switch to 1.5 tablets of 50 mg metoprolol daily. Home BP readings reviewed with pt. BP readings remain elevated and not at goal. Put currently tolerating amlodipine 5 mg daily without any ADRs. Denies any complains of edema, lightheadedness, dizziness, syncope or falls. Will consider increasing amlodipine to 10 mg daily and reviewing pt response. Pt aware to monitor for ADRs. Pt verbalized understanding. May consider adding chlorthalidone if BP continue to remain elevated. Pt previously on atenolol-chlorthalidone combination that was discontinued in Jan 2019 due to severe dehydration and metabolic abnormalities that resulted in new onset Afib. Pt reports that her symptoms have resolved since then and she remains well nourished and hydrated. Will continue to monitor response to amlodipine dose increase and home BP readings.

## 2019-07-12 ENCOUNTER — Encounter: Payer: Self-pay | Admitting: Cardiology

## 2019-07-12 ENCOUNTER — Other Ambulatory Visit: Payer: Self-pay

## 2019-07-12 ENCOUNTER — Ambulatory Visit: Payer: Medicare HMO | Admitting: Cardiology

## 2019-07-12 VITALS — BP 153/61 | HR 61 | Resp 17 | Ht 67.0 in | Wt 188.0 lb

## 2019-07-12 DIAGNOSIS — I480 Paroxysmal atrial fibrillation: Secondary | ICD-10-CM | POA: Diagnosis not present

## 2019-07-12 DIAGNOSIS — I10 Essential (primary) hypertension: Secondary | ICD-10-CM

## 2019-07-12 MED ORDER — CHLORTHALIDONE 25 MG PO TABS: 12.5000 mg | ORAL_TABLET | Freq: Every day | ORAL | 2 refills | Status: DC

## 2019-07-12 NOTE — Progress Notes (Signed)
Patient referred by [name], MD for atrial fibrillation  Subjective:   Amber Kennedy, female    DOB: 1933/07/26, 86 y.o.   MRN: [id number]   Chief Complaint  Patient presents with  . Hypertension  . Atrial Fibrillation  . Follow-up    3 month    HPI  86 y.o. Caucasian female with hypertension, hyperlipidemia, paroxysmal atrial fibrillation.  Patient does not have any complaints today. She denies chest pain, shortness of breath, palpitations, leg edema, orthopnea, PND, TIA/syncope. Blood pressure remains elevated. However, patient states that she feels tired when SBP is <140 mmHg.  Current Outpatient Medications on File Prior to Visit  Medication Sig Dispense Refill  . allopurinol (ZYLOPRIM) 300 MG tablet Take 150 mg by mouth daily.     Marland Kitchen amLODipine (NORVASC) 5 MG tablet Take 2 tablets (10 mg total) by mouth daily. 60 tablet 0  . Ascorbic Acid (VITAMIN C) 1000 MG tablet Take 1,000 mg by mouth daily.    . Cholecalciferol (VITAMIN D3) 1.25 MG (50000 UT) CAPS Take by mouth daily.    . metoprolol succinate (TOPROL-XL) 50 MG 24 hr tablet Take 1.5 tablets (75 mg total) by mouth daily. 135 tablet 3  . Multiple Vitamins-Minerals (MULTIVITAMIN WOMEN PO) Take by mouth daily.    . NON FORMULARY FOCUS FACTOR, TAKE OCCASIONAL    . rivaroxaban (XARELTO) 20 MG TABS tablet Take 1 tablet (20 mg total) by mouth daily with supper. Overdue for follow-up. Needs appointment for future refills. 90 tablet 2   No current facility-administered medications on file prior to visit.    Cardiovascular studies:  EKG 04/12/2019: Sinus rhythm 58 bpm. Nonspecific T-abnormality.   Echocardiogram 02/06/2017: - Left ventricle: The cavity size was normal. Wall thickness was   increased in a pattern of mild LVH. Systolic function was normal.   The estimated ejection fraction was in the range of 60% to 65%.   Wall motion was normal; there were no regional wall motion   abnormalities. Features are  consistent with a pseudonormal left   ventricular filling pattern, with concomitant abnormal relaxation   and increased filling pressure (grade 2 diastolic dysfunction).   Indeterminate filling pressures. - Mitral valve: Mildly calcified annulus. - Left atrium: The atrium was mildly dilated. - Atrial septum: No defect or patent foramen ovale was identified. - Tricuspid valve: There was mild-moderate regurgitation. - Pulmonary arteries: PA peak pressure: 35 mm Hg (S). - Systemic veins: Dilated IVC with normal respiratory variation.   Estimated CVP 8 mmHg.  Recent labs: 02/01/2019: Glucose 115, BUN/Cr 16/0.9. EGFR 57 HbA1C 5.3% Chol 189, TG 259, HDL 42, LDL 102 TSH 6.3 normal  12/30/2017: Glucose 94. BUN/Cr 16/0.94. eGFR 57. Na/K 138/4.6. HbA1C 5.4% Chol 175, TG 185, HDL 39, LDL 137.   Review of Systems  Cardiovascular: Negative for chest pain, dyspnea on exertion, leg swelling, palpitations and syncope.         Vitals:   07/12/19 1440  BP: (!) 153/61  Pulse: 61  Resp: 17  SpO2: 96%      Objective:   Physical Exam Vitals and nursing note reviewed.  Constitutional:      Appearance: She is well-developed.  Neck:     Vascular: No JVD.  Cardiovascular:     Rate and Rhythm: Normal rate and regular rhythm.     Pulses: Intact distal pulses.     Heart sounds: Normal heart sounds. No murmur heard.   Pulmonary:     Effort: Pulmonary effort  is normal.     Breath sounds: Normal breath sounds. No wheezing or rales.  Musculoskeletal:     Right lower leg: Edema (Trace) present.     Left lower leg: Edema (Trace) present.             Assessment & Recommendations:   86 y.o. Caucasian female with hypertension, hyperlipidemia, paroxysmal atrial fibrillation.  PAF: CHA2DS2VASc score 4. Annual stroke risk 4%. Continue Xarelto 20 mg daily. Continue metoprolol succinate 75 mg daily.   Hypertension: Remains uncontrolled. After much discussion, she agreed to take  chlorthalidone 12.5 mg daily, in addition to metoprolol succinate 75 mg in the evening and amlodipine 5 mg in the morning Continue remote patient monitoring.   Hyperlipidemia: She is reluctant to try lipid lowering therapy, and would like to make diet and lifestlye changes.  LDL is down to 102.  F/u in 2 months  Cherylee Rawlinson Esther Hardy, MD [facility] [place] Pager: [phone] Office: [phone] If no answer Cell [phone]

## 2019-07-19 ENCOUNTER — Ambulatory Visit: Payer: Medicare HMO | Admitting: Cardiology

## 2019-07-24 ENCOUNTER — Telehealth: Payer: Self-pay | Admitting: Pharmacist

## 2019-07-24 NOTE — Telephone Encounter (Signed)
Talked to pt for [facility] check in. Pt reports to be doing well overall. Reviewed med list together. Pt was started on chlorthalidone 12.5 mg at last OV on 07/12/19. Pt reports that she took it for a couple of days and reported feeling foggy and dizzy throughout the day since the new med change. Reports to feeling lightheaded and dizzy that last for most of the day. Denies any recent episodes of syncopes or falls. Pt stopped taking chlorthalidone since 07/17/19. Reports that her symptoms improved since and had no recurrence in dizziness and lightheadedness symptoms. Pt insists of no longer wanting to taking the chlorthalidone anymore. BP readings have remains stable and slightly above goal. Pt would like to continue monitoring her home BP readings for a few more days before considering any other medication changes.

## 2019-07-25 DIAGNOSIS — I10 Essential (primary) hypertension: Secondary | ICD-10-CM | POA: Diagnosis not present

## 2019-07-31 ENCOUNTER — Other Ambulatory Visit: Payer: Self-pay | Admitting: Pharmacist

## 2019-07-31 DIAGNOSIS — I10 Essential (primary) hypertension: Secondary | ICD-10-CM

## 2019-07-31 MED ORDER — AMLODIPINE BESYLATE 10 MG PO TABS: 10.0000 mg | ORAL_TABLET | Freq: Every day | ORAL | 2 refills | Status: DC

## 2019-08-25 DIAGNOSIS — I10 Essential (primary) hypertension: Secondary | ICD-10-CM | POA: Diagnosis not present

## 2019-09-13 ENCOUNTER — Ambulatory Visit: Payer: Medicare HMO | Admitting: Cardiology

## 2019-09-20 ENCOUNTER — Ambulatory Visit: Payer: Medicare HMO | Admitting: Cardiology

## 2019-09-20 ENCOUNTER — Encounter: Payer: Self-pay | Admitting: Cardiology

## 2019-09-20 ENCOUNTER — Other Ambulatory Visit: Payer: Self-pay

## 2019-09-20 VITALS — BP 179/74 | HR 63 | Resp 17 | Ht 67.0 in | Wt 185.0 lb

## 2019-09-20 DIAGNOSIS — I10 Essential (primary) hypertension: Secondary | ICD-10-CM | POA: Diagnosis not present

## 2019-09-20 DIAGNOSIS — I480 Paroxysmal atrial fibrillation: Secondary | ICD-10-CM

## 2019-09-20 DIAGNOSIS — R600 Localized edema: Secondary | ICD-10-CM

## 2019-09-20 MED ORDER — FUROSEMIDE 20 MG PO TABS: 20.0000 mg | ORAL_TABLET | ORAL | 2 refills | Status: DC | PRN
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
## 2019-09-25 DIAGNOSIS — S299XXA Unspecified injury of thorax, initial encounter: Secondary | ICD-10-CM | POA: Diagnosis not present

## 2019-09-25 DIAGNOSIS — S6992XA Unspecified injury of left wrist, hand and finger(s), initial encounter: Secondary | ICD-10-CM | POA: Diagnosis not present

## 2019-09-25 DIAGNOSIS — W19XXXA Unspecified fall, initial encounter: Secondary | ICD-10-CM | POA: Diagnosis not present

## 2019-09-25 DIAGNOSIS — R55 Syncope and collapse: Secondary | ICD-10-CM | POA: Diagnosis not present

## 2019-09-26 DIAGNOSIS — M79645 Pain in left finger(s): Secondary | ICD-10-CM | POA: Diagnosis not present

## 2019-09-26 DIAGNOSIS — S62525A Nondisplaced fracture of distal phalanx of left thumb, initial encounter for closed fracture: Secondary | ICD-10-CM | POA: Diagnosis not present

## 2020-03-03 ENCOUNTER — Other Ambulatory Visit: Payer: Self-pay

## 2020-03-03 DIAGNOSIS — I480 Paroxysmal atrial fibrillation: Secondary | ICD-10-CM

## 2020-03-03 MED ORDER — RIVAROXABAN 20 MG PO TABS: 20.0000 mg | ORAL_TABLET | Freq: Every day | ORAL | 0 refills | Status: DC

## 2020-03-12 DIAGNOSIS — Z0000 Encounter for general adult medical examination without abnormal findings: Secondary | ICD-10-CM | POA: Diagnosis not present

## 2020-03-12 DIAGNOSIS — M109 Gout, unspecified: Secondary | ICD-10-CM | POA: Diagnosis not present

## 2020-03-12 DIAGNOSIS — I480 Paroxysmal atrial fibrillation: Secondary | ICD-10-CM | POA: Diagnosis not present

## 2020-03-12 DIAGNOSIS — N183 Chronic kidney disease, stage 3 unspecified: Secondary | ICD-10-CM | POA: Diagnosis not present

## 2020-03-12 DIAGNOSIS — R7303 Prediabetes: Secondary | ICD-10-CM | POA: Diagnosis not present

## 2020-03-12 DIAGNOSIS — Z1389 Encounter for screening for other disorder: Secondary | ICD-10-CM | POA: Diagnosis not present

## 2020-03-12 DIAGNOSIS — I700 Atherosclerosis of aorta: Secondary | ICD-10-CM | POA: Diagnosis not present

## 2020-03-12 DIAGNOSIS — E782 Mixed hyperlipidemia: Secondary | ICD-10-CM | POA: Diagnosis not present

## 2020-03-12 DIAGNOSIS — I10 Essential (primary) hypertension: Secondary | ICD-10-CM | POA: Diagnosis not present

## 2020-03-24 ENCOUNTER — Ambulatory Visit: Payer: Medicare HMO | Admitting: Cardiology

## 2020-03-24 ENCOUNTER — Encounter: Payer: Self-pay | Admitting: Cardiology

## 2020-03-24 ENCOUNTER — Other Ambulatory Visit: Payer: Self-pay

## 2020-03-24 VITALS — BP 173/72 | HR 68 | Temp 97.7°F | Resp 17 | Ht 67.0 in | Wt 180.0 lb

## 2020-03-24 DIAGNOSIS — I480 Paroxysmal atrial fibrillation: Secondary | ICD-10-CM | POA: Diagnosis not present

## 2020-03-24 DIAGNOSIS — I10 Essential (primary) hypertension: Secondary | ICD-10-CM | POA: Diagnosis not present

## 2020-03-24 MED ORDER — METOPROLOL SUCCINATE ER 100 MG PO TB24: 100.0000 mg | ORAL_TABLET | Freq: Every day | ORAL | 2 refills | Status: DC

## 2020-03-24 NOTE — Progress Notes (Signed)
Patient referred by [name], MD for atrial fibrillation  Subjective:   Amber Kennedy, female    DOB: 1934/01/14, 87 y.o.   MRN: [id number]   Chief Complaint  Patient presents with  . Hypertension  . Follow-up   HPI  87 y.o. Caucasian female with hypertension, hyperlipidemia, paroxysmal atrial fibrillation.  Patient is doing well.  She denies chest pain, shortness of breath, palpitations, leg edema, orthopnea, PND, TIA/syncope. She has had occasional dark stools, but does not happen every day.  Current Outpatient Medications on File Prior to Visit  Medication Sig Dispense Refill  . allopurinol (ZYLOPRIM) 300 MG tablet Take 150 mg by mouth daily.     Marland Kitchen amLODipine (NORVASC) 10 MG tablet Take 1 tablet (10 mg total) by mouth daily. 90 tablet 2  . Ascorbic Acid (VITAMIN C) 1000 MG tablet Take 1,000 mg by mouth daily.    . Cholecalciferol (VITAMIN D3) 1.25 MG (50000 UT) CAPS Take by mouth daily.    . furosemide (LASIX) 20 MG tablet Take 1 tablet (20 mg total) by mouth as needed. 60 tablet 2  . metoprolol succinate (TOPROL-XL) 50 MG 24 hr tablet Take 1.5 tablets (75 mg total) by mouth daily. 135 tablet 3  . Multiple Vitamins-Minerals (MULTIVITAMIN WOMEN PO) Take by mouth daily.    . NON FORMULARY FOCUS FACTOR, TAKE OCCASIONAL    . rivaroxaban (XARELTO) 20 MG TABS tablet Take 1 tablet (20 mg total) by mouth daily with supper. 90 tablet 0   No current facility-administered medications on file prior to visit.    Cardiovascular studies:  EKG 03/24/2020: Sinus rhythm 67 bpm  Left axis deviation Possible old anteroseptal infarct  Echocardiogram 02/06/2017: - Left ventricle: The cavity size was normal. Wall thickness was   increased in a pattern of mild LVH. Systolic function was normal.   The estimated ejection fraction was in the range of 60% to 65%.   Wall motion was normal; there were no regional wall motion   abnormalities. Features are consistent with a pseudonormal  left   ventricular filling pattern, with concomitant abnormal relaxation   and increased filling pressure (grade 2 diastolic dysfunction).   Indeterminate filling pressures. - Mitral valve: Mildly calcified annulus. - Left atrium: The atrium was mildly dilated. - Atrial septum: No defect or patent foramen ovale was identified. - Tricuspid valve: There was mild-moderate regurgitation. - Pulmonary arteries: PA peak pressure: 35 mm Hg (S). - Systemic veins: Dilated IVC with normal respiratory variation.   Estimated CVP 8 mmHg.  Recent labs: 03/13/2020: Glucose 88, BUN/Cr 15/0.94. EGFR 57. Na/K 137/4.5. Rest of the CMP normal HbA1C 5.6% Chol 155, TG 150, HDL 46, LDL 83  02/01/2019: Glucose 115, BUN/Cr 16/0.9. EGFR 57 HbA1C 5.3% Chol 189, TG 259, HDL 42, LDL 102 TSH 6.3 normal  12/30/2017: Glucose 94. BUN/Cr 16/0.94. eGFR 57. Na/K 138/4.6. HbA1C 5.4% Chol 175, TG 185, HDL 39, LDL 137.   Review of Systems  Cardiovascular: Negative for chest pain, dyspnea on exertion, leg swelling, palpitations and syncope.          Vitals:   03/24/20 1351  BP: (!) 173/72  Pulse: 68  Resp: 17  Temp: 97.7 F (36.5 C)  SpO2: 95%      Objective:   Physical Exam Vitals and nursing note reviewed.  Constitutional:      Appearance: She is well-developed.  Neck:     Vascular: No JVD.  Cardiovascular:     Rate and Rhythm: Normal rate  and regular rhythm.     Pulses: Intact distal pulses.     Heart sounds: Normal heart sounds. No murmur heard.   Pulmonary:     Effort: Pulmonary effort is normal.     Breath sounds: Normal breath sounds. No wheezing or rales.  Musculoskeletal:     Right lower leg: Edema (Trace) present.     Left lower leg: Edema (Trace) present.             Assessment & Recommendations:   87 y.o. Caucasian female with hypertension, hyperlipidemia, paroxysmal atrial fibrillation.  PAF: CHA2DS2VASc score 4. Annual stroke risk 4%. Continue Xarelto 20 mg  daily. She has occasional dark stools, but denies hematochezia. Monitor for now. If recurrent, will need further GI workup  Hypertension: White coat hypertension with documented lower blood pressures at home. Continue on amlodipine 10 mg Change metoprolol succinate to 100 mg daily for east of administration.  Hyperlipidemia: She is reluctant to try lipid lowering therapy, and would like to make diet and lifestlye changes.  LDL is down to 102.  F/u in 6 months  Manish Esther Hardy, MD [facility] [place] Pager: [phone] Office: [phone] If no answer Cell [phone]

## 2020-04-27 ENCOUNTER — Encounter (HOSPITAL_COMMUNITY): Payer: Self-pay | Admitting: Emergency Medicine

## 2020-04-27 ENCOUNTER — Emergency Department (HOSPITAL_COMMUNITY): Payer: Medicare HMO

## 2020-04-27 ENCOUNTER — Inpatient Hospital Stay (HOSPITAL_COMMUNITY)
Admission: EM | Admit: 2020-04-27 | Discharge: 2020-05-02 | DRG: 041 | Disposition: A | Discharge status: Home / Self Care | Payer: Medicare HMO | Attending: Internal Medicine | Admitting: Internal Medicine

## 2020-04-27 ENCOUNTER — Observation Stay (HOSPITAL_COMMUNITY): Payer: Medicare HMO

## 2020-04-27 ENCOUNTER — Other Ambulatory Visit: Payer: Self-pay

## 2020-04-27 DIAGNOSIS — I2510 Atherosclerotic heart disease of native coronary artery without angina pectoris: Secondary | ICD-10-CM | POA: Diagnosis present

## 2020-04-27 DIAGNOSIS — I6522 Occlusion and stenosis of left carotid artery: Secondary | ICD-10-CM | POA: Diagnosis not present

## 2020-04-27 DIAGNOSIS — G459 Transient cerebral ischemic attack, unspecified: Secondary | ICD-10-CM | POA: Diagnosis present

## 2020-04-27 DIAGNOSIS — I671 Cerebral aneurysm, nonruptured: Secondary | ICD-10-CM | POA: Diagnosis present

## 2020-04-27 DIAGNOSIS — Z91040 Latex allergy status: Secondary | ICD-10-CM

## 2020-04-27 DIAGNOSIS — I517 Cardiomegaly: Secondary | ICD-10-CM | POA: Diagnosis not present

## 2020-04-27 DIAGNOSIS — E871 Hypo-osmolality and hyponatremia: Secondary | ICD-10-CM | POA: Diagnosis present

## 2020-04-27 DIAGNOSIS — R4781 Slurred speech: Secondary | ICD-10-CM | POA: Diagnosis not present

## 2020-04-27 DIAGNOSIS — G8190 Hemiplegia, unspecified affecting unspecified side: Secondary | ICD-10-CM | POA: Diagnosis not present

## 2020-04-27 DIAGNOSIS — Z888 Allergy status to other drugs, medicaments and biological substances status: Secondary | ICD-10-CM

## 2020-04-27 DIAGNOSIS — Z87891 Personal history of nicotine dependence: Secondary | ICD-10-CM | POA: Diagnosis not present

## 2020-04-27 DIAGNOSIS — I10 Essential (primary) hypertension: Secondary | ICD-10-CM

## 2020-04-27 DIAGNOSIS — I169 Hypertensive crisis, unspecified: Secondary | ICD-10-CM | POA: Diagnosis not present

## 2020-04-27 DIAGNOSIS — R001 Bradycardia, unspecified: Secondary | ICD-10-CM | POA: Diagnosis not present

## 2020-04-27 DIAGNOSIS — Z7901 Long term (current) use of anticoagulants: Secondary | ICD-10-CM | POA: Diagnosis not present

## 2020-04-27 DIAGNOSIS — I6523 Occlusion and stenosis of bilateral carotid arteries: Secondary | ICD-10-CM | POA: Diagnosis not present

## 2020-04-27 DIAGNOSIS — Z959 Presence of cardiac and vascular implant and graft, unspecified: Secondary | ICD-10-CM

## 2020-04-27 DIAGNOSIS — M109 Gout, unspecified: Secondary | ICD-10-CM | POA: Diagnosis present

## 2020-04-27 DIAGNOSIS — R531 Weakness: Secondary | ICD-10-CM | POA: Diagnosis not present

## 2020-04-27 DIAGNOSIS — Z880 Allergy status to penicillin: Secondary | ICD-10-CM

## 2020-04-27 DIAGNOSIS — H547 Unspecified visual loss: Secondary | ICD-10-CM | POA: Diagnosis present

## 2020-04-27 DIAGNOSIS — I455 Other specified heart block: Secondary | ICD-10-CM | POA: Diagnosis not present

## 2020-04-27 DIAGNOSIS — Z20822 Contact with and (suspected) exposure to covid-19: Secondary | ICD-10-CM | POA: Diagnosis present

## 2020-04-27 DIAGNOSIS — I4891 Unspecified atrial fibrillation: Secondary | ICD-10-CM | POA: Diagnosis not present

## 2020-04-27 DIAGNOSIS — H3413 Central retinal artery occlusion, bilateral: Secondary | ICD-10-CM | POA: Diagnosis present

## 2020-04-27 DIAGNOSIS — E785 Hyperlipidemia, unspecified: Secondary | ICD-10-CM | POA: Diagnosis present

## 2020-04-27 DIAGNOSIS — I4892 Unspecified atrial flutter: Secondary | ICD-10-CM | POA: Diagnosis present

## 2020-04-27 DIAGNOSIS — I495 Sick sinus syndrome: Secondary | ICD-10-CM | POA: Diagnosis present

## 2020-04-27 DIAGNOSIS — E7800 Pure hypercholesterolemia, unspecified: Secondary | ICD-10-CM | POA: Diagnosis present

## 2020-04-27 DIAGNOSIS — Z79899 Other long term (current) drug therapy: Secondary | ICD-10-CM | POA: Diagnosis not present

## 2020-04-27 DIAGNOSIS — Z66 Do not resuscitate: Secondary | ICD-10-CM | POA: Diagnosis present

## 2020-04-27 DIAGNOSIS — I110 Hypertensive heart disease with heart failure: Secondary | ICD-10-CM | POA: Diagnosis present

## 2020-04-27 DIAGNOSIS — M50322 Other cervical disc degeneration at C5-C6 level: Secondary | ICD-10-CM | POA: Diagnosis not present

## 2020-04-27 DIAGNOSIS — I480 Paroxysmal atrial fibrillation: Secondary | ICD-10-CM | POA: Diagnosis present

## 2020-04-27 DIAGNOSIS — I272 Pulmonary hypertension, unspecified: Secondary | ICD-10-CM | POA: Diagnosis present

## 2020-04-27 DIAGNOSIS — I5032 Chronic diastolic (congestive) heart failure: Secondary | ICD-10-CM | POA: Diagnosis present

## 2020-04-27 DIAGNOSIS — R279 Unspecified lack of coordination: Secondary | ICD-10-CM | POA: Diagnosis present

## 2020-04-27 DIAGNOSIS — M50323 Other cervical disc degeneration at C6-C7 level: Secondary | ICD-10-CM | POA: Diagnosis not present

## 2020-04-27 DIAGNOSIS — I771 Stricture of artery: Secondary | ICD-10-CM | POA: Diagnosis not present

## 2020-04-27 DIAGNOSIS — R29810 Facial weakness: Secondary | ICD-10-CM | POA: Diagnosis not present

## 2020-04-27 DIAGNOSIS — G319 Degenerative disease of nervous system, unspecified: Secondary | ICD-10-CM | POA: Diagnosis not present

## 2020-04-27 LAB — COMPREHENSIVE METABOLIC PANEL
ALT: 16 U/L (ref 0–44)
AST: 19 U/L (ref 15–41)
Albumin: 3.8 g/dL (ref 3.5–5.0)
Alkaline Phosphatase: 63 U/L (ref 38–126)
Anion gap: 9 (ref 5–15)
BUN: 20 mg/dL (ref 8–23)
CO2: 23 mmol/L (ref 22–32)
Calcium: 9.6 mg/dL (ref 8.9–10.3)
Chloride: 106 mmol/L (ref 98–111)
Creatinine, Ser: 1.23 mg/dL — ABNORMAL HIGH (ref 0.44–1.00)
GFR, Estimated: 43 mL/min — ABNORMAL LOW (ref >60)
Glucose, Bld: 120 mg/dL — ABNORMAL HIGH (ref 70–99)
Potassium: 4.7 mmol/L (ref 3.5–5.1)
Sodium: 138 mmol/L (ref 135–145)
Total Bilirubin: 0.7 mg/dL (ref 0.3–1.2)
Total Protein: 7 g/dL (ref 6.5–8.1)

## 2020-04-27 LAB — CBC
HCT: 44.8 % (ref 36.0–46.0)
Hemoglobin: 14.6 g/dL (ref 12.0–15.0)
MCH: 30.5 pg (ref 26.0–34.0)
MCHC: 32.6 g/dL (ref 30.0–36.0)
MCV: 93.7 fL (ref 80.0–100.0)
Platelets: 272 10*3/uL (ref 150–400)
RBC: 4.78 MIL/uL (ref 3.87–5.11)
RDW: 12.9 % (ref 11.5–15.5)
WBC: 9.2 10*3/uL (ref 4.0–10.5)
nRBC: 0 % (ref 0.0–0.2)

## 2020-04-27 LAB — DIFFERENTIAL
Abs Immature Granulocytes: 0.04 10*3/uL (ref 0.00–0.07)
Basophils Absolute: 0 10*3/uL (ref 0.0–0.1)
Basophils Relative: 0 %
Eosinophils Absolute: 0.1 10*3/uL (ref 0.0–0.5)
Eosinophils Relative: 1 %
Immature Granulocytes: 0 %
Lymphocytes Relative: 23 %
Lymphs Abs: 2.1 10*3/uL (ref 0.7–4.0)
Monocytes Absolute: 0.5 10*3/uL (ref 0.1–1.0)
Monocytes Relative: 5 %
Neutro Abs: 6.5 10*3/uL (ref 1.7–7.7)
Neutrophils Relative %: 71 %

## 2020-04-27 LAB — ETHANOL: Alcohol, Ethyl (B): <10 mg/dL (ref <10)

## 2020-04-27 LAB — PROTIME-INR
INR: 1.4 — ABNORMAL HIGH (ref 0.8–1.2)
Prothrombin Time: 16.5 seconds — ABNORMAL HIGH (ref 11.4–15.2)

## 2020-04-27 LAB — RESP PANEL BY RT-PCR (FLU A&B, COVID) ARPGX2
Influenza A by PCR: NEGATIVE
Influenza B by PCR: NEGATIVE
SARS Coronavirus 2 by RT PCR: NEGATIVE

## 2020-04-27 LAB — APTT: aPTT: 35 seconds (ref 24–36)

## 2020-04-27 IMAGING — MR MR MRA HEAD W/O CM
1 series · 16 of 16 positions shown · non-contrast
Comparison: CT head without contrast [DATE]

CLINICAL DATA: TIA. Episode of slurred speech and facial droop.
Left hand weakness. Symptoms lasted 20 minutes.



[Series 5: ax (id) · axial · 1.0mm · 0.43mm/px · z∈[-101,-15]mm · 16 of 176 slices shown]
[im 1/176]
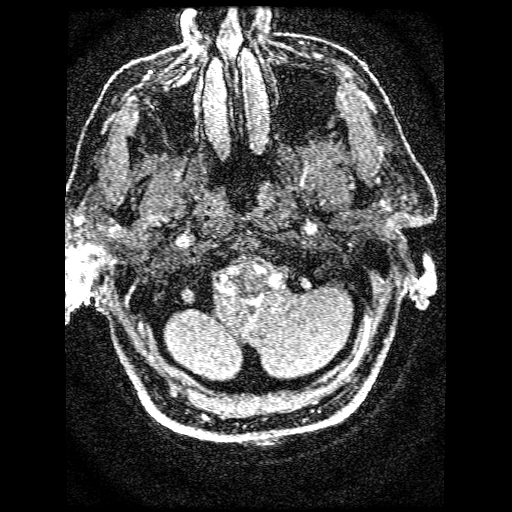
[im 12/176]
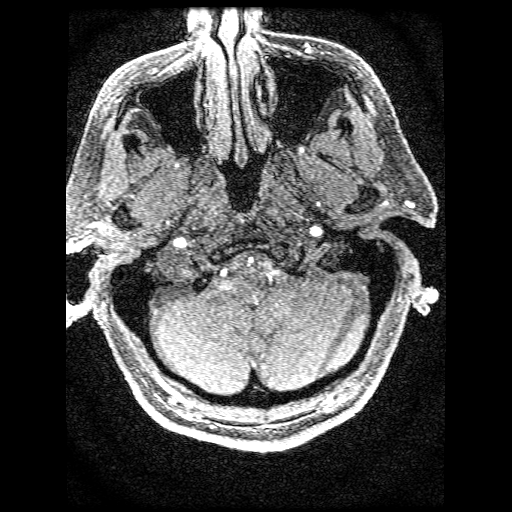
[im 24/176]
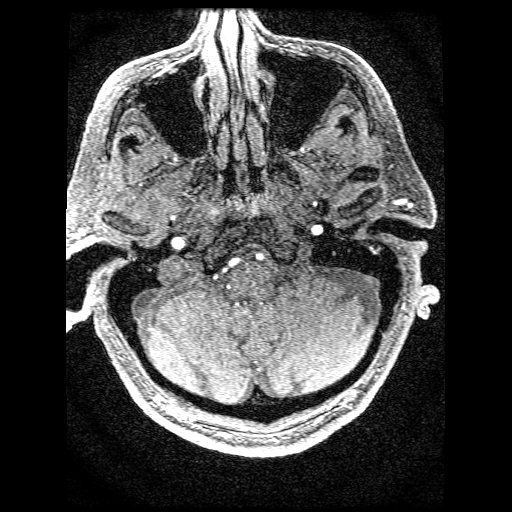
[im 36/176]
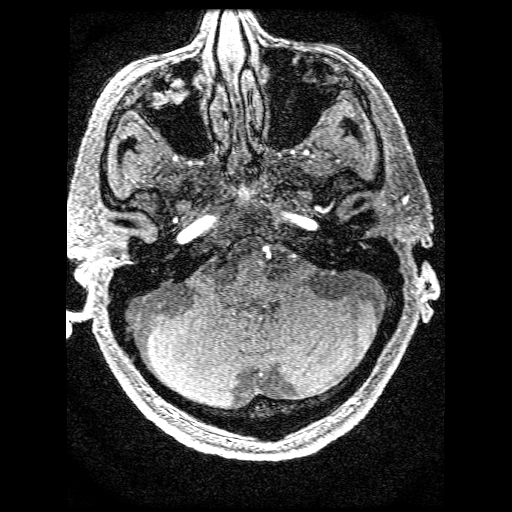
[im 47/176]
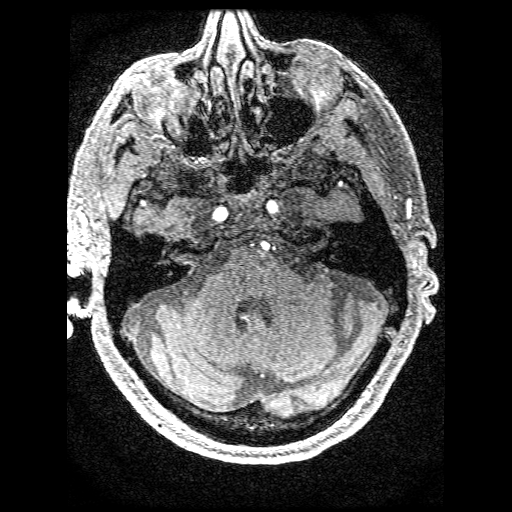
[im 59/176]
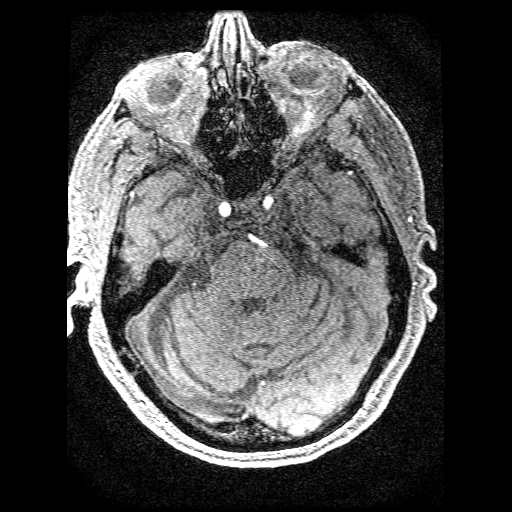
[im 71/176]
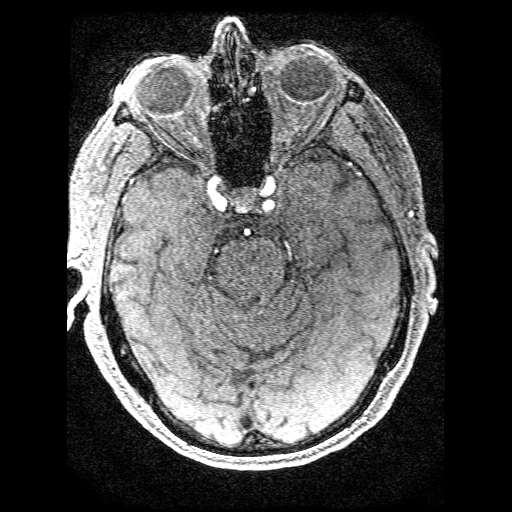
[im 82/176]
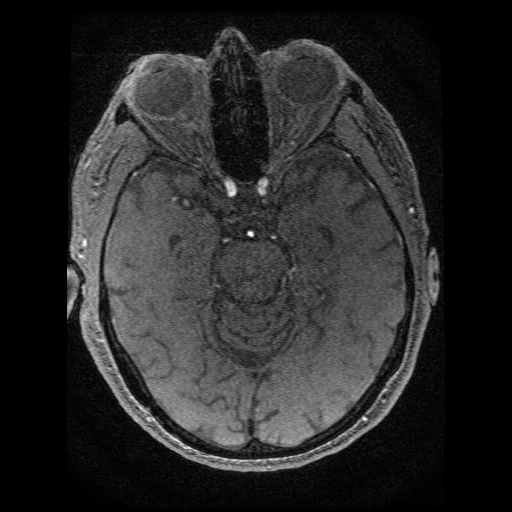
[im 94/176]
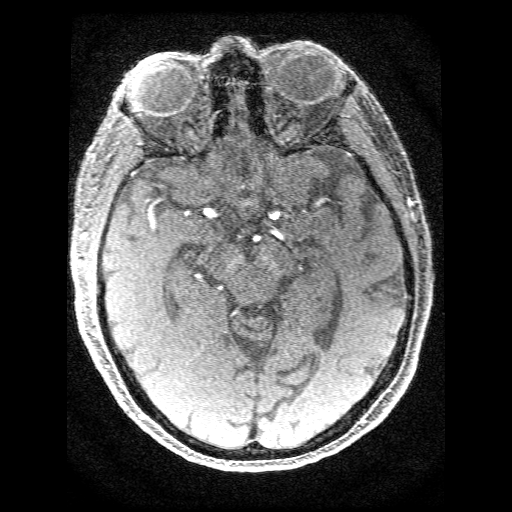
[im 106/176]
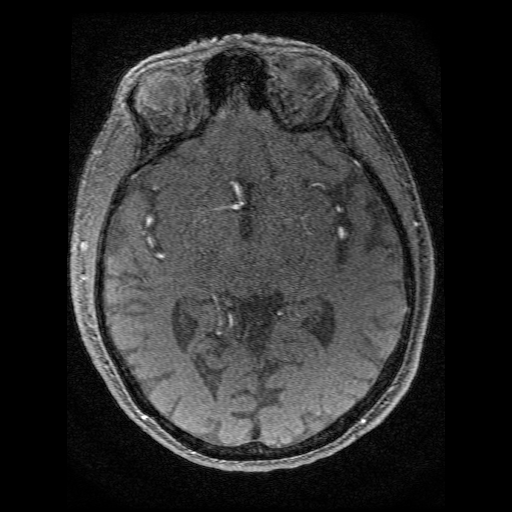
[im 117/176]
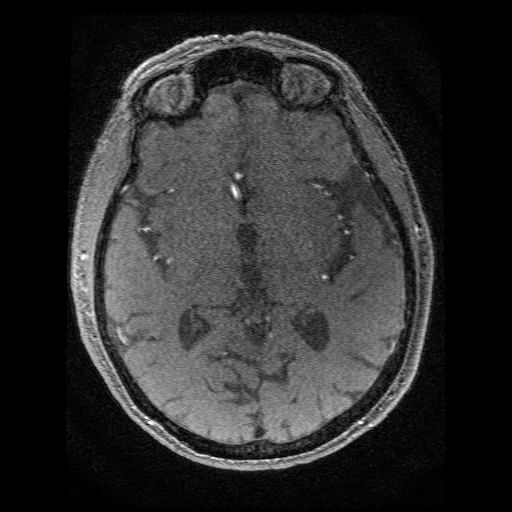
[im 129/176]
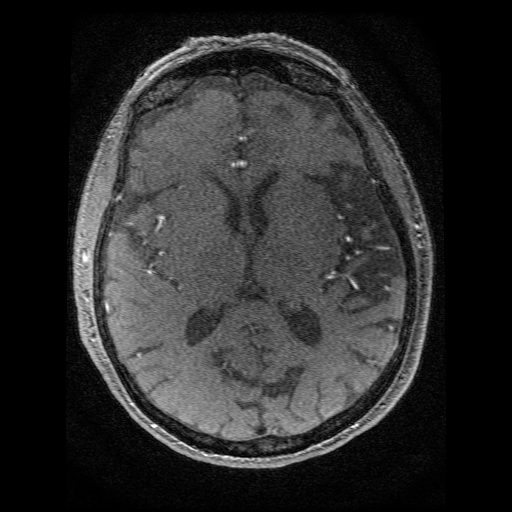
[im 141/176]
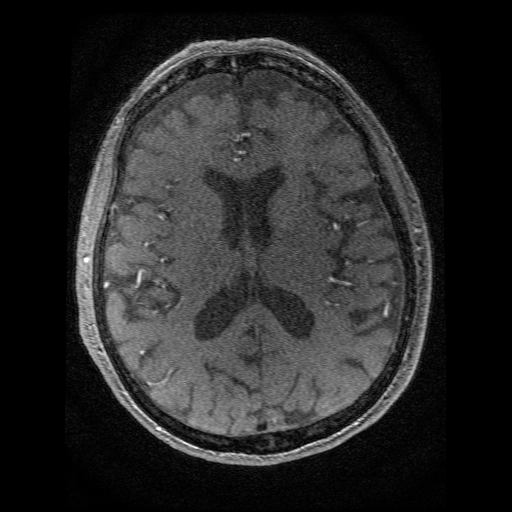
[im 152/176]
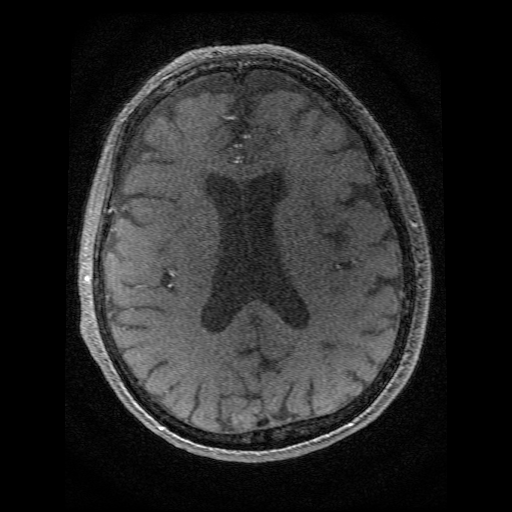
[im 164/176]
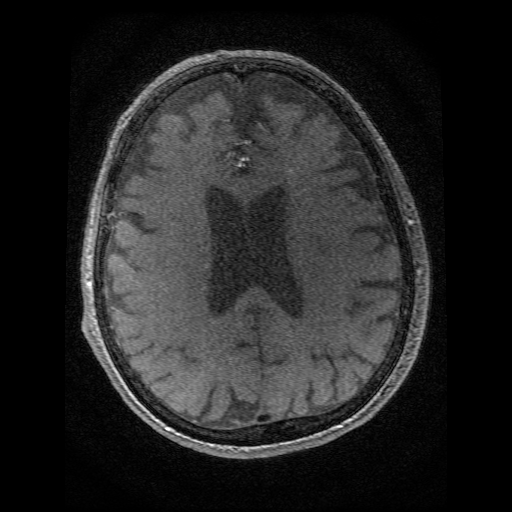
[im 176/176]
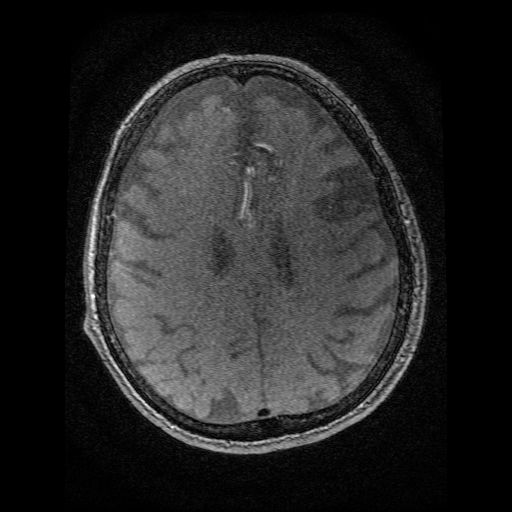

[16 of 16 positions shown; findings below may reference images not displayed]

FINDINGS: MRI HEAD FINDINGS

Brain: Moderate generalized atrophy and white matter disease is
present bilaterally. Asymmetric subcortical white matter changes are
present in the right frontal operculum and corona radiata. The
ventricles are proportionate to the degree of atrophy. No
significant extraaxial fluid collection is present.

Basal ganglia are within normal limits. The brainstem and cerebellum
are within normal limits. The internal auditory canals are within
normal limits.

Vascular: Flow is present in the major intracranial arteries.

Skull and upper cervical spine: The craniocervical junction is
normal. Upper cervical spine is within normal limits. Marrow signal
is unremarkable.

Sinuses/Orbits: The paranasal sinuses and mastoid air cells are
clear. The globes and orbits are within normal limits.

MRA HEAD FINDINGS

The internal carotid arteries are within normal limits from the high
cervical segments through the ICA termini bilaterally. The A1 and M1
segments are normal. In a 1.5 mm right anterior communicating artery
aneurysm is noted. MCA bifurcations are within normal limits.
Moderate 10 UA shin of distal MCA branch vessels is present
bilaterally without a significant proximal stenosis or occlusion.

Vertebral arteries are codominant. Right PICA origin visualized and
normal. Left AICA is dominant. Mild narrowing is present in the
distal vertebral artery at the vertebrobasilar junction. The basilar
artery is normal. Left posterior cerebral artery originates from
basilar tip. The right posterior cerebral artery is of fetal type.
Attenuation PCA branch vessels is noted bilaterally

MRA NECK FINDINGS

Time-of-flight images demonstrate a 3 vessel arch configuration.
Significant signal loss is noted at the proximal left internal
carotid artery with decreased more distal signal. There is
tortuosity of the internal carotid arteries bilaterally. Flow is
antegrade in the vertebral arteries bilaterally.
IMPRESSION: 1. No acute intracranial abnormality.
2. Moderate generalized atrophy and white matter disease likely
reflects the sequela of chronic microvascular ischemia.
3. No significant proximal stenosis, aneurysm, or branch vessel
occlusion within the Circle of Willis.
4. 1.5 mm right anterior communicating artery aneurysm.
5. Moderate distal small vessel disease in the anterior and
posterior circulation without other significant proximal stenosis,
aneurysm, or branch vessel occlusion.
6. Moderate to high-grade stenosis of the proximal left internal
carotid artery suggest on the time-of-flight images. This could be
confirmed with carotid Doppler study or CTA. It is contralateral to
the symptomatic side.

## 2020-04-27 IMAGING — CT CT HEAD W/O CM
4 series · 14 of 47 positions shown, 16 images · non-contrast
Comparison: None.

CLINICAL DATA: Transient ischemic attack. Slurred speech and
left-sided facial droop.

EXAM:
CT HEAD WITHOUT CONTRAST
TECHNIQUE: Contiguous axial images were obtained from the base of the skull
through the vertex without intravenous contrast.

[Series 3: head without · axial · non-contrast · 0.43mm/px · z∈[-72,+38]mm · 6 of 32 slices shown, 8 images]
[im 5/32  brain]
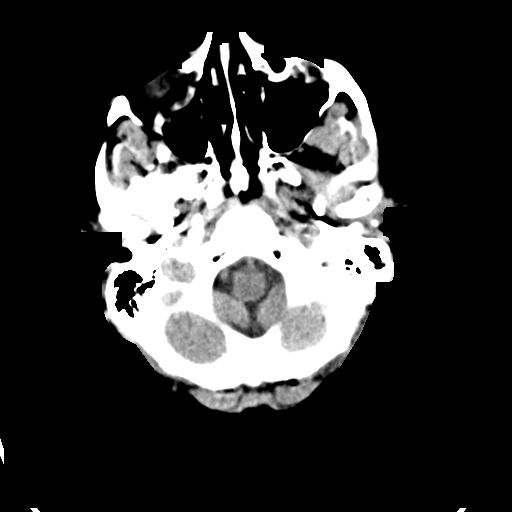
[im 5/32  bone]
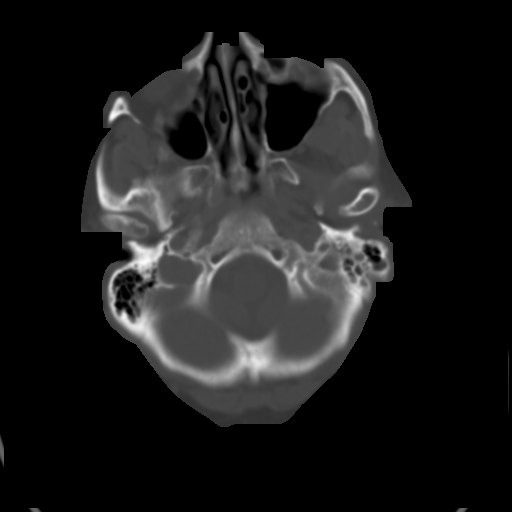
[im 9/32  brain]
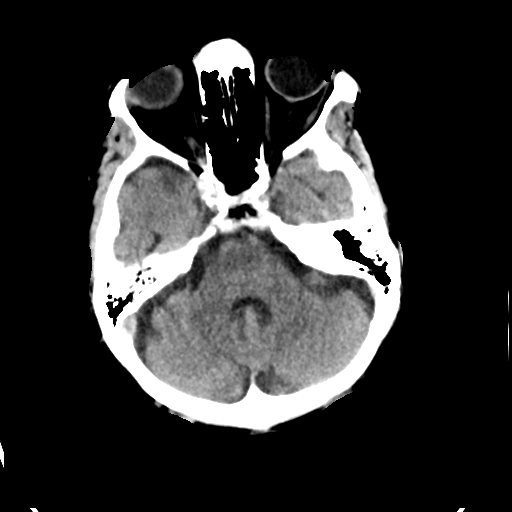
[im 14/32  brain]
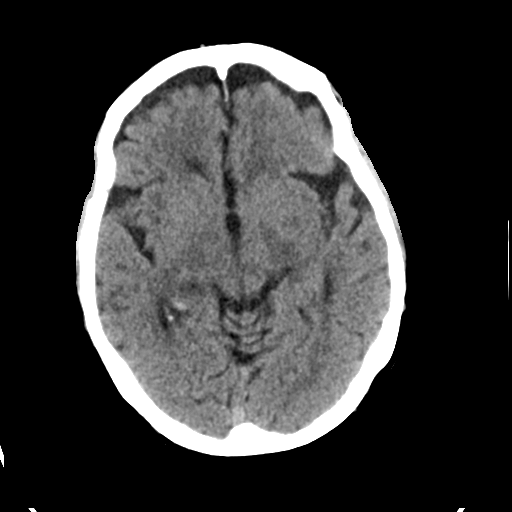
[im 18/32  brain]
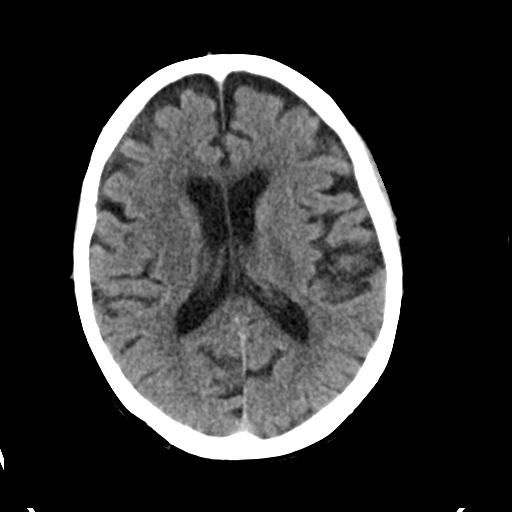
[im 23/32  brain]
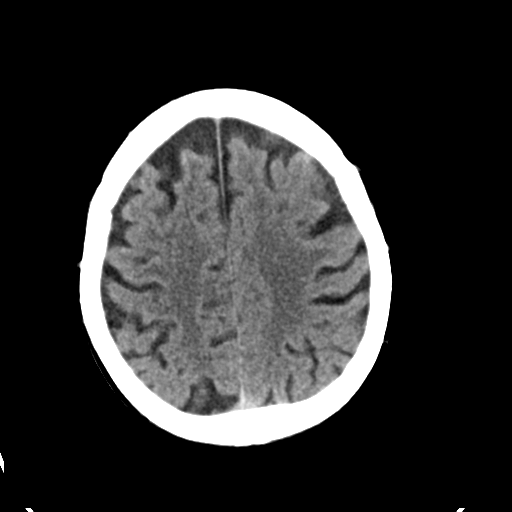
[im 23/32  bone]
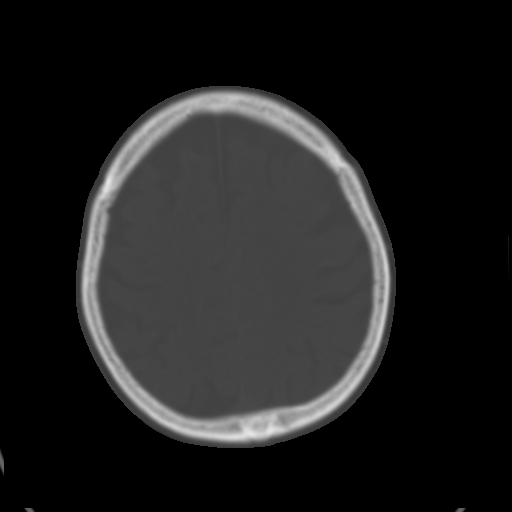
[im 27/32  brain]
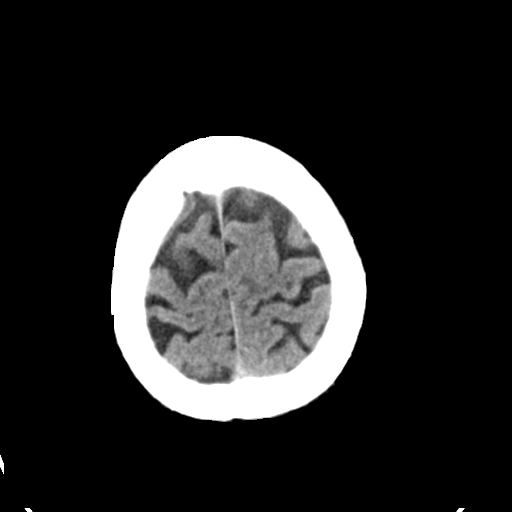

[Series 4: head bone · axial · 0.43mm/px · z∈[-78,-62]mm · 2 of 82 slices shown]
[im 8/82  bone]
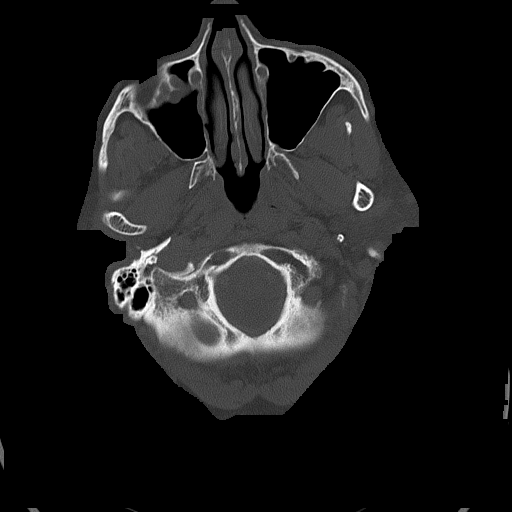
[im 16/82  bone]
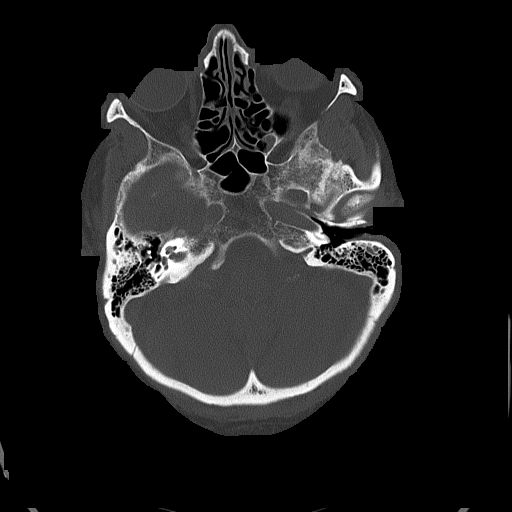

[Series 5: head without cor · coronal · non-contrast · 0.32mm/px · 3 of 67 slices shown]
[im 23/67  brain]
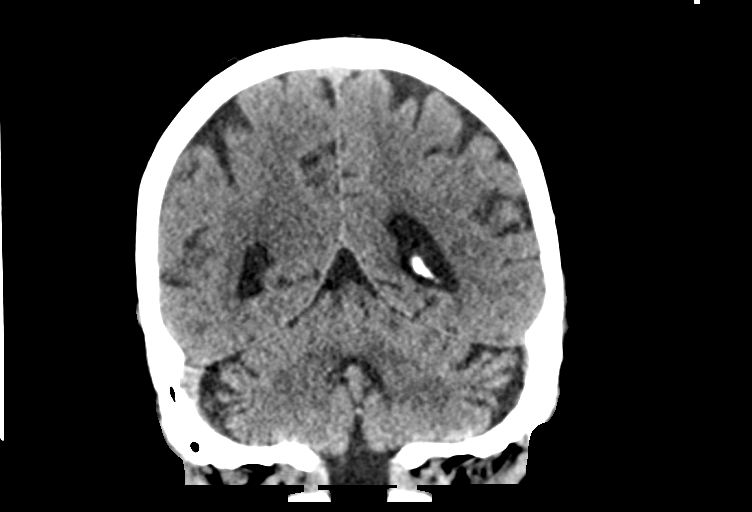
[im 30/67  brain]
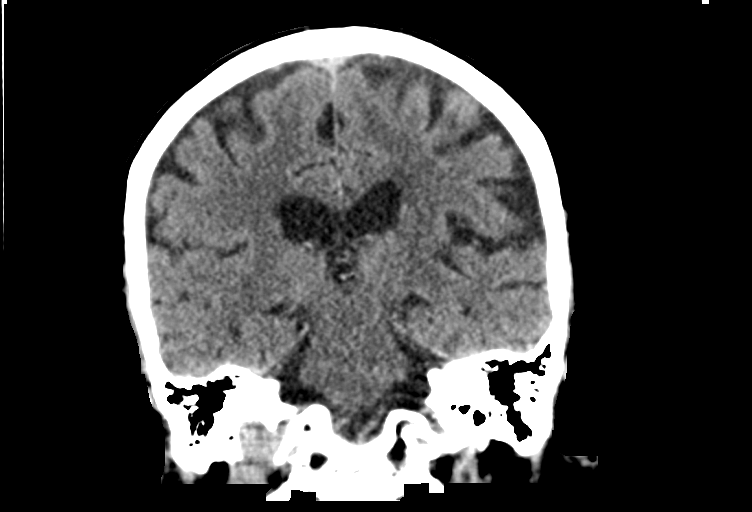
[im 37/67  brain]
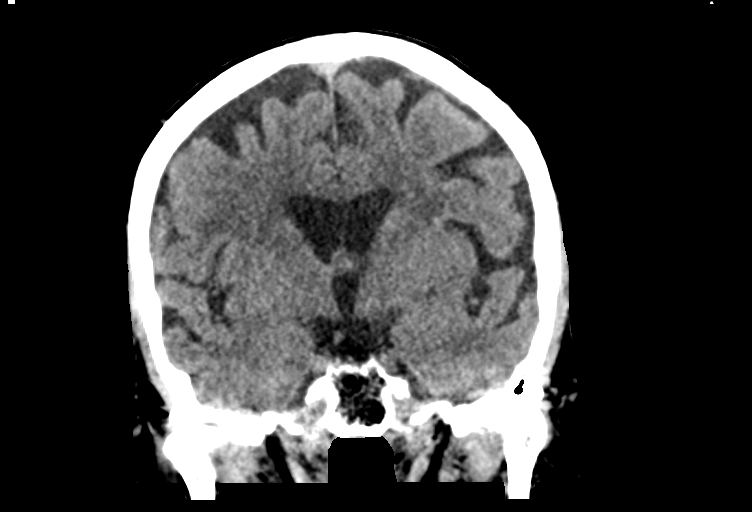

[Series 6: head without sag · sagittal · non-contrast · 0.32mm/px · 3 of 64 slices shown]
[im 22/64  brain]
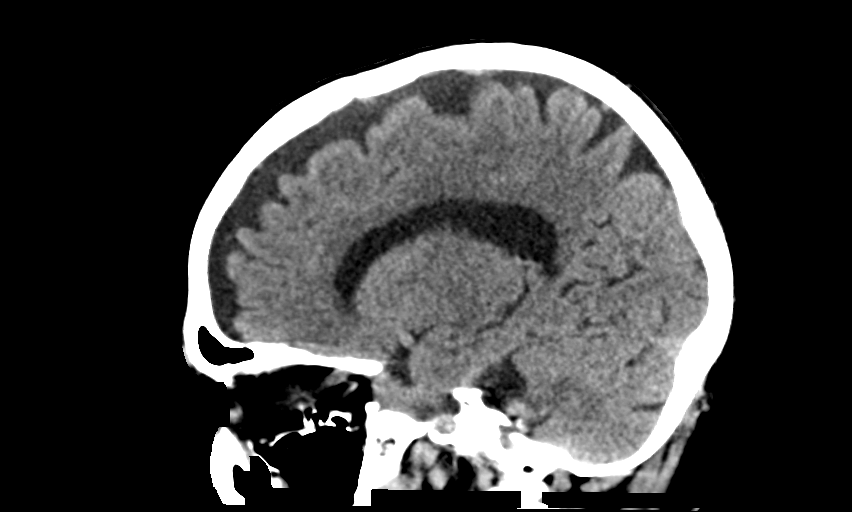
[im 32/64  brain]
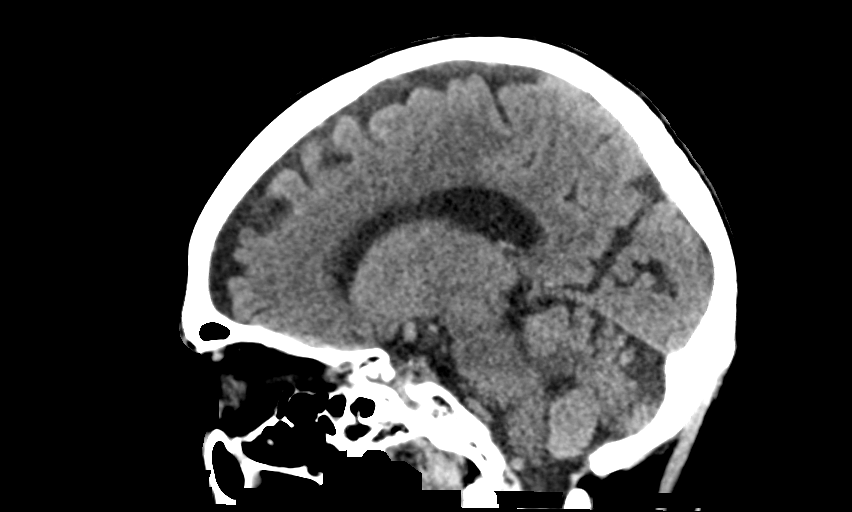
[im 43/64  brain]
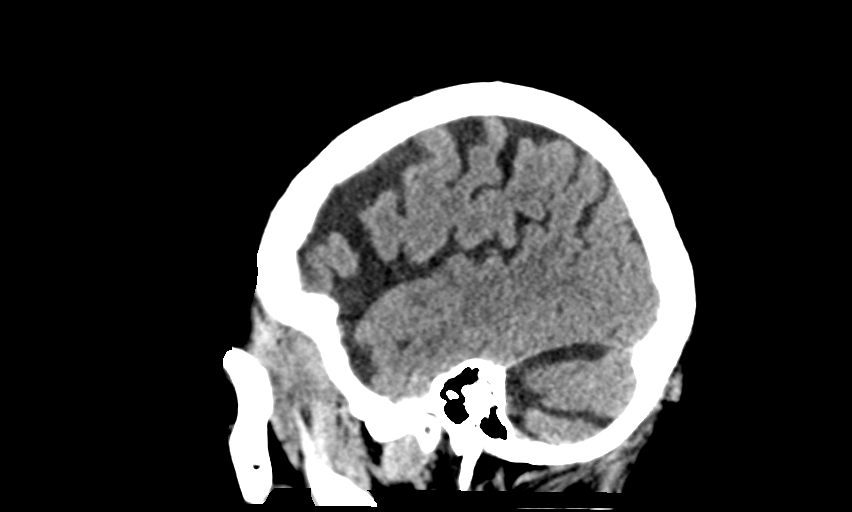

[14 of 47 positions shown; findings below may reference images not displayed]

FINDINGS: Brain: No evidence of acute large vascular territory infarct. No
acute hemorrhage. No mass lesion or abnormal mass effect. No
hydrocephalus. Mild-to-moderate patchy white matter hypoattenuation,
likely related to chronic microvascular ischemic disease. Mild to
moderate generalized cerebral volume loss.

Vascular: Calcific atherosclerosis. No hyperdense vessel identified.

Skull: No acute fracture.

Sinuses/Orbits: Mild paranasal sinus mucosal thickening.
Unremarkable orbits.

Other: No mastoid effusions.
IMPRESSION: 1. No evidence of acute intracranial abnormality.
2. Mild to moderate chronic microvascular ischemic disease and
generalized cerebral volume loss.

## 2020-04-27 IMAGING — MR MR MRA NECK W/O CM
1 series · 18 of 48 positions shown · non-contrast
Comparison: CT head without contrast [DATE]

CLINICAL DATA: TIA. Episode of slurred speech and facial droop.
Left hand weakness. Symptoms lasted 20 minutes.



[Series 7: TOF · axial · 2.3mm · 0.47mm/px · z∈[-297,-123]mm · 18 of 164 slices shown]
[im 1/164]
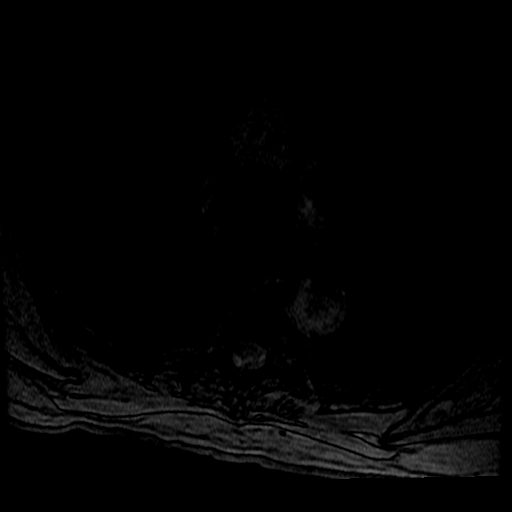
[im 4/164]
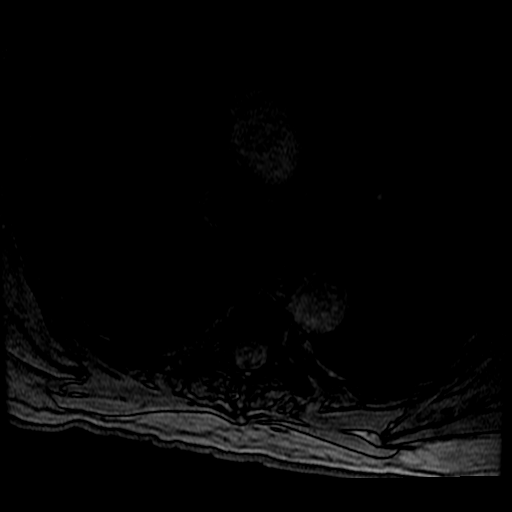
[im 7/164]
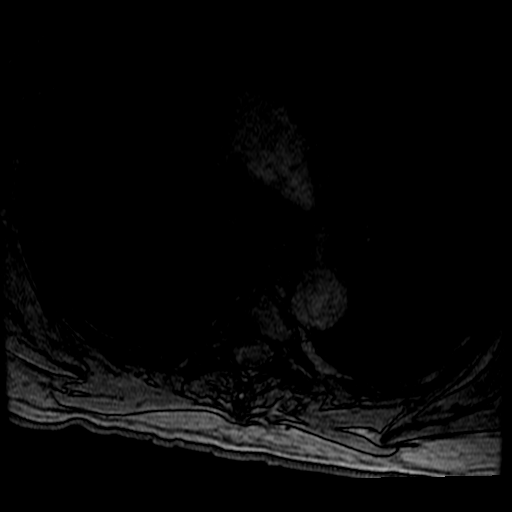
[im 11/164]
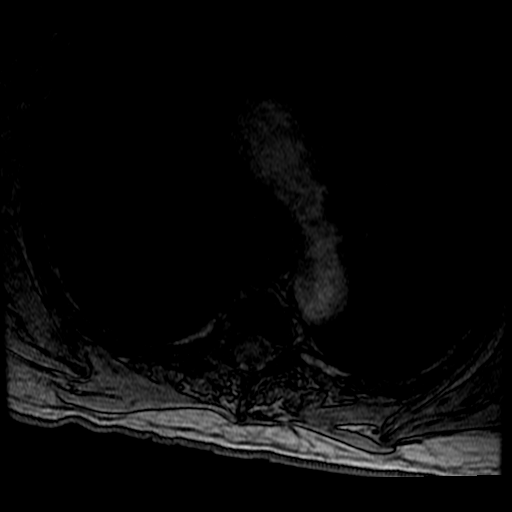
[im 14/164]
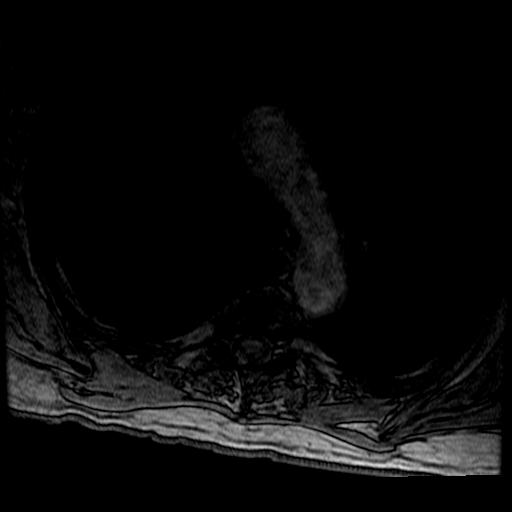
[im 18/164]
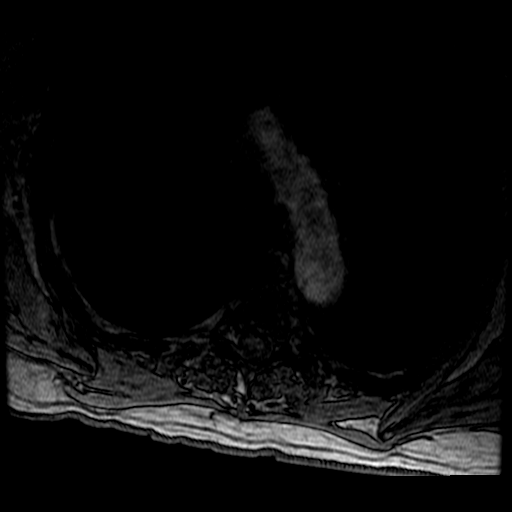
[im 21/164]
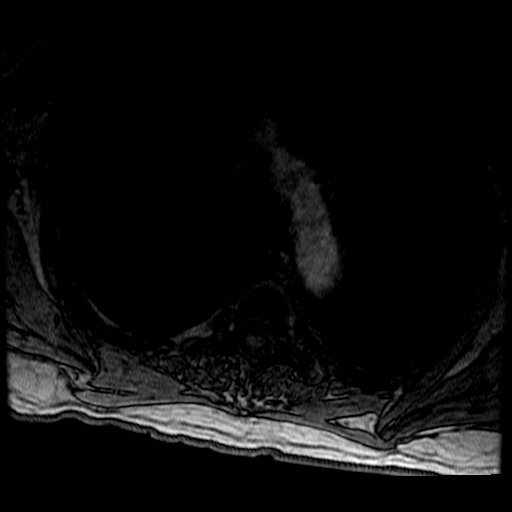
[im 25/164]
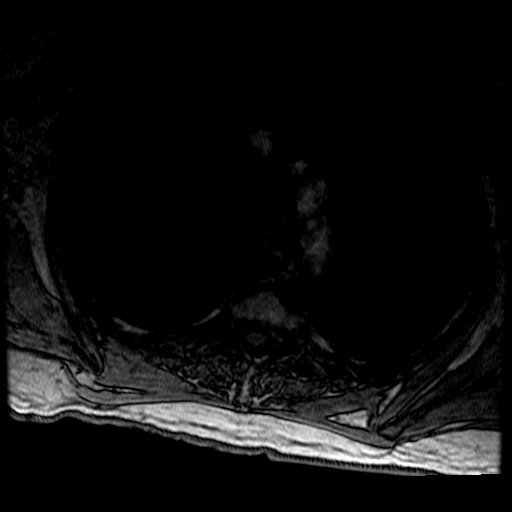
[im 28/164]
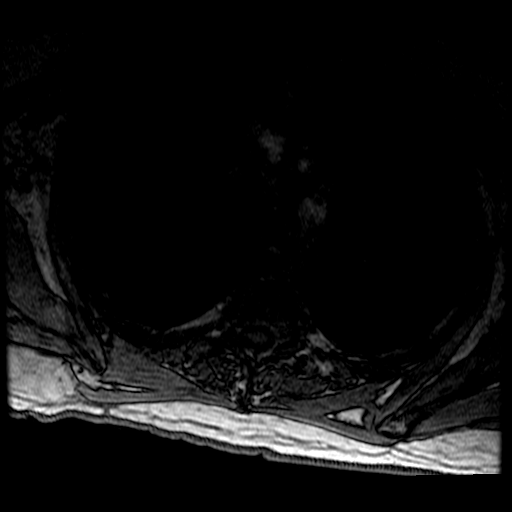
[im 32/164]
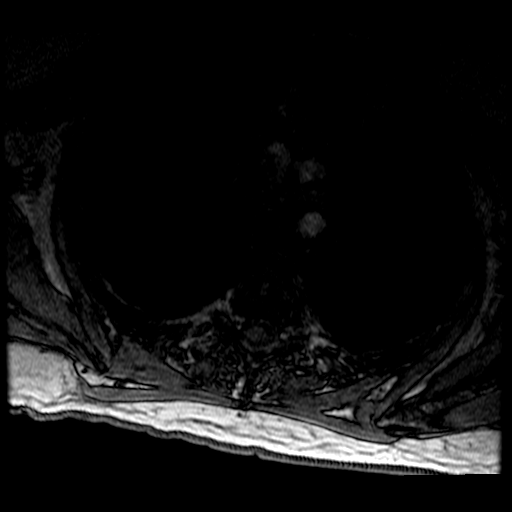
[im 53/164]
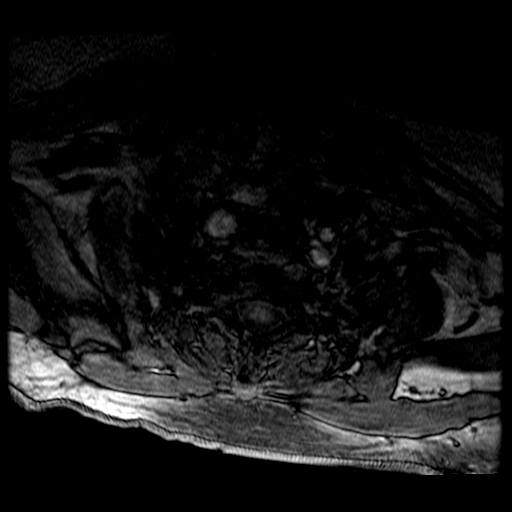
[im 73/164]
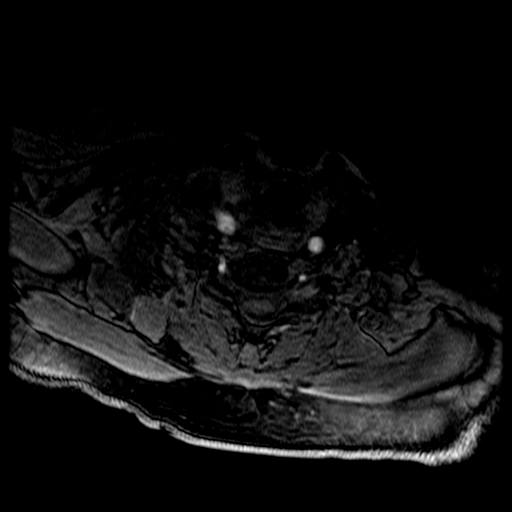
[im 84/164]
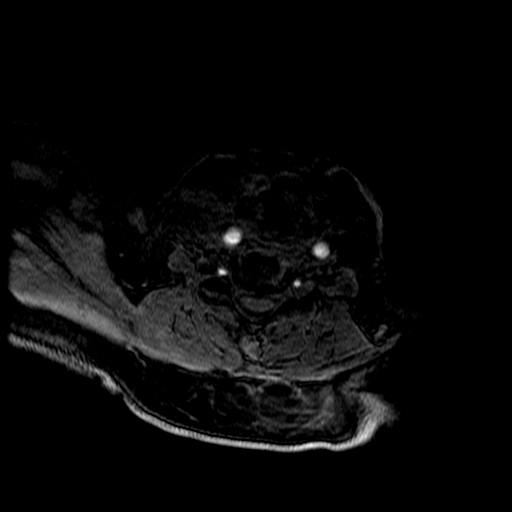
[im 94/164]
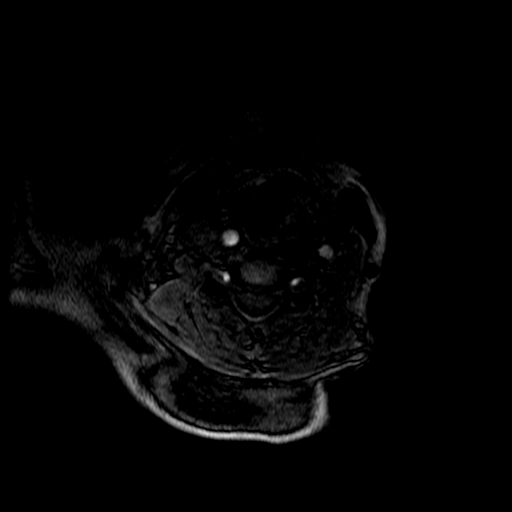
[im 115/164]
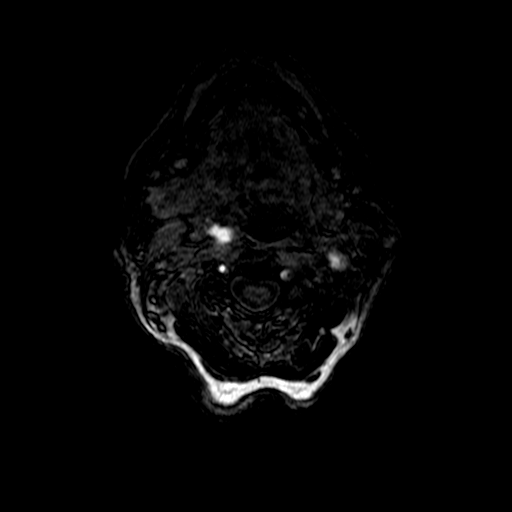
[im 136/164]
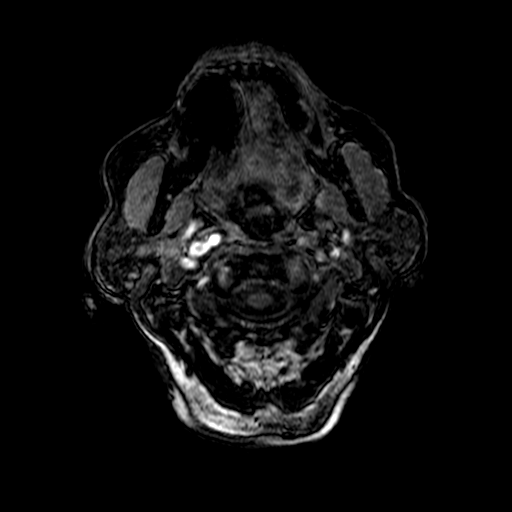
[im 139/164]
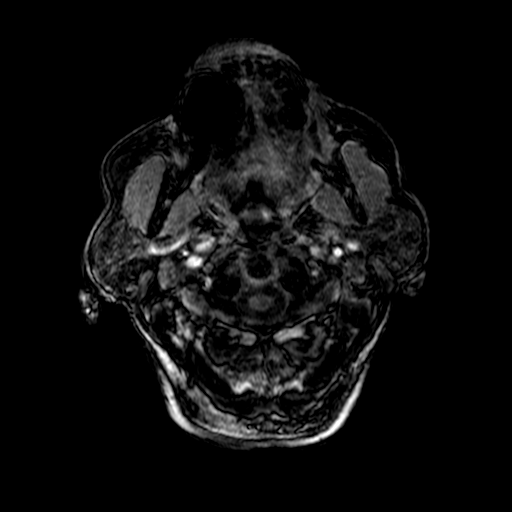
[im 157/164]
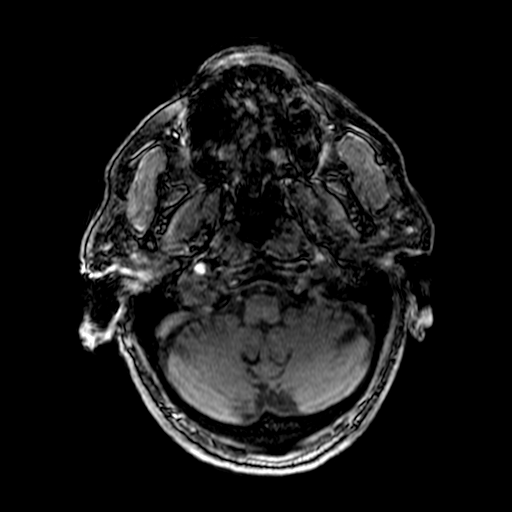

[18 of 48 positions shown; findings below may reference images not displayed]

FINDINGS: MRI HEAD FINDINGS

Brain: Moderate generalized atrophy and white matter disease is
present bilaterally. Asymmetric subcortical white matter changes are
present in the right frontal operculum and corona radiata. The
ventricles are proportionate to the degree of atrophy. No
significant extraaxial fluid collection is present.

Basal ganglia are within normal limits. The brainstem and cerebellum
are within normal limits. The internal auditory canals are within
normal limits.

Vascular: Flow is present in the major intracranial arteries.

Skull and upper cervical spine: The craniocervical junction is
normal. Upper cervical spine is within normal limits. Marrow signal
is unremarkable.

Sinuses/Orbits: The paranasal sinuses and mastoid air cells are
clear. The globes and orbits are within normal limits.

MRA HEAD FINDINGS

The internal carotid arteries are within normal limits from the high
cervical segments through the ICA termini bilaterally. The A1 and M1
segments are normal. In a 1.5 mm right anterior communicating artery
aneurysm is noted. MCA bifurcations are within normal limits.
Moderate 10 UA shin of distal MCA branch vessels is present
bilaterally without a significant proximal stenosis or occlusion.

Vertebral arteries are codominant. Right PICA origin visualized and
normal. Left AICA is dominant. Mild narrowing is present in the
distal vertebral artery at the vertebrobasilar junction. The basilar
artery is normal. Left posterior cerebral artery originates from
basilar tip. The right posterior cerebral artery is of fetal type.
Attenuation PCA branch vessels is noted bilaterally

MRA NECK FINDINGS

Time-of-flight images demonstrate a 3 vessel arch configuration.
Significant signal loss is noted at the proximal left internal
carotid artery with decreased more distal signal. There is
tortuosity of the internal carotid arteries bilaterally. Flow is
antegrade in the vertebral arteries bilaterally.
IMPRESSION: 1. No acute intracranial abnormality.
2. Moderate generalized atrophy and white matter disease likely
reflects the sequela of chronic microvascular ischemia.
3. No significant proximal stenosis, aneurysm, or branch vessel
occlusion within the Circle of Willis.
4. 1.5 mm right anterior communicating artery aneurysm.
5. Moderate distal small vessel disease in the anterior and
posterior circulation without other significant proximal stenosis,
aneurysm, or branch vessel occlusion.
6. Moderate to high-grade stenosis of the proximal left internal
carotid artery suggest on the time-of-flight images. This could be
confirmed with carotid Doppler study or CTA. It is contralateral to
the symptomatic side.

## 2020-04-27 IMAGING — MR MR HEAD W/O CM
9 of 11 series · 31 of 48 positions shown · non-contrast
Comparison: CT head without contrast [DATE]

CLINICAL DATA: TIA. Episode of slurred speech and facial droop.
Left hand weakness. Symptoms lasted 20 minutes.



[Series 3: DWI · axial · 3.0mm · 0.94mm/px · z∈[-124,+25]mm · 8 of 104 slices shown (1 of 2)]
[im 1/104]
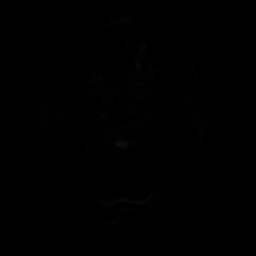
[im 15/104]
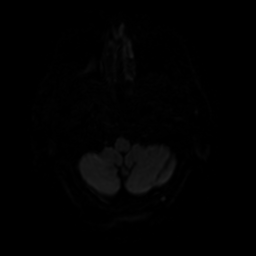
[im 30/104]
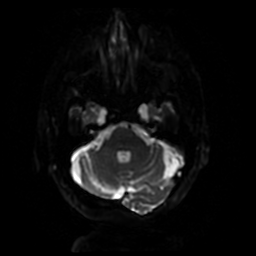
[im 45/104]
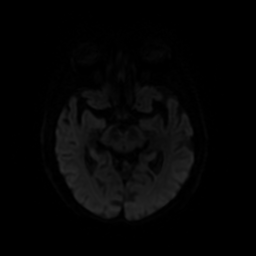
[im 59/104]
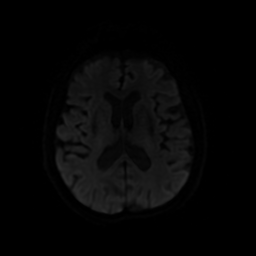
[im 74/104]
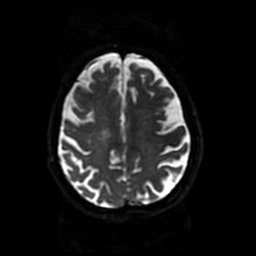
[im 89/104]
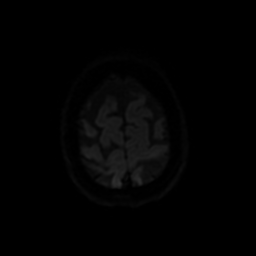
[im 104/104]
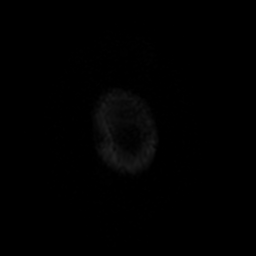

[Series 4: DWI · coronal · 4.0mm · 0.94mm/px · 5 of 70 slices shown (2 of 2)]
[im 1/70]
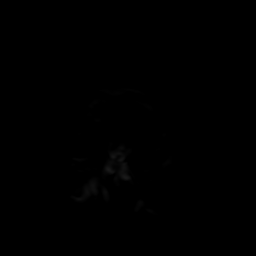
[im 18/70]
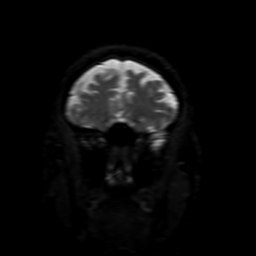
[im 35/70]
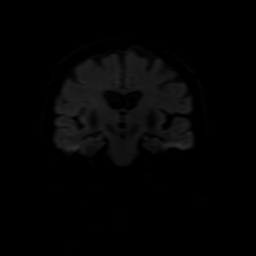
[im 52/70]
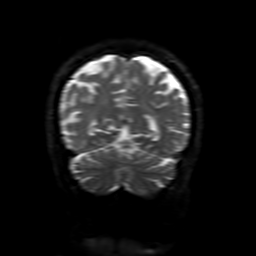
[im 70/70]
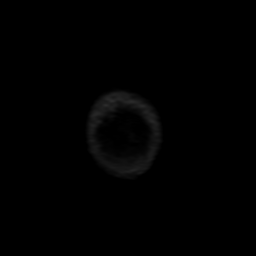

[Series 6: FLAIR · axial · 3.0mm · 0.45mm/px · z∈[-121,+25]mm · 2 of 26 slices shown (1 of 2)]
[im 1/26]
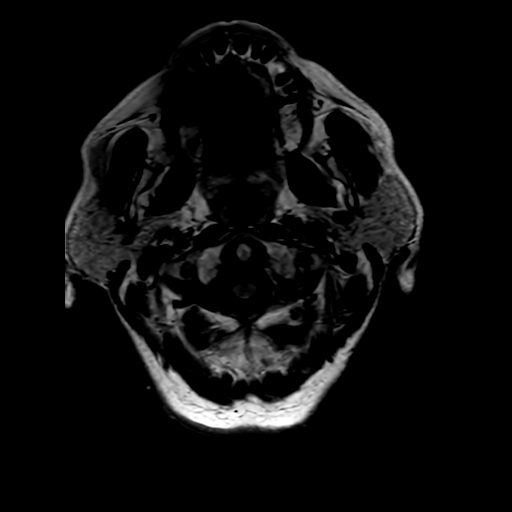
[im 26/26]
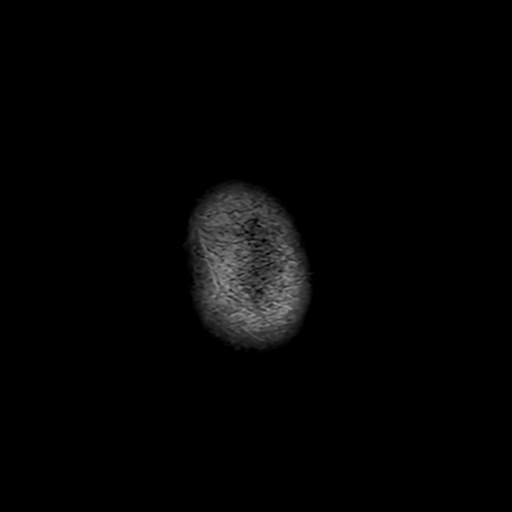

[Series 8: (person_name) · axial · 3.0mm · 0.47mm/px · z∈[-124,-82]mm · 3 of 104 slices shown]
[im 1/104]
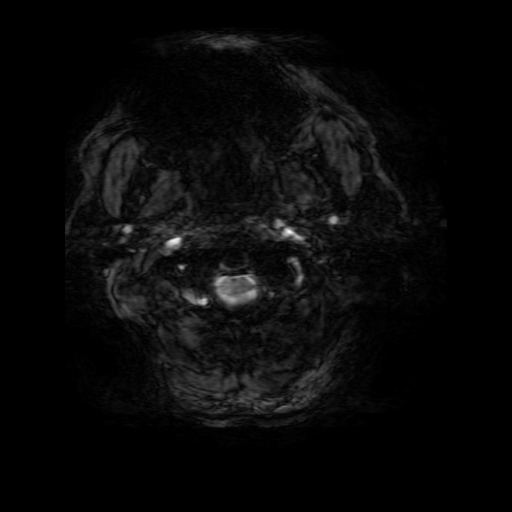
[im 15/104]
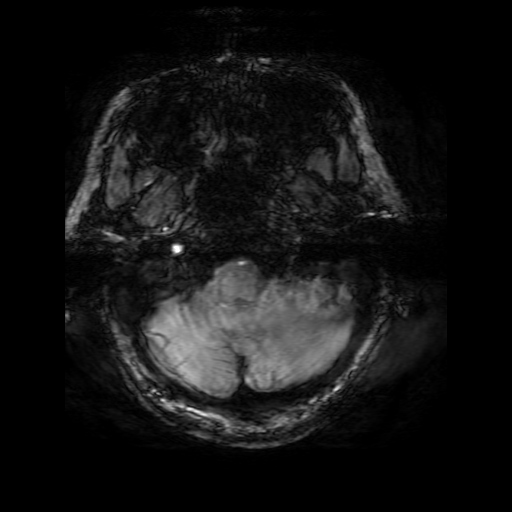
[im 30/104]
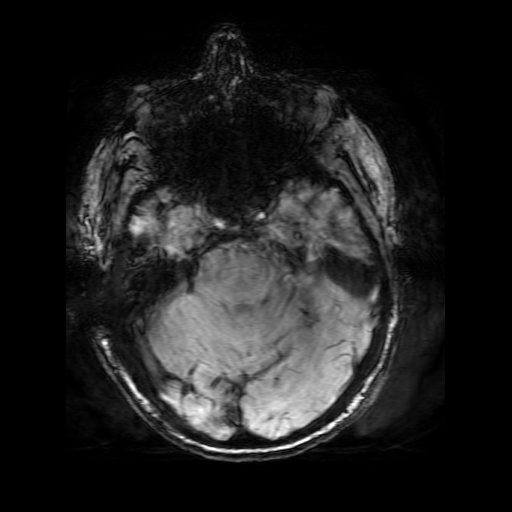

[Series 9: FLAIR · sagittal · 5.0mm · 0.47mm/px · 2 of 25 slices shown (2 of 2)]
[im 1/25]
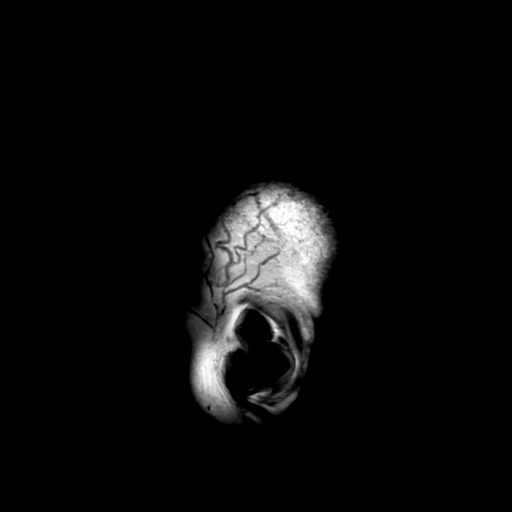
[im 25/25]
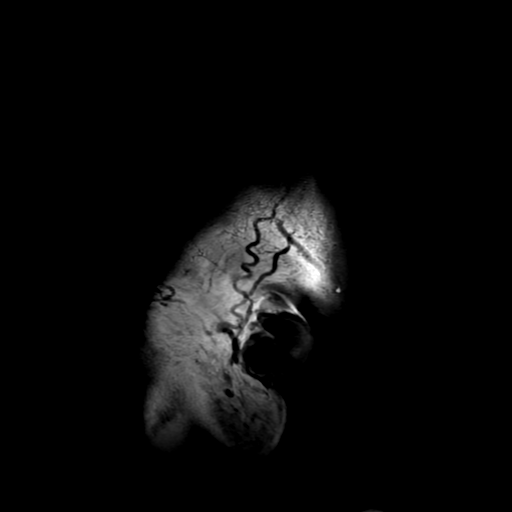

[Series 10: T2 · axial · 5.0mm · 0.47mm/px · z∈[-122,+24]mm · 2 of 26 slices shown (1 of 2)]
[im 1/26]
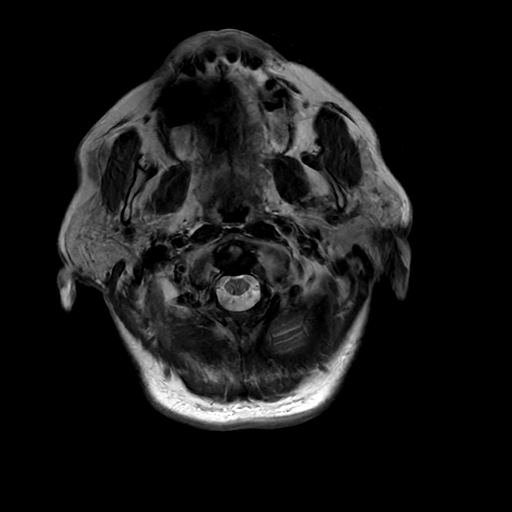
[im 26/26]
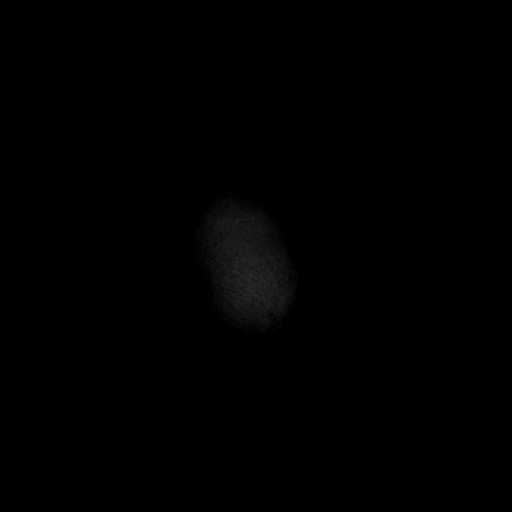

[Series 12: T2 · coronal · 5.0mm · 0.39mm/px · 2 of 29 slices shown (2 of 2)]
[im 1/29]
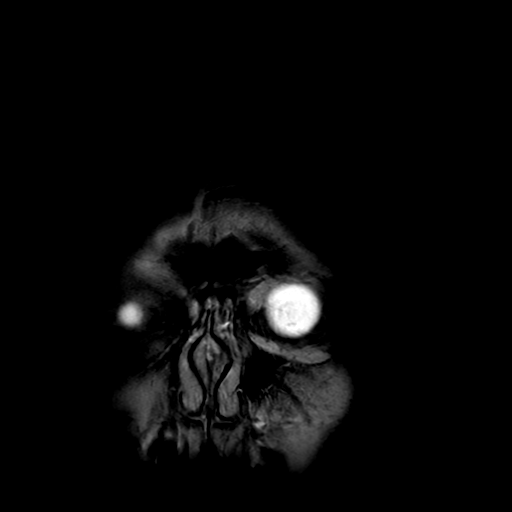
[im 29/29]
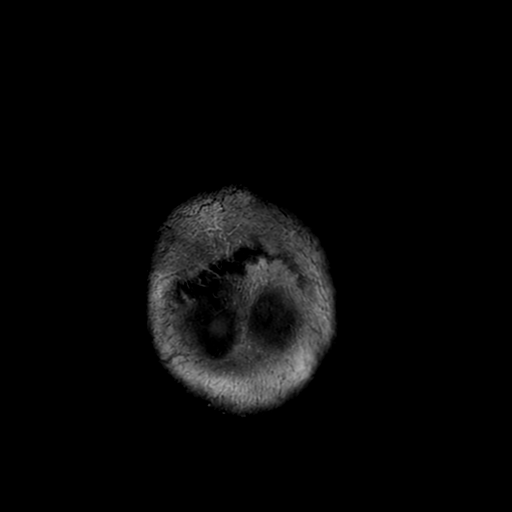

[Series 350: ADC · axial · 3.0mm · 0.94mm/px · z∈[-124,+25]mm · 4 of 52 slices shown (1 of 2)]
[im 1/52]
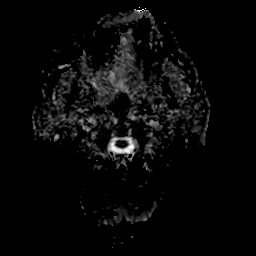
[im 18/52]
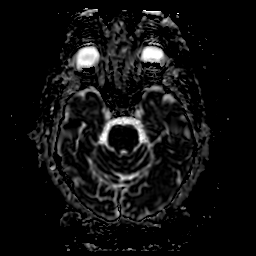
[im 35/52]
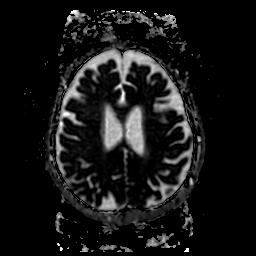
[im 52/52]
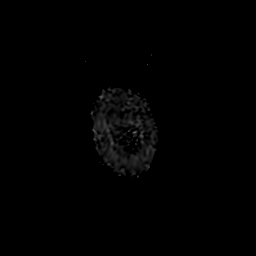

[Series 450: ADC · coronal · 4.0mm · 0.94mm/px · 3 of 35 slices shown (2 of 2)]
[im 1/35]
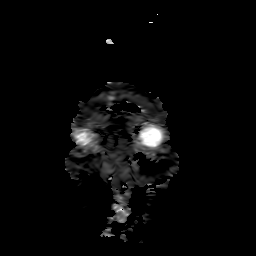
[im 18/35]
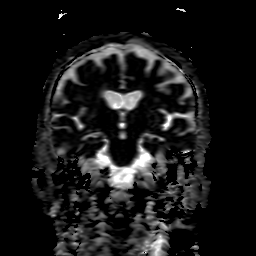
[im 35/35]
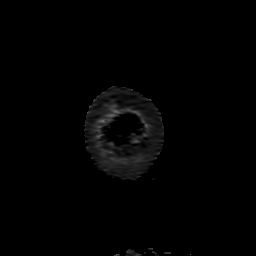

[31 of 48 positions shown; findings below may reference images not displayed]

FINDINGS: MRI HEAD FINDINGS

Brain: Moderate generalized atrophy and white matter disease is
present bilaterally. Asymmetric subcortical white matter changes are
present in the right frontal operculum and corona radiata. The
ventricles are proportionate to the degree of atrophy. No
significant extraaxial fluid collection is present.

Basal ganglia are within normal limits. The brainstem and cerebellum
are within normal limits. The internal auditory canals are within
normal limits.

Vascular: Flow is present in the major intracranial arteries.

Skull and upper cervical spine: The craniocervical junction is
normal. Upper cervical spine is within normal limits. Marrow signal
is unremarkable.

Sinuses/Orbits: The paranasal sinuses and mastoid air cells are
clear. The globes and orbits are within normal limits.

MRA HEAD FINDINGS

The internal carotid arteries are within normal limits from the high
cervical segments through the ICA termini bilaterally. The A1 and M1
segments are normal. In a 1.5 mm right anterior communicating artery
aneurysm is noted. MCA bifurcations are within normal limits.
Moderate 10 UA shin of distal MCA branch vessels is present
bilaterally without a significant proximal stenosis or occlusion.

Vertebral arteries are codominant. Right PICA origin visualized and
normal. Left AICA is dominant. Mild narrowing is present in the
distal vertebral artery at the vertebrobasilar junction. The basilar
artery is normal. Left posterior cerebral artery originates from
basilar tip. The right posterior cerebral artery is of fetal type.
Attenuation PCA branch vessels is noted bilaterally

MRA NECK FINDINGS

Time-of-flight images demonstrate a 3 vessel arch configuration.
Significant signal loss is noted at the proximal left internal
carotid artery with decreased more distal signal. There is
tortuosity of the internal carotid arteries bilaterally. Flow is
antegrade in the vertebral arteries bilaterally.
IMPRESSION: 1. No acute intracranial abnormality.
2. Moderate generalized atrophy and white matter disease likely
reflects the sequela of chronic microvascular ischemia.
3. No significant proximal stenosis, aneurysm, or branch vessel
occlusion within the Circle of Willis.
4. 1.5 mm right anterior communicating artery aneurysm.
5. Moderate distal small vessel disease in the anterior and
posterior circulation without other significant proximal stenosis,
aneurysm, or branch vessel occlusion.
6. Moderate to high-grade stenosis of the proximal left internal
carotid artery suggest on the time-of-flight images. This could be
confirmed with carotid Doppler study or CTA. It is contralateral to
the symptomatic side.

## 2020-04-27 MED ORDER — ACETAMINOPHEN 325 MG PO TABS: 650.0000 mg | ORAL_TABLET | ORAL | Status: DC | PRN

## 2020-04-27 MED ORDER — RIVAROXABAN 20 MG PO TABS
20.0000 mg | ORAL_TABLET | Freq: Every day | ORAL | Status: DC
  Administered 2020-04-27: 20 mg via ORAL
  Filled 2020-04-27 (×2): qty 1

## 2020-04-27 MED ORDER — SODIUM CHLORIDE 0.9 % IV BOLUS
500.0000 mL | Freq: Once | INTRAVENOUS | Status: AC
  Administered 2020-04-27: 500 mL via INTRAVENOUS

## 2020-04-27 MED ORDER — SODIUM CHLORIDE 0.9 % IV SOLN: INTRAVENOUS | Status: DC

## 2020-04-27 MED ORDER — ACETAMINOPHEN 160 MG/5ML PO SOLN: 650.0000 mg | ORAL | Status: DC | PRN

## 2020-04-27 MED ORDER — ACETAMINOPHEN 650 MG RE SUPP
650.0000 mg | RECTAL | Status: DC | PRN
Start: 2020-04-27 — End: 2020-05-02

## 2020-04-27 MED ORDER — STROKE: EARLY STAGES OF RECOVERY BOOK
Freq: Once | Status: DC
  Filled 2020-04-27: qty 1

## 2020-04-27 MED ORDER — SENNOSIDES-DOCUSATE SODIUM 8.6-50 MG PO TABS: 1.0000 | ORAL_TABLET | Freq: Every evening | ORAL | Status: DC | PRN

## 2020-04-27 NOTE — ED Provider Notes (Signed)
Patient placed in Quick Look pathway, seen and evaluated   Chief Complaint: Slurred speech, facial droop  HPI:   87 year old female arrives via EMS with slurred speech, left sided facial droop,  and lack of coordination, these symptoms lasted 5-10 minutes and then resolved by the time EMS had arrived.  No recurrence of symptoms while in transit.  No prior history of stroke.  ROS: + Slurred speech, facial droop, ataxia  Physical Exam:   Gen: No distress  Neuro: Awake and Alert  Skin: Warm    Focused Exam:  Noted to be bradycardic with heart rate in the 40s, sinus rhythm     Speech is clear, able to follow commands, CN III-XII intact     Normal strength in upper and lower extremities bilaterally including dorsiflexion and plantar flexion, strong and equal grip     strength     Sensation normal to light and sharp touch     Moves extremities without ataxia, coordination intact     No pronator drift    Initiation of care has begun. The patient has been counseled on the process, plan, and necessity for staying for the completion/evaluation, and the remainder of the medical screening examination  MSE was initiated and I personally evaluated the patient and placed orders (if any) at  1:57 PM on April 27, 2020.  The patient appears stable so that the remainder of the MSE may be completed by another provider.   Jacqlyn Larsen, PA-C 04/27/20 1406    Arnaldo Natal, MD 04/27/20 [phone]

## 2020-04-27 NOTE — ED Provider Notes (Signed)
[facility] EMERGENCY DEPARTMENT Provider Note   CSN: [id number] Arrival date & time: 04/27/20  1350     History Chief Complaint  Patient presents with  . TIA symptoms    Amber Kennedy is a 87 y.o. female with a hx of paroxysmal afib on xarelto, hypertension, & hypercholesterolemia who presents to the ED via EMS for evaluation of stroke like sxs that have resolved @ this time. Patient states he had quick onset slurred speech, left sided facial droop, and trouble with coordination, the individual she was with was concerned she was having a stroke and called EMS. The episode lasted 10-15 minutes prior to resolution and has not re-occurred, she has some tingling to the left fingers earlier today as well which is resolved. Asymptomatic at present. No hx of same. No specific alleviating/aggravating factors. Denies visual disturbance, headache, weakness, dizziness, syncope, vomiting, chest pain, dyspnea, or palpitations.   HPI     Past Medical History:  Diagnosis Date  . A-fib ([place])   . Gout   . Hypercholesterolemia   . Hypertension   . Hypokalemia   . Hyponatremia   . Vision loss     Patient Active Problem List   Diagnosis Date Noted  . Leg edema 09/20/2018  . Paroxysmal atrial fibrillation ([place]) 09/20/2018  . Atrial fibrillation with RVR ([place]) 02/05/2017  . Essential hypertension 02/05/2017  . Hyponatremia 02/05/2017  . Hypokalemia 02/05/2017    Past Surgical History:  Procedure Laterality Date  . CESAREAN SECTION  1972     OB History   No obstetric history on file.     Family History  Problem Relation Age of Onset  . Sudden death Father   . Sudden Cardiac Death Neg Hx     Social History   Tobacco Use  . Smoking status: Former Smoker    Packs/day: 0.50    Years: 35.00    Pack years: 17.50    Types: Cigarettes    Quit date: 2004    Years since quitting: 18.2  . Smokeless tobacco: Never Used  Vaping Use  . Vaping Use: Never used   Substance Use Topics  . Alcohol use: No  . Drug use: No    Home Medications Prior to Admission medications   Medication Sig Start Date End Date Taking? Authorizing Provider  allopurinol (ZYLOPRIM) 300 MG tablet Take 150 mg by mouth daily.     [provider]  Ascorbic Acid (VITAMIN C) 1000 MG tablet Take 1,000 mg by mouth daily.    [provider]  Cholecalciferol (VITAMIN D3) 1.25 MG (50000 UT) CAPS Take by mouth daily.    [provider]  furosemide (LASIX) 20 MG tablet Take 1 tablet (20 mg total) by mouth as needed. 09/20/19 12/19/19  Patwardhan, Reynold Bowen, MD  metoprolol succinate (TOPROL-XL) 100 MG 24 hr tablet Take 1 tablet (100 mg total) by mouth daily. 03/24/20   Patwardhan, Reynold Bowen, MD  Multiple Vitamins-Minerals (MULTIVITAMIN WOMEN PO) Take by mouth daily.    [provider]  NON FORMULARY FOCUS FACTOR, TAKE OCCASIONAL    [provider]  rivaroxaban (XARELTO) 20 MG TABS tablet Take 1 tablet (20 mg total) by mouth daily with supper. 03/03/20   Patwardhan, Reynold Bowen, MD    Allergies    Latex and Penicillin g  Review of Systems   Review of Systems  Constitutional: Negative for fever.  Eyes: Negative for visual disturbance.  Respiratory: Negative for shortness of breath.   Cardiovascular:  Negative for chest pain.  Gastrointestinal: Negative for abdominal pain and vomiting.  Neurological: Positive for facial asymmetry (resolved), speech difficulty (resolved) and numbness (finger paresthesia, resolved). Negative for dizziness, syncope, weakness and light-headedness.  All other systems reviewed and are negative.   Physical Exam Updated Vital Signs BP (!) 159/71 (BP Location: Right Arm)   Pulse (!) 43   Temp 98.7 F (37.1 C) (Oral)   Resp 16   SpO2 99%   Physical Exam Vitals and nursing note reviewed.  Constitutional:      General: She is not in acute distress.    Appearance: She is well-developed. She is not toxic-appearing.   HENT:     Head: Normocephalic and atraumatic.  Eyes:     General: Vision grossly intact. Gaze aligned appropriately.        Right eye: No discharge.        Left eye: No discharge.     Extraocular Movements: Extraocular movements intact.     Conjunctiva/sclera: Conjunctivae normal.     Comments: PERRL. No proptosis.   Cardiovascular:     Rate and Rhythm: Regular rhythm. Bradycardia present.  Pulmonary:     Effort: Pulmonary effort is normal. No respiratory distress.     Breath sounds: Normal breath sounds. No wheezing, rhonchi or rales.  Abdominal:     General: There is no distension.     Palpations: Abdomen is soft.     Tenderness: There is no abdominal tenderness.  Musculoskeletal:     Cervical back: Normal range of motion and neck supple. No rigidity.  Skin:    General: Skin is warm and dry.     Findings: No rash.  Neurological:     Comments: Alert. Clear speech. No facial droop. CNIII-XII grossly intact. Bilateral upper and lower extremities' sensation grossly intact. 5/5 symmetric strength with grip strength and with plantar and dorsi flexion bilaterally . Normal finger to nose bilaterally. Negative pronator drift.   Psychiatric:        Behavior: Behavior normal.     ED Results / Procedures / Treatments   Labs (all labs ordered are listed, but only abnormal results are displayed) Labs Reviewed  PROTIME-INR - Abnormal; Notable for the following components:      Result Value   Prothrombin Time 16.5 (*)    INR 1.4 (*)    All other components within normal limits  COMPREHENSIVE METABOLIC PANEL - Abnormal; Notable for the following components:   Glucose, Bld 120 (*)    Creatinine, Ser 1.23 (*)    GFR, Estimated 43 (*)    All other components within normal limits  RESP PANEL BY RT-PCR (FLU A&B, COVID) ARPGX2  ETHANOL  APTT  CBC  DIFFERENTIAL  RAPID URINE DRUG SCREEN, HOSP PERFORMED  URINALYSIS, ROUTINE W REFLEX MICROSCOPIC    EKG None  Radiology CT HEAD WO  CONTRAST  Result Date: 04/27/2020 CLINICAL DATA:  Transient ischemic attack. Slurred speech and left-sided facial droop. EXAM: CT HEAD WITHOUT CONTRAST TECHNIQUE: Contiguous axial images were obtained from the base of the skull through the vertex without intravenous contrast. COMPARISON:  None. FINDINGS: Brain: No evidence of acute large vascular territory infarct. No acute hemorrhage. No mass lesion or abnormal mass effect. No hydrocephalus. Mild-to-moderate patchy white matter hypoattenuation, likely related to chronic microvascular ischemic disease. Mild to moderate generalized cerebral volume loss. Vascular: Calcific atherosclerosis. No hyperdense vessel identified. Skull: No acute fracture. Sinuses/Orbits: Mild paranasal sinus mucosal thickening. Unremarkable orbits. Other: No mastoid effusions. IMPRESSION: 1. No  evidence of acute intracranial abnormality. 2. Mild to moderate chronic microvascular ischemic disease and generalized cerebral volume loss. Electronically Signed   By: Margaretha Sheffield MD   On: 04/27/2020 15:44    Procedures Procedures   Medications Ordered in ED Medications - No data to display  ED Course  I have reviewed the triage vital signs and the nursing notes.  Pertinent labs & imaging results that were available during my care of the patient were reviewed by me and considered in my medical decision making (see chart for details).    MDM Rules/Calculators/A&P                         Patient presents to the ED with complaints of an episode of slurred speech, facial droop, and trouble with coordination that has resolved. Nontoxic, bradycardic, mildly hypertensive as well. Asymptomatic currently. No focal neuro deficits on exam.  Additional history obtained:  Additional history obtained from chart review & nursing note review.   EKG: Sinus bradycardia. Lab Tests:  I reviewed and interpreted labs, which included:  CBC, CMP, APTT, PT/INR, ethanol level, COVID/influenza  testing: Fairly unremarkable, creatinine is mildly increased, PT/INR are mildly elevated.  Imaging Studies ordered:  CT head obtained, I independently reviewed, formal radiology impression shows:  1. No evidence of acute intracranial abnormality. 2. Mild to moderate chronic microvascular ischemic disease and generalized cerebral volume loss.  ED Course:  Patient presentation concerning for TIA.  Remains asymptomatic without neurologic deficits at this time.  Plan discussed with neurology. She also is bradycardic in the ED, asymptomatic in this regard, did have recent increase in metoprolol dosing which may be contributory, will continue to monitor.   16:27: CONSULT: Discussed with neurologist Dr. Lorrin Goodell- admit to hospitalist service.   16:56: CONSULT: Discussed with Dr [facility] Fuse [facility]- accepts admission.   Findings and plan of care discussed with supervising physician Dr. Vanita Panda who is in agreement.   Portions of this note were generated with [employer]. Dictation errors may occur despite best attempts at proofreading.  Final Clinical Impression(s) / ED Diagnoses Final diagnoses:  TIA (transient ischemic attack)    Rx / DC Orders ED Discharge Orders    None       Amaryllis Dyke, PA-C 04/27/20 1721    Carmin Muskrat, MD 04/30/20 1040

## 2020-04-27 NOTE — Consult Note (Signed)
Neurology Consultation  Reason for Consult: TIA Referring Physician: S. Petrucelli, PA  CC: TIA  History is obtained from: patient  HPI: Amber Kennedy is a 87 y.o. female with a PMHx of HTN, AF, gout, vision loss (she is not blind), and HLD. She presented to [facility] ED today after having about a 20-30 minute episode today of slurred speech (described as tongue thickness), left hand weakness, and facial droop noted by her granddaughter. There was no noted seizure like activity with today's event.   Her family was coming over today for lunch and she was getting ready for that. Upon arrival of her family, she began to notice her speech did not sound right. Then her granddaughter told her she noticed her face drooping. Per the patient, her hand felt weak. This lasted about 20-30 mins and she came by EMS. Her symptoms resolved before EMS arrived. While waiting to be seen, she had recurrence of mild left hand weakness and told triage RN. She was brought back to room at that point. Because of the symptoms and the fact they lasted 20-30 minutes, TIA was suspected.   Patient states that for 2 weeks she has had finger numbness on her left hand. Only in her fingers, not involving whole hand or wrist. No other stroke like symptoms noted for past 2 weeks, other than today.   Patient lives alone and is independent for all ADLs, housework, cooking, and finances.   She sees a cardiologist for AF and is on Xarelto. No ASA. She stopped her Allopurinol recently and has had no gout flares. She also quit taking a HTN med due to leg swelling and when asked if it was Norvasc, she thinks so.   In review of chart, patient saw cardiology for f/up on 03/24/20. Her LDL was 83. HbA1c was 5.6% and BP was 173/72. CHADVAS 4 with annual stroke rik of 4%.   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 Denies brain injury,  brain surgery, or brain bleeding. Has never had a seizure.   ROS: A 14 point ROS was performed and is negative except as noted in the HPI.   Past Medical History:  Diagnosis Date  . A-fib ([place])   . Gout   . Hypercholesterolemia   . Hypertension   . Hypokalemia   . Hyponatremia   . Vision loss     Family History  Problem Relation Age of Onset  . Sudden death Father   . Sudden Cardiac Death Neg Hx     Social History:   reports that she quit smoking about 18 years ago. Her smoking use included cigarettes. She has a 17.50 pack-year smoking history. She has never used smokeless tobacco. She reports that she does not drink alcohol and does not use drugs.  Medications  Current Facility-Administered Medications:  .   stroke: mapping our early stages of recovery book, , Does not apply, Once, MacNeil, Richard G, DO .  0.9 %  sodium chloride infusion, , Intravenous, Continuous, MacNeil, Richard G, DO .  acetaminophen (TYLENOL) tablet 650 mg, 650 mg, Oral, Q4H PRN **OR** acetaminophen (TYLENOL) 160 MG/5ML solution 650 mg, 650 mg, Per Tube, Q4H PRN **OR** acetaminophen (TYLENOL) suppository 650 mg, 650 mg, Rectal, Q4H PRN, MacNeil, Richard G, DO .  senna-docusate (Senokot-S) tablet 1 tablet, 1 tablet, Oral, QHS PRN, MacNeil, Richard G, DO .  sodium chloride 0.9 % bolus 500 mL, 500 mL, Intravenous, Once, Petrucelli, Samantha R, PA-C  Current Outpatient Medications:  .  metoprolol succinate (TOPROL-XL) 100 MG 24 hr tablet, Take 1 tablet (100 mg total) by mouth daily., Disp: 90 tablet, Rfl: 2 .  Multiple Vitamins-Calcium (ONE-A-DAY WOMENS FORMULA) TABS, Take 1 tablet by mouth daily with breakfast., Disp: , Rfl:  .  rivaroxaban (XARELTO) 20 MG TABS tablet, Take 1 tablet (20 mg total) by mouth daily with supper. (Patient taking differently: Take 20 mg by mouth daily with lunch.), Disp: 90 tablet, Rfl: 0 .  furosemide (LASIX) 20 MG tablet, Take 1 tablet (20 mg total) by mouth as needed. (Patient  not taking: Reported on 04/27/2020), Disp: 60 tablet, Rfl: 2   Exam: Current vital signs: BP (!) 158/75   Pulse (!) 49   Temp 98.7 F (37.1 C) (Oral)   Resp 15   SpO2 100%  Vital signs in last 24 hours: Temp:  [98.7 F (37.1 C)] 98.7 F (37.1 C) (04/03 1355) Pulse Rate:  [36-86] 49 (04/03 1700) Resp:  [14-16] 15 (04/03 1700) BP: (134-159)/(64-80) 158/75 (04/03 1700) SpO2:  [98 %-100 %] 100 % (04/03 1700)  GENERAL: Awake, alert in NAD HEENT: - Normocephalic and atraumatic, moist mucous membranes.  LUNGS - Normal respiratory effort. SaO2 CV - Regular rhythm, bradycardic.  ABDOMEN - Soft, nontender Ext: warm, well perfused Psych: Affect light.   NEURO:  Mental Status: AA&Ox3  Speech/Language: speech is without dysarthria or aphasia. Naming, repetition, fluency, and comprehension intact. Cranial Nerves:  II: PERRL 58mm/brisk. visual fields full.  III, IV, VI: EOMI. Lid elevation symmetric and full.  V: sensation is intact and symmetrical to face. Moves jaw back and forth.  VII: Smile is symmetrical. Able to puff cheeks and raise eyebrows.  VIII:hearing intact to voice IX, X: palate elevation is symmetric. Phonation normal.  XI: normal sternocleidomastoid and trapezius muscle strength EZM:OQHUTM is symmetrical without fasciculations.   Motor: 5/5 strength is all muscle groups.  Tone is normal. Bulk is normal.  Sensation- Intact to light touch bilaterally in all four extremities. Extinction absent to light touch to DSS.  Coordination: FTN intact bilaterally. HKS intact bilaterally. No pronator drift.  Gait- deferred  1a Level of Conscious: 0 1b LOC Questions: 0 1c LOC Commands: 0 2 Best Gaze: 0 3 Visual: 0 4 Facial Palsy: 0 5a Motor Arm - left: 0 5b Motor Arm - Right: 0 6a Motor Leg - Left: 0 6b Motor Leg - Right: 0 7 Limb Ataxia: 0 8 Sensory: 0 9 Best Language: 0 10 Dysarthria: 0 11 Extinct. and Inatten.: 0 TOTAL: 0   Labs I have reviewed labs in epic and the  results pertinent to this consultation are: LDL 83 and HbA1c 5.6 03/24/20.  04/27/20 INR 1.4, aPTT 35, glucose 120, Creat 1.23  CBC    Component Value Date/Time   WBC 9.2 04/27/2020 1408   RBC 4.78 04/27/2020 1408   HGB 14.6 04/27/2020 1408   HGB 12.4 08/01/2011 0940   HCT 44.8 04/27/2020 1408   HCT 36.3 08/01/2011 0940   PLT 272 04/27/2020 1408   PLT 236 08/01/2011 0940   MCV 93.7 04/27/2020 1408   MCV 89 08/01/2011 0940   MCH 30.5 04/27/2020 1408   MCHC 32.6 04/27/2020 1408   RDW 12.9 04/27/2020 1408   RDW 13.2 08/01/2011 0940   LYMPHSABS 2.1 04/27/2020 1408   MONOABS 0.5 04/27/2020 1408   EOSABS 0.1 04/27/2020 1408   BASOSABS 0.0  04/27/2020 1408    CMP     Component Value Date/Time   NA 138 04/27/2020 1408   NA 139 04/21/2017 0941   NA 132 (L) 08/01/2011 0940   K 4.7 04/27/2020 1408   K 3.2 (L) 08/01/2011 0940   CL 106 04/27/2020 1408   CL 96 (L) 08/01/2011 0940   CO2 23 04/27/2020 1408   CO2 31 08/01/2011 0940   GLUCOSE 120 (H) 04/27/2020 1408   GLUCOSE 112 (H) 08/01/2011 0940   BUN 20 04/27/2020 1408   BUN 14 04/21/2017 0941   BUN 13 08/01/2011 0940   CREATININE 1.23 (H) 04/27/2020 1408   CREATININE 0.95 08/01/2011 0940   CALCIUM 9.6 04/27/2020 1408   CALCIUM 9.2 08/01/2011 0940   PROT 7.0 04/27/2020 1408   ALBUMIN 3.8 04/27/2020 1408   AST 19 04/27/2020 1408   ALT 16 04/27/2020 1408   ALKPHOS 63 04/27/2020 1408   BILITOT 0.7 04/27/2020 1408   GFRNONAA 43 (L) 04/27/2020 1408   GFRNONAA 58 (L) 08/01/2011 0940   GFRAA 65 04/21/2017 0941   GFRAA >60 08/01/2011 0940   Imaging  CT head 1. No evidence of acute intracranial abnormality. 2. Mild to moderate chronic microvascular ischemic disease and generalized cerebral volume loss.  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 She presents today with stroke like symptoms x 20-30 minutes at home which resolved prior to arrival. However,  when in triage, her left hand felt tingly and a little weak. She has had recent labs as above. LDL is over 70, but reluctant to start this 87 yo lady on a statin. Plus, per cardiology notes, she has refused in the past. CTH was negative for acute findings. She is bradycardic in the 50s, but this is chronic while on Metoprolol. She does not like medicines, so she may be reluctant to start something new.   Impression: 1. TIA   Recommendations: -medicine admit -continue home Xeralto. Defer start of ASA in addition to stroke team.  -MRI brain -MRA neck and brain without contrast (creatinine 1.23) -TSH -LDL is over 70, recommend statin therapy -HbA1C normal 03/24/20.  -permissive HTN x 24 hours up to 210/105. -RN swallow study, if passes, heart healthy diet. -stroke risk modification. -telemetry monitoring for arrhythmia.  -frequent neuro checks per protocol -NIHSS 0, rechecks per protocol. -stroke education -PT/OT -infection workup. UA pending. WBCC normal.   Pt seen by Clance Boll, NP/Neuro and later by MD. Note/plan to be edited by MD as needed.  Pager: [phone]  Seen the patient reviewed the above note.  She has had two episodes in the past few weeks of left hand numbness and weakness concerning for TIAs.  Given that both of them of the same symptoms, I would be concerned with possible small vessel TIA.  She has refused statins in the past, but I think she would be amenable to it.  She does take her Xarelto with a large meal, but with two episodes, could consider changing her to Eliquis, though I do not feel strongly about this.  Stroke team to follow.  Roland Rack, MD [facility] [phone]  If 7pm- 7am, please page neurology on call as listed in [place].

## 2020-04-27 NOTE — ED Notes (Signed)
Patient transported to MRI 

## 2020-04-27 NOTE — ED Triage Notes (Addendum)
Pt to triage via [facility] from home.  Pt had 10-15 min episode of slurred speech, L sided facial droop, and loss of coordination.  Symptoms started around 12:30.  No neuro deficits at this time.  No arm drift.  VAN negative.  Pt states she feels fine.  18g RAC

## 2020-04-27 NOTE — H&P (Signed)
History and Physical  Patient Name: Amber Kennedy     WCB:[id number]    DOB: [birth year]    DOA: 04/27/2020 PCP: [name], MD  Patient coming from: Home  Chief Complaint: Strokelike symptoms    HPI: Amber Kennedy is a 87 y.o. female, with PMH of dyslipidemia, hypertension, atrial fibrillation, gout who presented to the ER on 04/27/2020 with complaint of strokelike symptoms.  Apparently around lunchtime, family noticed patient had some slurred speech, along with facial droop.  She also had some left hand weakness.  Symptoms lasted about 20 minutes.  She has had similar symptoms in the past and usually resolve on their own.  EMS was contacted and her symptoms resolved prior to being evaluated by EMS.  She did not lose consciousness.  She denies any headache, will dizziness, vomiting, syncope or shortness of breath.  She is very independent, gets around fine with all activities of daily living.  Does not use any assistance with ambulation.  She sees cardiology for A. fib and is on Xarelto and metoprolol.  She recently had her metoprolol increased from 75 mg daily to 100 mg due to elevated blood pressure.    ED course: -Vitals on admission: Afebrile, heart rate 43, respiratory rate 16, blood pressure 159/71, maintaining sats on room air -Labs on initial presentation: Sodium 130, potassium 4.7, chloride 106, bicarb 23, glucose 120, BUN 20, creatinine 1.2, WBC 9.2, hemoglobin 14.6, INR 1.4, Covid negative -Imaging obtained on admission: No acute processes but did show chronic microvascular ischemic changes  -In the ED the patient was given IV fluids.  Neurology was consulted by ER provider recommended observation, and the hospitalist service was contacted for further evaluation and management.     ROS: A complete and thorough 12 point review of systems obtained, negative listed in HPI.     Past Medical History:  Diagnosis Date  . A-fib ([place])   . Gout   . Hypercholesterolemia   .  Hypertension   . Hypokalemia   . Hyponatremia   . Vision loss     Past Surgical History:  Procedure Laterality Date  . CESAREAN SECTION  1972    Social History: Patient lives at home alone.  The patient walks without assistance.  Non smoker.  Allergies  Allergen Reactions  . Latex Rash    "Bandaids"   . Penicillin G Hives    Family history: family history includes Sudden death in her father.  Prior to Admission medications   Medication Sig Start Date End Date Taking? Authorizing Provider  allopurinol (ZYLOPRIM) 300 MG tablet Take 150 mg by mouth daily.     [provider]  Ascorbic Acid (VITAMIN C) 1000 MG tablet Take 1,000 mg by mouth daily.    [provider]  Cholecalciferol (VITAMIN D3) 1.25 MG (50000 UT) CAPS Take by mouth daily.    [provider]  furosemide (LASIX) 20 MG tablet Take 1 tablet (20 mg total) by mouth as needed. 09/20/19 12/19/19  Patwardhan, Reynold Bowen, MD  metoprolol succinate (TOPROL-XL) 100 MG 24 hr tablet Take 1 tablet (100 mg total) by mouth daily. 03/24/20   Patwardhan, Reynold Bowen, MD  Multiple Vitamins-Minerals (MULTIVITAMIN WOMEN PO) Take by mouth daily.    [provider]  NON FORMULARY FOCUS FACTOR, TAKE OCCASIONAL    [provider]  rivaroxaban (XARELTO) 20 MG TABS tablet Take 1 tablet (20 mg total) by mouth daily with supper. 03/03/20   Patwardhan, Reynold Bowen, MD  Physical Exam: BP (!) 159/71 (BP Location: Right Arm)   Pulse (!) 43   Temp 98.7 F (37.1 C) (Oral)   Resp 16   SpO2 99%   General appearance: Well-developed, adult female, alert and in no acute distress .   Eyes: Anicteric, conjunctiva pink, lids and lashes normal. PERRL.    ENT: No nasal deformity, discharge, epistaxis.  Hearing intact. OP moist without lesions.   Neck: No neck masses.  Trachea midline.  No thyromegaly/tenderness. Lymph: No cervical or supraclavicular lymphadenopathy. Skin: Warm and dry.  No jaundice.  No  suspicious rashes or lesions. Cardiac: Bradycardia, nl S1-S2, no murmurs appreciated.  No LE edema.  Radial and pedal pulses 2+ and symmetric. Respiratory: Normal respiratory rate and rhythm.  CTAB without rales or wheezes. Abdomen: Abdomen soft.  No tenderness with palpation. No ascites, distension, hepatosplenomegaly.   MSK: No deformities or effusions of the large joints of the upper or lower extremities bilaterally.  No cyanosis or clubbing. Neuro: Cranial nerves 2 through 12 grossly intact.  Sensation intact to light touch. Speech is fluent.  Marland Kitchen    Psych: Sensorium intact and responding to questions, attention normal.  Behavior appropriate.  Judgment and insight appear normal.    Labs on Admission:  I have personally reviewed following labs and imaging studies: CBC: Recent Labs  Lab 04/27/20 1408  WBC 9.2  NEUTROABS 6.5  HGB 14.6  HCT 44.8  MCV 93.7  PLT 250   Basic Metabolic Panel: Recent Labs  Lab 04/27/20 1408  NA 138  K 4.7  CL 106  CO2 23  GLUCOSE 120*  BUN 20  CREATININE 1.23*  CALCIUM 9.6   GFR: CrCl cannot be calculated (Unknown ideal weight.).  Liver Function Tests: Recent Labs  Lab 04/27/20 1408  AST 19  ALT 16  ALKPHOS 63  BILITOT 0.7  PROT 7.0  ALBUMIN 3.8   No results for input(s): LIPASE, AMYLASE in the last 168 hours. No results for input(s): AMMONIA in the last 168 hours. Coagulation Profile: Recent Labs  Lab 04/27/20 1408  INR 1.4*   Cardiac Enzymes: No results for input(s): CKTOTAL, CKMB, CKMBINDEX, TROPONINI in the last 168 hours. BNP (last 3 results) No results for input(s): PROBNP in the last 8760 hours. HbA1C: No results for input(s): HGBA1C in the last 72 hours. CBG: No results for input(s): GLUCAP in the last 168 hours. Lipid Profile: No results for input(s): CHOL, HDL, LDLCALC, TRIG, CHOLHDL, LDLDIRECT in the last 72 hours. Thyroid Function Tests: No results for input(s): TSH, T4TOTAL, FREET4, T3FREE, THYROIDAB in the  last 72 hours. Anemia Panel: No results for input(s): VITAMINB12, FOLATE, FERRITIN, TIBC, IRON, RETICCTPCT in the last 72 hours.   Recent Results (from the past 240 hour(s))  Resp Panel by RT-PCR (Flu A&B, Covid) Nasopharyngeal Swab     Status: None   Collection Time: 04/27/20  2:35 PM   Specimen: Nasopharyngeal Swab; Nasopharyngeal(NP) swabs in vial transport medium  Result Value Ref Range Status   SARS Coronavirus 2 by RT PCR NEGATIVE NEGATIVE Final    Comment: (NOTE) SARS-CoV-2 target nucleic acids are NOT DETECTED.  The SARS-CoV-2 RNA is generally detectable in upper respiratory specimens during the acute phase of infection. The lowest concentration of SARS-CoV-2 viral copies this assay can detect is 138 copies/mL. A negative result does not preclude SARS-Cov-2 infection and should not be used as the sole basis for treatment or other patient management decisions. A negative result may occur with  improper specimen collection/handling,  submission of specimen other than nasopharyngeal swab, presence of viral mutation(s) within the areas targeted by this assay, and inadequate number of viral copies(<138 copies/mL). A negative result must be combined with clinical observations, patient history, and epidemiological information. The expected result is Negative.  Fact Sheet for Patients:  EntrepreneurPulse.com.au  Fact Sheet for Healthcare Providers:  IncredibleEmployment.be  This test is no t yet approved or cleared by the [place] FDA and  has been authorized for detection and/or diagnosis of SARS-CoV-2 by FDA under an Emergency Use Authorization (EUA). This EUA will remain  in effect (meaning this test can be used) for the duration of the COVID-19 declaration under Section 564(b)(1) of the Act, 21 U.S.C.section 360bbb-3(b)(1), unless the authorization is terminated  or revoked sooner.       Influenza A by PCR NEGATIVE NEGATIVE Final    Influenza B by PCR NEGATIVE NEGATIVE Final    Comment: (NOTE) The Xpert Xpress SARS-CoV-2/FLU/RSV plus assay is intended as an aid in the diagnosis of influenza from Nasopharyngeal swab specimens and should not be used as a sole basis for treatment. Nasal washings and aspirates are unacceptable for Xpert Xpress SARS-CoV-2/FLU/RSV testing.  Fact Sheet for Patients: EntrepreneurPulse.com.au  Fact Sheet for Healthcare Providers: IncredibleEmployment.be  This test is not yet approved or cleared by the [place] FDA and has been authorized for detection and/or diagnosis of SARS-CoV-2 by FDA under an Emergency Use Authorization (EUA). This EUA will remain in effect (meaning this test can be used) for the duration of the COVID-19 declaration under Section 564(b)(1) of the Act, 21 U.S.C. section 360bbb-3(b)(1), unless the authorization is terminated or revoked.  Performed at [facility], [place] [address] [place], [place] [address]            Radiological Exams on Admission: Personally reviewed imaging which shows: CT the head without contrast showed no evidence of acute abnormality CT HEAD WO CONTRAST  Result Date: 04/27/2020 CLINICAL DATA:  Transient ischemic attack. Slurred speech and left-sided facial droop. EXAM: CT HEAD WITHOUT CONTRAST TECHNIQUE: Contiguous axial images were obtained from the base of the skull through the vertex without intravenous contrast. COMPARISON:  None. FINDINGS: Brain: No evidence of acute large vascular territory infarct. No acute hemorrhage. No mass lesion or abnormal mass effect. No hydrocephalus. Mild-to-moderate patchy white matter hypoattenuation, likely related to chronic microvascular ischemic disease. Mild to moderate generalized cerebral volume loss. Vascular: Calcific atherosclerosis. No hyperdense vessel identified. Skull: No acute fracture. Sinuses/Orbits: Mild paranasal sinus mucosal thickening.  Unremarkable orbits. Other: No mastoid effusions. IMPRESSION: 1. No evidence of acute intracranial abnormality. 2. Mild to moderate chronic microvascular ischemic disease and generalized cerebral volume loss. Electronically Signed   By: Margaretha Sheffield MD   On: 04/27/2020 15:44      Assessment/Plan   1.  TIA symptoms -Patient presented after 10 to 15 minutes of slurred speech, left-sided facial droop and muscle skeletal coordination issues -CT head on admission showed no acute processes but did show chronic microvascular ischemic changes -Neurology consulted by the ER, recommended placement in observation -Echocardiogram ordered -MRI and MRA of the brain ordered -IV fluids -PT, OT and speech consulted -Patient does not want to be placed on statin -Deferring starting aspirin to neurology -Permissive hypertension -Place in observation with telemetry  2.  Bradycardia -Likely related to increase beta-blocker -Hold home metoprolol for now  3.  Paroxysmal atrial fib -Continue home Xarelto -Hold home metoprolol for now given bradycardia  4.  Essential hypertension -Hold  home metoprolol due to bradycardia -Permissive hypertension given TIA      DVT prophylaxis: Home Xarelto Code Status: DNR Family Communication: Son bedside Disposition Plan: Anticipate discharge home when medically optimized Consults called: Neurology consulted in the ER Admission status: Observation   At the point of initial evaluation, it is my clinical opinion that admission for OBSERVATION is reasonable and necessary because the patient's presenting complaints in the context of their chronic conditions represent sufficient risk of deterioration or significant morbidity to constitute reasonable grounds for close observation in the hospital setting, but that the patient may be medically stable for discharge from the hospital within 24 to 48 hours.    Medical decision making: Patient seen at 4:59 PM on  04/27/2020.  The patient was discussed with ER provider.  What exists of the patient's chart was reviewed in depth and summarized above.  Clinical condition: Fair.        Doran Heater [facility] Please page though [place] or Epic secure chat:  For password, contact charge nurse

## 2020-04-27 NOTE — Plan of Care (Signed)
°  Problem: Clinical Measurements: °Goal: Diagnostic test results will improve °Outcome: Progressing °  °Problem: Clinical Measurements: °Goal: Will remain free from infection °Outcome: Progressing °  °

## 2020-04-28 ENCOUNTER — Observation Stay (HOSPITAL_COMMUNITY): Payer: Medicare HMO

## 2020-04-28 ENCOUNTER — Encounter (HOSPITAL_COMMUNITY): Payer: Self-pay | Admitting: Internal Medicine

## 2020-04-28 DIAGNOSIS — E871 Hypo-osmolality and hyponatremia: Secondary | ICD-10-CM | POA: Diagnosis present

## 2020-04-28 DIAGNOSIS — Z87891 Personal history of nicotine dependence: Secondary | ICD-10-CM | POA: Diagnosis not present

## 2020-04-28 DIAGNOSIS — R001 Bradycardia, unspecified: Secondary | ICD-10-CM | POA: Diagnosis not present

## 2020-04-28 DIAGNOSIS — I480 Paroxysmal atrial fibrillation: Secondary | ICD-10-CM | POA: Diagnosis present

## 2020-04-28 DIAGNOSIS — G459 Transient cerebral ischemic attack, unspecified: Secondary | ICD-10-CM | POA: Diagnosis present

## 2020-04-28 DIAGNOSIS — Z79899 Other long term (current) drug therapy: Secondary | ICD-10-CM | POA: Diagnosis not present

## 2020-04-28 DIAGNOSIS — I495 Sick sinus syndrome: Secondary | ICD-10-CM | POA: Diagnosis present

## 2020-04-28 DIAGNOSIS — H3413 Central retinal artery occlusion, bilateral: Secondary | ICD-10-CM | POA: Diagnosis present

## 2020-04-28 DIAGNOSIS — I110 Hypertensive heart disease with heart failure: Secondary | ICD-10-CM | POA: Diagnosis present

## 2020-04-28 DIAGNOSIS — I10 Essential (primary) hypertension: Secondary | ICD-10-CM | POA: Diagnosis not present

## 2020-04-28 DIAGNOSIS — M109 Gout, unspecified: Secondary | ICD-10-CM | POA: Diagnosis present

## 2020-04-28 DIAGNOSIS — I2510 Atherosclerotic heart disease of native coronary artery without angina pectoris: Secondary | ICD-10-CM | POA: Diagnosis present

## 2020-04-28 DIAGNOSIS — M50323 Other cervical disc degeneration at C6-C7 level: Secondary | ICD-10-CM | POA: Diagnosis not present

## 2020-04-28 DIAGNOSIS — Z91040 Latex allergy status: Secondary | ICD-10-CM | POA: Diagnosis not present

## 2020-04-28 DIAGNOSIS — Z888 Allergy status to other drugs, medicaments and biological substances status: Secondary | ICD-10-CM | POA: Diagnosis not present

## 2020-04-28 DIAGNOSIS — M50322 Other cervical disc degeneration at C5-C6 level: Secondary | ICD-10-CM | POA: Diagnosis not present

## 2020-04-28 DIAGNOSIS — Z880 Allergy status to penicillin: Secondary | ICD-10-CM | POA: Diagnosis not present

## 2020-04-28 DIAGNOSIS — E785 Hyperlipidemia, unspecified: Secondary | ICD-10-CM | POA: Diagnosis present

## 2020-04-28 DIAGNOSIS — I169 Hypertensive crisis, unspecified: Secondary | ICD-10-CM | POA: Diagnosis not present

## 2020-04-28 DIAGNOSIS — E7800 Pure hypercholesterolemia, unspecified: Secondary | ICD-10-CM | POA: Diagnosis present

## 2020-04-28 DIAGNOSIS — I6522 Occlusion and stenosis of left carotid artery: Secondary | ICD-10-CM

## 2020-04-28 DIAGNOSIS — I6523 Occlusion and stenosis of bilateral carotid arteries: Secondary | ICD-10-CM | POA: Diagnosis not present

## 2020-04-28 DIAGNOSIS — I671 Cerebral aneurysm, nonruptured: Secondary | ICD-10-CM | POA: Diagnosis present

## 2020-04-28 DIAGNOSIS — I5032 Chronic diastolic (congestive) heart failure: Secondary | ICD-10-CM | POA: Diagnosis present

## 2020-04-28 DIAGNOSIS — Z20822 Contact with and (suspected) exposure to covid-19: Secondary | ICD-10-CM | POA: Diagnosis present

## 2020-04-28 DIAGNOSIS — Z66 Do not resuscitate: Secondary | ICD-10-CM | POA: Diagnosis present

## 2020-04-28 DIAGNOSIS — Z7901 Long term (current) use of anticoagulants: Secondary | ICD-10-CM | POA: Diagnosis not present

## 2020-04-28 DIAGNOSIS — I771 Stricture of artery: Secondary | ICD-10-CM | POA: Diagnosis not present

## 2020-04-28 DIAGNOSIS — H547 Unspecified visual loss: Secondary | ICD-10-CM | POA: Diagnosis present

## 2020-04-28 DIAGNOSIS — I272 Pulmonary hypertension, unspecified: Secondary | ICD-10-CM | POA: Diagnosis present

## 2020-04-28 DIAGNOSIS — I4892 Unspecified atrial flutter: Secondary | ICD-10-CM | POA: Diagnosis present

## 2020-04-28 DIAGNOSIS — I517 Cardiomegaly: Secondary | ICD-10-CM | POA: Diagnosis not present

## 2020-04-28 DIAGNOSIS — I455 Other specified heart block: Secondary | ICD-10-CM | POA: Diagnosis not present

## 2020-04-28 DIAGNOSIS — I4891 Unspecified atrial fibrillation: Secondary | ICD-10-CM | POA: Diagnosis not present

## 2020-04-28 LAB — COMPREHENSIVE METABOLIC PANEL
ALT: 15 U/L (ref 0–44)
AST: 19 U/L (ref 15–41)
Albumin: 3.2 g/dL — ABNORMAL LOW (ref 3.5–5.0)
Alkaline Phosphatase: 51 U/L (ref 38–126)
Anion gap: 7 (ref 5–15)
BUN: 22 mg/dL (ref 8–23)
CO2: 25 mmol/L (ref 22–32)
Calcium: 9.1 mg/dL (ref 8.9–10.3)
Chloride: 106 mmol/L (ref 98–111)
Creatinine, Ser: 1.05 mg/dL — ABNORMAL HIGH (ref 0.44–1.00)
GFR, Estimated: 52 mL/min — ABNORMAL LOW (ref >60)
Glucose, Bld: 100 mg/dL — ABNORMAL HIGH (ref 70–99)
Potassium: 3.9 mmol/L (ref 3.5–5.1)
Sodium: 138 mmol/L (ref 135–145)
Total Bilirubin: 0.6 mg/dL (ref 0.3–1.2)
Total Protein: 6 g/dL — ABNORMAL LOW (ref 6.5–8.1)

## 2020-04-28 LAB — URINALYSIS, ROUTINE W REFLEX MICROSCOPIC
Bilirubin Urine: NEGATIVE
Glucose, UA: NEGATIVE mg/dL
Hgb urine dipstick: NEGATIVE
Ketones, ur: NEGATIVE mg/dL
Nitrite: NEGATIVE
Protein, ur: NEGATIVE mg/dL
Specific Gravity, Urine: 1.011 (ref 1.005–1.030)
pH: 5 (ref 5.0–8.0)

## 2020-04-28 LAB — LIPID PANEL
Cholesterol: 152 mg/dL (ref 0–200)
HDL: 32 mg/dL — ABNORMAL LOW (ref >40)
LDL Cholesterol: 91 mg/dL (ref 0–99)
Total CHOL/HDL Ratio: 4.8 RATIO
Triglycerides: 143 mg/dL (ref <150)
VLDL: 29 mg/dL (ref 0–40)

## 2020-04-28 LAB — HEMOGLOBIN A1C
Hgb A1c MFr Bld: 5.7 % — ABNORMAL HIGH (ref 4.8–5.6)
Mean Plasma Glucose: 116.89 mg/dL

## 2020-04-28 LAB — RAPID URINE DRUG SCREEN, HOSP PERFORMED
Amphetamines: NOT DETECTED
Barbiturates: NOT DETECTED
Benzodiazepines: NOT DETECTED
Cocaine: NOT DETECTED
Opiates: NOT DETECTED
Tetrahydrocannabinol: NOT DETECTED

## 2020-04-28 LAB — ECHOCARDIOGRAM COMPLETE
Area-P 1/2: 3.5 cm2
Calc EF: 75.8 %
S' Lateral: 2.2 cm
Single Plane A2C EF: 78.4 %
Single Plane A4C EF: 73.2 %

## 2020-04-28 LAB — TSH
TSH: 3.836 u[IU]/mL (ref 0.350–4.500)
TSH: 3.057 u[IU]/mL (ref 0.350–4.500)

## 2020-04-28 LAB — MAGNESIUM: Magnesium: 2 mg/dL (ref 1.7–2.4)

## 2020-04-28 IMAGING — CT CT ANGIO NECK
2 of 7 series · 8 of 33 positions shown · IV contrast (APPLIED)
Comparison: MRA  neck [DATE]

CLINICAL DATA: TIA.  Carotid stenosis.

EXAM:
CT ANGIOGRAPHY NECK
TECHNIQUE: Multidetector CT imaging of the neck was performed using the
standard protocol during bolus administration of intravenous
contrast. Multiplanar CT image reconstructions and MIPs were
obtained to evaluate the vascular anatomy. Carotid stenosis
measurements (when applicable) are obtained utilizing NASCET
criteria, using the distal internal carotid diameter as the
denominator.
CONTRAST:  50mL OMNIPAQUE IOHEXOL 350 MG/ML SOLN

[Series 5: cta neck · axial · 0.49mm/px · z∈[-234,-160]mm · 2 of 111 slices shown]
[im 37/111  soft-tissue]
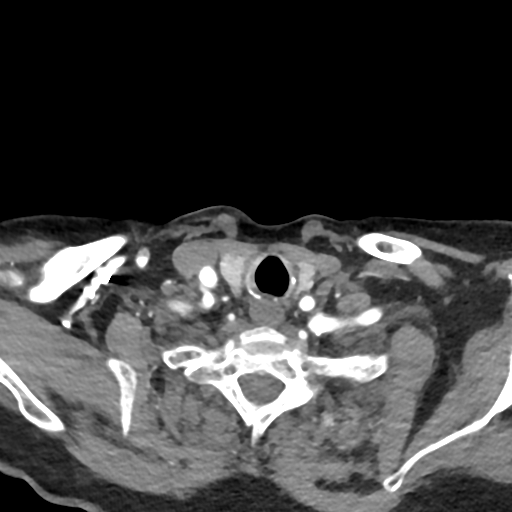
[im 74/111  soft-tissue]
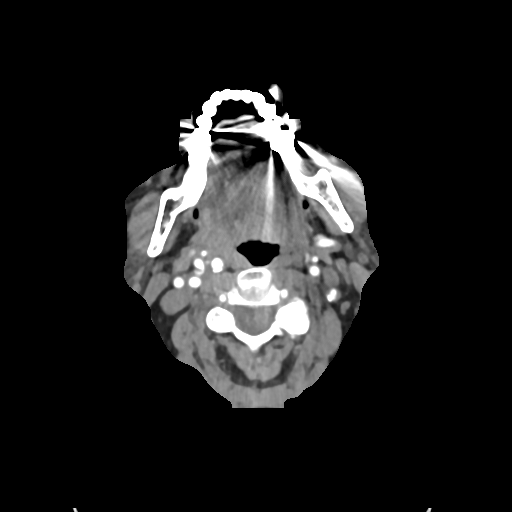

[Series 7: ax thins · axial · 0.50mm/px · z∈[-283,-122]mm · 6 of 226 slices shown]
[im 33/226  soft-tissue]
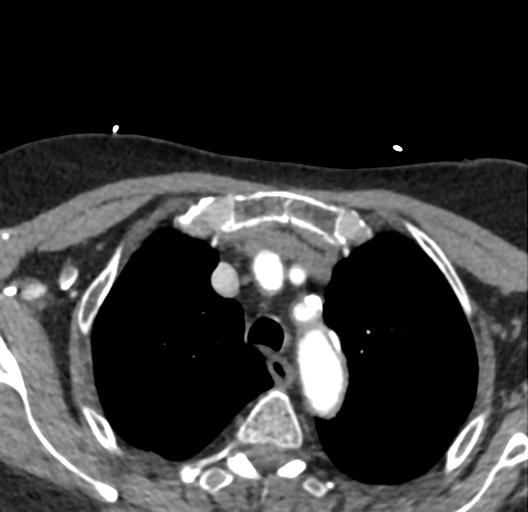
[im 65/226  bone]
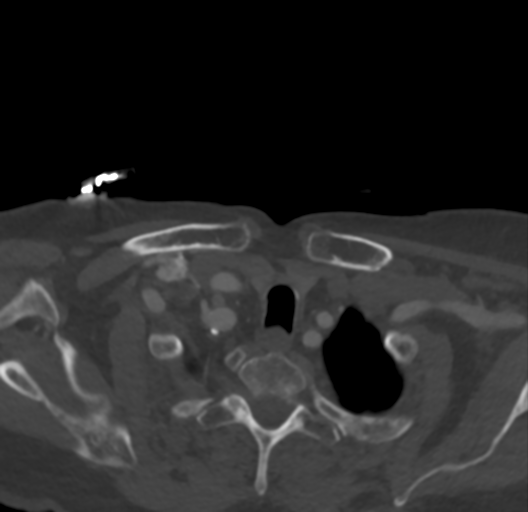
[im 97/226  soft-tissue]
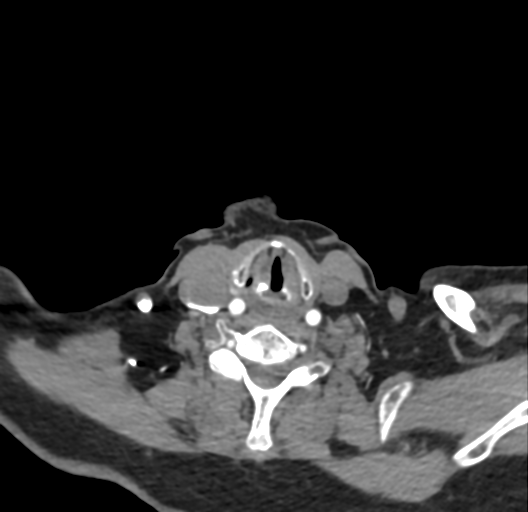
[im 129/226  bone]
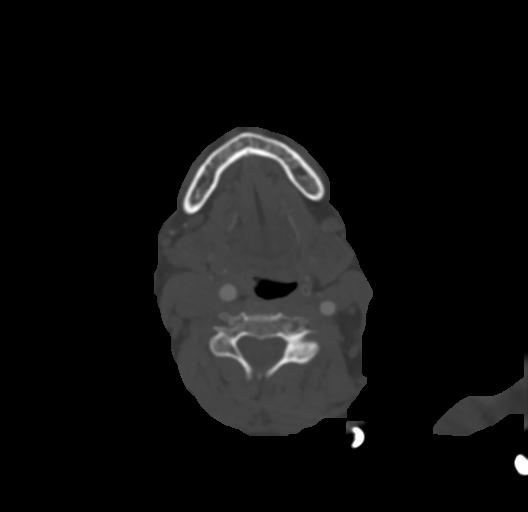
[im 161/226  soft-tissue]
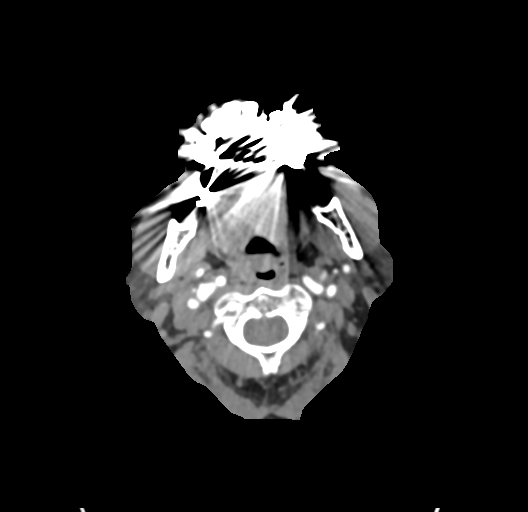
[im 193/226  bone]
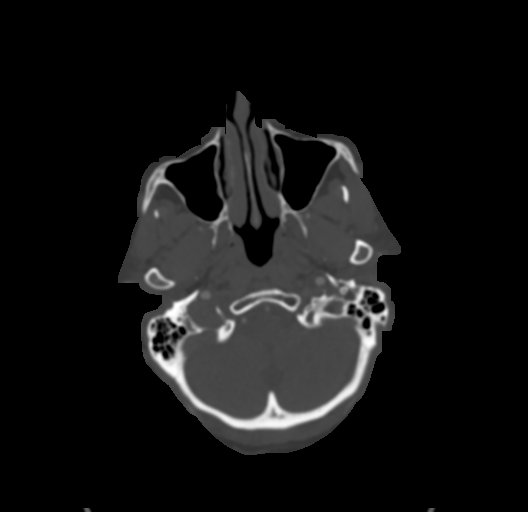

[8 of 33 positions shown; findings below may reference images not displayed]

FINDINGS: Aortic arch: Moderate atherosclerotic calcification aortic arch
without aneurysm or dissection. Atherosclerotic disease without
stenosis in the proximal great vessels.

Right carotid system: Mild atherosclerotic disease proximal right
internal carotid artery without significant stenosis. Tortuosity of
the right internal carotid artery

Left carotid system: Mild atherosclerotic disease left common
carotid artery. Atherosclerotic disease left carotid bifurcation and
proximal left internal carotid artery. Minimal luminal diameter of
the internal carotid artery 1.8 mm, corresponding to 60% diameter
stenosis. Tortuosity of the left internal carotid artery.

Vertebral arteries: Both vertebral arteries patent to the basilar
without stenosis.

Skeleton: Disc degeneration and mild spurring C5-6 and C6-7. No
acute skeletal abnormality.

Other neck: Negative

Upper chest: Lung apices clear bilaterally.
IMPRESSION: 1. 60% diameter stenosis proximal left internal carotid artery due
to atherosclerotic disease
2. Right carotid artery widely patent without stenosis. Both
vertebral arteries widely patent.
3. Atherosclerotic aortic arch.

## 2020-04-28 MED ORDER — ALPRAZOLAM 0.5 MG PO TABS
0.5000 mg | ORAL_TABLET | Freq: Two times a day (BID) | ORAL | Status: DC | PRN
  Administered 2020-04-29 – 2020-04-30 (×3): 0.5 mg via ORAL
  Filled 2020-04-28 (×3): qty 1

## 2020-04-28 MED ORDER — METOPROLOL TARTRATE 12.5 MG HALF TABLET
12.5000 mg | ORAL_TABLET | Freq: Once | ORAL | Status: AC
  Administered 2020-04-28: 12.5 mg via ORAL
  Filled 2020-04-28: qty 1

## 2020-04-28 MED ORDER — IOHEXOL 350 MG/ML SOLN
50.0000 mL | Freq: Once | INTRAVENOUS | Status: AC | PRN
  Administered 2020-04-28: 50 mL via INTRAVENOUS

## 2020-04-28 MED ORDER — DILTIAZEM HCL 30 MG PO TABS
30.0000 mg | ORAL_TABLET | Freq: Three times a day (TID) | ORAL | Status: DC
  Administered 2020-04-28: 30 mg via ORAL
  Filled 2020-04-28: qty 1

## 2020-04-28 MED ORDER — DILTIAZEM HCL-DEXTROSE 125-5 MG/125ML-% IV SOLN (PREMIX)
5.0000 mg/h | INTRAVENOUS | Status: DC
  Administered 2020-04-28 – 2020-04-29 (×2): 5 mg/h via INTRAVENOUS
  Filled 2020-04-28 (×2): qty 125

## 2020-04-28 MED ORDER — APIXABAN 5 MG PO TABS
5.0000 mg | ORAL_TABLET | Freq: Two times a day (BID) | ORAL | Status: DC
  Administered 2020-04-28 – 2020-04-29 (×3): 5 mg via ORAL
  Filled 2020-04-28 (×3): qty 1

## 2020-04-28 MED ORDER — METOPROLOL SUCCINATE ER 100 MG PO TB24
100.0000 mg | ORAL_TABLET | Freq: Every day | ORAL | Status: DC
  Administered 2020-04-28 – 2020-05-02 (×4): 100 mg via ORAL
  Filled 2020-04-28 (×5): qty 1

## 2020-04-28 NOTE — Progress Notes (Addendum)
STROKE TEAM PROGRESS NOTE   INTERVAL HISTORY Her daughters are at the bedside.  Amber Kennedy is a 87 y.o. female, with PMH of dyslipidemia, hypertension, atrial fibrillation, gout who presented to the ER on 04/27/2020 with complaint of slurred speech, along with left sided facial droop.  She also had some left hand weakness. On exam today patient reports that the symptoms lasted approx 30-40 min before having complete resolution of symptoms.   Patient continues to endorse no reoccurrence of symptoms. Patient does reports that she has not received her Metoprolol XL today and she can fill her irregular heart beat and rate since she has returned from CT imaging. Patient otherwise does not note any discomfort or pain. Patient does report that she had 4-5 episodes over the past 2 weeks of 10-50min numbness in the L hand. Patient's daughter also interjects that the patient has been having a difficult time taking her Xarelto at the same time everyday because she is required to take it with food, but often finds her self not hungry.   Vitals:   04/28/20 0157 04/28/20 0339 04/28/20 0732 04/28/20 1226  BP: (!) 180/57 (!) 178/59 (!) 156/50 (!) 161/81  Pulse: (!) 50 (!) 56 (!) 52 90  Resp: 14 16 18 17   Temp: (!) 97.3 F (36.3 C) 97.6 F (36.4 C) 97.7 F (36.5 C) 97.8 F (36.6 C)  TempSrc: Oral Oral Oral Oral  SpO2: 99% 100% 100% 99%   CBC:  Recent Labs  Lab 04/27/20 1408  WBC 9.2  NEUTROABS 6.5  HGB 14.6  HCT 44.8  MCV 93.7  PLT 388   Basic Metabolic Panel:  Recent Labs  Lab 04/27/20 1408 04/28/20 0934  NA 138 138  K 4.7 3.9  CL 106 106  CO2 23 25  GLUCOSE 120* 100*  BUN 20 22  CREATININE 1.23* 1.05*  CALCIUM 9.6 9.1   Lipid Panel:  Recent Labs  Lab 04/28/20 0934  CHOL 152  TRIG 143  HDL 32*  CHOLHDL 4.8  VLDL 29  LDLCALC 91   HgbA1c:  Recent Labs  Lab 04/28/20 0934  HGBA1C 5.7*   Urine Drug Screen:  Recent Labs  Lab 04/28/20 0625  LABOPIA NONE DETECTED   COCAINSCRNUR NONE DETECTED  LABBENZ NONE DETECTED  AMPHETMU NONE DETECTED  THCU NONE DETECTED  LABBARB NONE DETECTED    Alcohol Level  Recent Labs  Lab 04/27/20 1408  ETH <10    IMAGING past 24 hours CT HEAD WO CONTRAST  Result Date: 04/27/2020 CLINICAL DATA:  Transient ischemic attack. Slurred speech and left-sided facial droop. EXAM: CT HEAD WITHOUT CONTRAST TECHNIQUE: Contiguous axial images were obtained from the base of the skull through the vertex without intravenous contrast. COMPARISON:  None. FINDINGS: Brain: No evidence of acute large vascular territory infarct. No acute hemorrhage. No mass lesion or abnormal mass effect. No hydrocephalus. Mild-to-moderate patchy white matter hypoattenuation, likely related to chronic microvascular ischemic disease. Mild to moderate generalized cerebral volume loss. Vascular: Calcific atherosclerosis. No hyperdense vessel identified. Skull: No acute fracture. Sinuses/Orbits: Mild paranasal sinus mucosal thickening. Unremarkable orbits. Other: No mastoid effusions. IMPRESSION: 1. No evidence of acute intracranial abnormality. 2. Mild to moderate chronic microvascular ischemic disease and generalized cerebral volume loss. Electronically Signed   By: Margaretha Sheffield MD   On: 04/27/2020 15:44   CT ANGIO NECK W OR WO CONTRAST  Result Date: 04/28/2020 CLINICAL DATA:  TIA.  Carotid stenosis. EXAM: CT ANGIOGRAPHY NECK TECHNIQUE: Multidetector CT imaging of the neck  was performed using the standard protocol during bolus administration of intravenous contrast. Multiplanar CT image reconstructions and MIPs were obtained to evaluate the vascular anatomy. Carotid stenosis measurements (when applicable) are obtained utilizing NASCET criteria, using the distal internal carotid diameter as the denominator. CONTRAST:  5mL OMNIPAQUE IOHEXOL 350 MG/ML SOLN COMPARISON:  MRA  neck 04/27/2020 FINDINGS: Aortic arch: Moderate atherosclerotic calcification aortic arch  without aneurysm or dissection. Atherosclerotic disease without stenosis in the proximal great vessels. Right carotid system: Mild atherosclerotic disease proximal right internal carotid artery without significant stenosis. Tortuosity of the right internal carotid artery Left carotid system: Mild atherosclerotic disease left common carotid artery. Atherosclerotic disease left carotid bifurcation and proximal left internal carotid artery. Minimal luminal diameter of the internal carotid artery 1.8 mm, corresponding to 60% diameter stenosis. Tortuosity of the left internal carotid artery. Vertebral arteries: Both vertebral arteries patent to the basilar without stenosis. Skeleton: Disc degeneration and mild spurring C5-6 and C6-7. No acute skeletal abnormality. Other neck: Negative Upper chest: Lung apices clear bilaterally. IMPRESSION: 1. 60% diameter stenosis proximal left internal carotid artery due to atherosclerotic disease 2. Right carotid artery widely patent without stenosis. Both vertebral arteries widely patent. 3. Atherosclerotic aortic arch. Electronically Signed   By: Franchot Gallo M.D.   On: 04/28/2020 11:24   MR ANGIO HEAD WO CONTRAST  Result Date: 04/27/2020 CLINICAL DATA:  TIA. Episode of slurred speech and facial droop. Left hand weakness. Symptoms lasted 20 minutes. EXAM: MRI HEAD WITHOUT CONTRAST MRA HEAD WITHOUT CONTRAST MRA NECK WITHOUT CONTRAST TECHNIQUE: Multiplanar, multiecho pulse sequences of the brain and surrounding structures were obtained without intravenous contrast. Angiographic images of the Circle of Willis were obtained using MRA technique without intravenous contrast. Angiographic images of the neck were obtained using MRA technique without intravenous contrast. Carotid stenosis measurements (when applicable) are obtained utilizing NASCET criteria, using the distal internal carotid diameter as the denominator. COMPARISON:  CT head without contrast 04/27/2020 FINDINGS: MRI HEAD  FINDINGS Brain: Moderate generalized atrophy and white matter disease is present bilaterally. Asymmetric subcortical white matter changes are present in the right frontal operculum and corona radiata. The ventricles are proportionate to the degree of atrophy. No significant extraaxial fluid collection is present. Basal ganglia are within normal limits. The brainstem and cerebellum are within normal limits. The internal auditory canals are within normal limits. Vascular: Flow is present in the major intracranial arteries. Skull and upper cervical spine: The craniocervical junction is normal. Upper cervical spine is within normal limits. Marrow signal is unremarkable. Sinuses/Orbits: The paranasal sinuses and mastoid air cells are clear. The globes and orbits are within normal limits. MRA HEAD FINDINGS The internal carotid arteries are within normal limits from the high cervical segments through the ICA termini bilaterally. The A1 and M1 segments are normal. 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 MCA bifurcations are within normal limits. Moderate 10 UA shin of distal MCA branch vessels is present bilaterally without a significant proximal stenosis or occlusion. Vertebral arteries are codominant. Right PICA origin visualized and normal. Left AICA is dominant. Mild narrowing is present in the distal vertebral artery at the vertebrobasilar junction. The basilar artery is normal. Left posterior cerebral artery originates from basilar tip. The right posterior cerebral artery is of fetal type. Attenuation PCA branch vessels is noted bilaterally MRA NECK FINDINGS Time-of-flight images demonstrate a 3 vessel arch configuration. Significant signal loss is noted at the proximal left internal carotid artery with decreased more distal signal. There  is tortuosity of the internal carotid arteries bilaterally. Flow is antegrade in the vertebral arteries bilaterally. IMPRESSION: 1. No acute intracranial  abnormality. 2. Moderate generalized atrophy and white matter disease likely reflects the sequela of chronic microvascular ischemia. 3. No significant proximal stenosis, aneurysm, or branch vessel occlusion within the Circle of Willis. 4. 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 5. Moderate distal small vessel disease in the anterior and posterior circulation without other significant proximal stenosis, aneurysm, or branch vessel occlusion. 6. Moderate to high-grade stenosis of the proximal left internal carotid artery suggest on the time-of-flight images. This could be confirmed with carotid Doppler study or CTA. It is contralateral to the symptomatic side. Electronically Signed   By: San Morelle M.D.   On: 04/27/2020 19:49   MR ANGIO NECK WO CONTRAST  Result Date: 04/27/2020 CLINICAL DATA:  TIA. Episode of slurred speech and facial droop. Left hand weakness. Symptoms lasted 20 minutes. EXAM: MRI HEAD WITHOUT CONTRAST MRA HEAD WITHOUT CONTRAST MRA NECK WITHOUT CONTRAST TECHNIQUE: Multiplanar, multiecho pulse sequences of the brain and surrounding structures were obtained without intravenous contrast. Angiographic images of the Circle of Willis were obtained using MRA technique without intravenous contrast. Angiographic images of the neck were obtained using MRA technique without intravenous contrast. Carotid stenosis measurements (when applicable) are obtained utilizing NASCET criteria, using the distal internal carotid diameter as the denominator. COMPARISON:  CT head without contrast 04/27/2020 FINDINGS: MRI HEAD FINDINGS Brain: Moderate generalized atrophy and white matter disease is present bilaterally. Asymmetric subcortical white matter changes are present in the right frontal operculum and corona radiata. The ventricles are proportionate to the degree of atrophy. No significant extraaxial fluid collection is present. Basal ganglia are within normal limits. The brainstem and cerebellum  are within normal limits. The internal auditory canals are within normal limits. Vascular: Flow is present in the major intracranial arteries. Skull and upper cervical spine: The craniocervical junction is normal. Upper cervical spine is within normal limits. Marrow signal is unremarkable. Sinuses/Orbits: The paranasal sinuses and mastoid air cells are clear. The globes and orbits are within normal limits. MRA HEAD FINDINGS The internal carotid arteries are within normal limits from the high cervical segments through the ICA termini bilaterally. The A1 and M1 segments are normal. 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 MCA bifurcations are within normal limits. Moderate 10 UA shin of distal MCA branch vessels is present bilaterally without a significant proximal stenosis or occlusion. Vertebral arteries are codominant. Right PICA origin visualized and normal. Left AICA is dominant. Mild narrowing is present in the distal vertebral artery at the vertebrobasilar junction. The basilar artery is normal. Left posterior cerebral artery originates from basilar tip. The right posterior cerebral artery is of fetal type. Attenuation PCA branch vessels is noted bilaterally MRA NECK FINDINGS Time-of-flight images demonstrate a 3 vessel arch configuration. Significant signal loss is noted at the proximal left internal carotid artery with decreased more distal signal. There is tortuosity of the internal carotid arteries bilaterally. Flow is antegrade in the vertebral arteries bilaterally. IMPRESSION: 1. No acute intracranial abnormality. 2. Moderate generalized atrophy and white matter disease likely reflects the sequela of chronic microvascular ischemia. 3. No significant proximal stenosis, aneurysm, or branch vessel occlusion within the Circle of Willis. 4. 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 5. Moderate distal small vessel disease in the anterior and posterior circulation without  other significant proximal stenosis, aneurysm, or branch vessel occlusion. 6. Moderate to high-grade stenosis of  the proximal left internal carotid artery suggest on the time-of-flight images. This could be confirmed with carotid Doppler study or CTA. It is contralateral to the symptomatic side. Electronically Signed   By: San Morelle M.D.   On: 04/27/2020 19:49   MR BRAIN WO CONTRAST  Result Date: 04/27/2020 CLINICAL DATA:  TIA. Episode of slurred speech and facial droop. Left hand weakness. Symptoms lasted 20 minutes. EXAM: MRI HEAD WITHOUT CONTRAST MRA HEAD WITHOUT CONTRAST MRA NECK WITHOUT CONTRAST TECHNIQUE: Multiplanar, multiecho pulse sequences of the brain and surrounding structures were obtained without intravenous contrast. Angiographic images of the Circle of Willis were obtained using MRA technique without intravenous contrast. Angiographic images of the neck were obtained using MRA technique without intravenous contrast. Carotid stenosis measurements (when applicable) are obtained utilizing NASCET criteria, using the distal internal carotid diameter as the denominator. COMPARISON:  CT head without contrast 04/27/2020 FINDINGS: MRI HEAD FINDINGS Brain: Moderate generalized atrophy and white matter disease is present bilaterally. Asymmetric subcortical white matter changes are present in the right frontal operculum and corona radiata. The ventricles are proportionate to the degree of atrophy. No significant extraaxial fluid collection is present. Basal ganglia are within normal limits. The brainstem and cerebellum are within normal limits. The internal auditory canals are within normal limits. Vascular: Flow is present in the major intracranial arteries. Skull and upper cervical spine: The craniocervical junction is normal. Upper cervical spine is within normal limits. Marrow signal is unremarkable. Sinuses/Orbits: The paranasal sinuses and mastoid air cells are clear. The globes and orbits  are within normal limits. MRA HEAD FINDINGS The internal carotid arteries are within normal limits from the high cervical segments through the ICA termini bilaterally. The A1 and M1 segments are normal. 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 MCA bifurcations are within normal limits. Moderate 10 UA shin of distal MCA branch vessels is present bilaterally without a significant proximal stenosis or occlusion. Vertebral arteries are codominant. Right PICA origin visualized and normal. Left AICA is dominant. Mild narrowing is present in the distal vertebral artery at the vertebrobasilar junction. The basilar artery is normal. Left posterior cerebral artery originates from basilar tip. The right posterior cerebral artery is of fetal type. Attenuation PCA branch vessels is noted bilaterally MRA NECK FINDINGS Time-of-flight images demonstrate a 3 vessel arch configuration. Significant signal loss is noted at the proximal left internal carotid artery with decreased more distal signal. There is tortuosity of the internal carotid arteries bilaterally. Flow is antegrade in the vertebral arteries bilaterally. IMPRESSION: 1. No acute intracranial abnormality. 2. Moderate generalized atrophy and white matter disease likely reflects the sequela of chronic microvascular ischemia. 3. No significant proximal stenosis, aneurysm, or branch vessel occlusion within the Circle of Willis. 4. 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 5. Moderate distal small vessel disease in the anterior and posterior circulation without other significant proximal stenosis, aneurysm, or branch vessel occlusion. 6. Moderate to high-grade stenosis of the proximal left internal carotid artery suggest on the time-of-flight images. This could be confirmed with carotid Doppler study or CTA. It is contralateral to the symptomatic side. Electronically Signed   By: San Morelle M.D.   On: 04/27/2020 19:49    ECHOCARDIOGRAM COMPLETE  Result Date: 04/28/2020    ECHOCARDIOGRAM REPORT   Patient Name:   Lashundra PORSCHEA BORYS Date of Exam: 04/28/2020 Medical Rec #:  [id number]     Height:       67.0 in Accession #:  [id number]    Weight:       180.0 lb Date of Birth:  08-Jul-1933     BSA:          1.934 m Patient Age:    36 years      BP:           156/50 mmHg Patient Gender: F             HR:           54 bpm. Exam Location:  Inpatient Procedure: 2D Echo, 3D Echo, Cardiac Doppler and Color Doppler Indications:    TIA  History:        Patient has prior history of Echocardiogram examinations, most                 recent 02/06/2017. CHF, Abnormal ECG and Prior CABG, TIA,                 Arrythmias:Atrial Fibrillation; Risk Factors:Diabetes. Edema.  Sonographer:    Roseanna Rainbow RDCS Referring Phys: [id number] [place]  Sonographer Comments: Suboptimal parasternal window. IMPRESSIONS  1. Left ventricular ejection fraction, by estimation, is 60 to 65%. The left ventricle has normal function. The left ventricle has no regional wall motion abnormalities. There is severe left ventricular hypertrophy. Left ventricular diastolic parameters  are indeterminate.  2. Right ventricular systolic function is normal. The right ventricular size is normal. There is mildly elevated pulmonary artery systolic pressure.  3. Left atrial size was moderately dilated.  4. The mitral valve is degenerative. Mild mitral valve regurgitation. No evidence of mitral stenosis. Moderate mitral annular calcification.  5. The aortic valve was not well visualized. Aortic valve regurgitation is not visualized. No aortic stenosis is present.  6. Aortic dilatation noted. There is mild dilatation of the ascending aorta, measuring 38 mm.  7. The inferior vena cava is normal in size with greater than 50% respiratory variability, suggesting right atrial pressure of 3 mmHg. FINDINGS  Left Ventricle: Left ventricular ejection fraction, by estimation, is 60 to 65%. The left  ventricle has normal function. The left ventricle has no regional wall motion abnormalities. The left ventricular internal cavity size was normal in size. There is  severe left ventricular hypertrophy. Left ventricular diastolic parameters are indeterminate. Right Ventricle: The right ventricular size is normal. No increase in right ventricular wall thickness. Right ventricular systolic function is normal. There is mildly elevated pulmonary artery systolic pressure. The tricuspid regurgitant velocity is 2.84  m/s, and with an assumed right atrial pressure of 8 mmHg, the estimated right ventricular systolic pressure is 14.4 mmHg. Left Atrium: Left atrial size was moderately dilated. Right Atrium: Right atrial size was normal in size. Pericardium: There is no evidence of pericardial effusion. Mitral Valve: The mitral valve is degenerative in appearance. There is mild thickening of the mitral valve leaflet(s). There is mild calcification of the mitral valve leaflet(s). Moderate mitral annular calcification. Mild mitral valve regurgitation. No evidence of mitral valve stenosis. Tricuspid Valve: The tricuspid valve is normal in structure. Tricuspid valve regurgitation is not demonstrated. No evidence of tricuspid stenosis. Aortic Valve: The aortic valve was not well visualized. Aortic valve regurgitation is not visualized. No aortic stenosis is present. Pulmonic Valve: The pulmonic valve was normal in structure. Pulmonic valve regurgitation is not visualized. No evidence of pulmonic stenosis. Aorta: The aortic root is normal in size and structure and aortic dilatation noted. There is mild dilatation of the ascending aorta, measuring 38 mm. Venous:  The inferior vena cava is normal in size with greater than 50% respiratory variability, suggesting right atrial pressure of 3 mmHg. IAS/Shunts: No atrial level shunt detected by color flow Doppler.  LEFT VENTRICLE PLAX 2D LVIDd:         3.80 cm     Diastology LVIDs:          2.20 cm     LV e' medial:    6.09 cm/s LV PW:         1.40 cm     LV E/e' medial:  14.3 LV IVS:        2.00 cm     LV e' lateral:   7.40 cm/s LVOT diam:     2.20 cm     LV E/e' lateral: 11.8 LV SV:         94 LV SV Index:   49 LVOT Area:     3.80 cm  LV Volumes (MOD) LV vol d, MOD A2C: 61.0 ml LV vol d, MOD A4C: 66.0 ml LV vol s, MOD A2C: 13.2 ml LV vol s, MOD A4C: 17.7 ml LV SV MOD A2C:     47.8 ml LV SV MOD A4C:     66.0 ml LV SV MOD BP:      48.5 ml RIGHT VENTRICLE            IVC RV S prime:     8.05 cm/s  IVC diam: 2.00 cm TAPSE (M-mode): 1.5 cm LEFT ATRIUM           Index       RIGHT ATRIUM           Index LA diam:      4.30 cm 2.22 cm/m  RA Area:     12.20 cm LA Vol (A2C): 47.9 ml 24.77 ml/m RA Volume:   23.00 ml  11.89 ml/m LA Vol (A4C): 35.3 ml 18.26 ml/m  AORTIC VALVE LVOT Vmax:   93.30 cm/s LVOT Vmean:  66.700 cm/s LVOT VTI:    0.248 m  AORTA Ao Root diam: 3.80 cm Ao Asc diam:  3.80 cm MITRAL VALVE               TRICUSPID VALVE MV Area (PHT): 3.50 cm    TR Peak grad:   32.3 mmHg MV Decel Time: 217 msec    TR Vmax:        284.00 cm/s MV E velocity: 86.95 cm/s MV A velocity: 84.85 cm/s  SHUNTS MV E/A ratio:  1.02        Systemic VTI:  0.25 m                            Systemic Diam: 2.20 cm Jenkins Rouge MD Electronically signed by Jenkins Rouge MD Signature Date/Time: 04/28/2020/9:50:00 AM    Final     PHYSICAL EXAM GENERAL: Awake, alert in NAD HEENT: - Normocephalic and atraumatic, moist mucous membranes.  LUNGS - Normal respiratory effort. SaO2 CV - Irregular rhthym, tachycardic ABDOMEN - Soft, nontender Ext: warm, well perfused Psych: Appropriate affect  NEURO:  Mental Status: Alert and oriented to person, place, time, and situation.  Speech/Language: speech is without dysarthria or aphasia. Naming, repetition, fluency, and comprehension intact. Cranial Nerves:  II: PERRL 77mm/brisk. visual fields at baseline, patient has deficit in the L upper field at baseline.  III, IV, VI: EOMI.  Lid elevation symmetric and full.  V: sensation is intact and  symmetrical to face. Moves jaw back and forth.  VII: Smile is symmetrical. Able to puff cheeks and raise eyebrows.  VIII:hearing intact to voice IX, X: Phonation normal.  XI: normal sternocleidomastoid and trapezius muscle strength MCN:OBSJGG is symmetrical without fasciculations.   Motor: Spontaneous movement in all extremities. Able to hold all extremities up against gravity.  Tone is normal. Bulk is normal.  Sensation- Intact to light touch bilaterally in all four extremities.  Coordination: FTN intact bilaterally. No pronator drift.  Gait- deferred  ASSESSMENT/PLAN Ms. Amber Kennedy is a 87 y.o. female with history of female with a PMHx of HTN, AF, gout, vision loss (she is not blind), and HLD. Patient had resolution of her symptoms prior to arrival in the ED and NIHSS was 0. Patient CT Head and MR Brain were negative for acute CVA. MRA head and neck noted a 1.34mm R ant communicating artery aneurysm as well as moderate to high grade stenosis of the proximal left internal carotid. CTA Neck confirmed 60% stenosis of proximal left internal carotid. Thus patient is at risk for TIA but no visualization of ischemia or hemorrhage.  At this time it is difficult to discern if patient is having failure of Xarelto, because patient is reporting that she has the same symptoms multiple times this would be less likely if patient's source for symptoms was embolic. However, patient is having difficulty with her current medication compliance and she is still presenting with symptoms concerning for TIA. At this time will discontinue Xarelto and start patient on Eliquis, which will not require food for absorption and at this time cannot truly rule out failure of Xarelto.  Right brain TIA - concerning for afib with failure of Xarelto  CT Head: No evidence of acute intracranial abnormality. Evidence of mild to moderate chronic microvascular ischemic  changes  MRI : no acute intracranial abnormalities  MRA Head and neck questionable moderate to high-grade stenosis of proximal left ICA  CTA neck confirmed 60% stenosis of proximal left ICA, by my review, likely 40% stenosis.  2D Echo: LVEF 60-65% w/ severe L ventricular hypertrophy  LDL 91  HgbA1c 5.7  Xarelto (rivaroxaban) daily prior to admission, after discussed with patient and family will switch to Eliquis (apixaban) daily 5mg  BID.   Disposition:  home  Atrial fibrillation, chronic  On Xarelto PTA  Still has A. fib RVR during encounter  Resume metoprolol for rate control  After discussion with patient and family, will switch to Eliquis.  Recurrent episodes of left hand numbness/weakness  Patient stated 4-5 episodes of left hand weakness numbness for the last 2 weeks.  Daughter also mentioned left shoulder pain for the last 2 weeks  Recurrent stereotactic episodes cannot be explained by cardioembolic source  Concerning for cervical radiculopathy  Recommend outpatient EMG/NCS with neurology follow-up  Carotid stenosis  MRA Head and neck questionable moderate to high-grade stenosis of proximal left ICA  CTA neck confirmed 60% stenosis of proximal left ICA, by my review, likely 40% stenosis.  Not related to current episode  Recommend outpt VVS follow up  Other Stroke Risk Factors  Advanced Age >/= 85   History of right CRAO - right eye seeing [facility] day # 1  Damita Dunnings, MD PGY-1  ATTENDING NOTE: I reviewed above note and agree with the assessment and plan. Pt was seen and examined.   87 year old female with history of hypertension, A. fib on Xarelto, HLD, right CRAO admitted for episode of slurred speech and left  facial droop.  Now resolved.  Patient also complaining of 4-5 episode of left hand weakness numbness for the last 2 weeks.  CT no acute abnormality.  MRI negative.  MRI concerning for left ICA bulb moderate to high-grade stenosis.   CTA neck confirmed left ICA bulb bulky soft plaque and stenosis, by my review, about 40%.  UDS negative.  EF 60 to 65%.  Creatinine 1.23-> 1.05.  LDL 91, A1c 5.7.  On exam, patient right eye only see hand waving, otherwise neurologically intact.  Patient episode concerning for right brain TIA with Xarelto failure.  After discussion with patient and family will switch to Eliquis for further stroke prevention.  Patient recurrent episode of left hand numbness weakness cannot be explained by vascular phenomena, concerning for cervical radiculopathy, recommend outpatient EMG/NCS with neurology follow-up.  Left carotid stenosis around 40% by my review, will need outpatient vascular surgery follow-up.  Continue A. fib RVR management per primary team.  For detailed assessment and plan, please refer to above as I have made changes wherever appropriate.   Neurology will sign off. Please call with questions. Pt will follow up with stroke clinic NP at [facility] in about 4 weeks. Thanks for the consult.   Rosalin Hawking, MD PhD Stroke Neurology 04/28/2020 4:16 PM   To contact Stroke Continuity provider, please refer to [email or web address] After hours, contact General Neurology

## 2020-04-28 NOTE — Evaluation (Signed)
Physical Therapy Evaluation Patient Details Name: Amber Kennedy MRN: [id number] DOB: 04/04/1933 Today's Date: 04/28/2020   History of Present Illness  Pt is an 87 y/o female who presents after experiencing slurred speech, L hand weakness, and facial droop per family report for 20-30 minutes. Symptoms improved by the time EMS arrived. Pt arrived to the ED 04/27/20 and MRI negative for acute stroke. PMH significant for vision loss, hyponatremia, hypokalemia, HTN, gout, a-fib.    Clinical Impression  Pt admitted with above diagnosis. Pt currently with functional limitations due to the deficits listed below (see PT Problem List). At the time of PT eval pt was able to perform transfers and ambulation with gross min guard assist. Occasional min assist provided for higher level balance activity and stair negotiation. Pt likely able to maneuver around her home with less unsteadiness as she has vision deficits but is familiar with that environment. Pt scored 17/25 on the DGI, indicating she is at a higher risk for falls. Will keep on PT caseload while acute to maximize functional independence and safety prior to return home. Recommending outpatient PT follow-up for higher level balance training to decrease risk for falls within her home environment.    Follow Up Recommendations Outpatient PT;Supervision for mobility/OOB    Equipment Recommendations  None recommended by PT    Recommendations for Other Services       Precautions / Restrictions Precautions Precautions: Fall Restrictions Weight Bearing Restrictions: No      Mobility  Bed Mobility Overal bed mobility: Modified Independent             General bed mobility comments: sit to supine without difficulty. HOB flat and no use of rails required.    Transfers Overall transfer level: Modified independent Equipment used: None             General transfer comment: No unsteadiness or LOB noted. Slow to  rise.  Ambulation/Gait Ambulation/Gait assistance: Min guard;Min assist Gait Distance (Feet): 400 Feet Assistive device: None Gait Pattern/deviations: Step-through pattern;Decreased stride length Gait velocity: Decreased Gait velocity interpretation: <1.8 ft/sec, indicate of risk for recurrent falls General Gait Details: Slow and mildly guarded. Occasional min assist during higher level balance activity assessed during gait training. See balance section for more detail.  Stairs Stairs: Yes Stairs assistance: Min guard Stair Management: Two rails;Step to pattern;Forwards Number of Stairs: 5 General stair comments: practice stairs in gym. Able to complete well with increased time and cues due to low vision  Wheelchair Mobility    Modified Rankin (Stroke Patients Only)       Balance Overall balance assessment: Needs assistance Sitting-balance support: Feet supported;No upper extremity supported Sitting balance-Leahy Scale: Fair     Standing balance support: No upper extremity supported;During functional activity Standing balance-Leahy Scale: Fair Standing balance comment: Grossly                 Standardized Balance Assessment Standardized Balance Assessment : Dynamic Gait Index   Dynamic Gait Index Level Surface: Mild Impairment Change in Gait Speed: Mild Impairment Gait with Horizontal Head Turns: Mild Impairment Gait with Vertical Head Turns: Normal Gait and Pivot Turn: Normal Step Over Obstacle: Mild Impairment Step Around Obstacles: Mild Impairment Steps: Moderate Impairment Total Score: 17       Pertinent Vitals/Pain Pain Assessment: No/denies pain    Home Living Family/patient expects to be discharged to:: Private residence Living Arrangements: Alone Available Help at Discharge: Family;Friend(s);Available PRN/intermittently Type of Home: House Home Access: Stairs to enter  Entrance Stairs-Number of Steps: "a few" Home Layout: Two level Home  Equipment: Walker - 2 wheels;Cane - single point;Bedside commode Additional Comments: has chair in shower for shampoo, chair next to tub with phone for safety    Prior Function Level of Independence: Independent         Comments: independent with ADLs, IADLs (not driving), Delivery for groceries     Hand Dominance        Extremity/Trunk Assessment   Upper Extremity Assessment Upper Extremity Assessment: Overall WFL for tasks assessed    Lower Extremity Assessment Lower Extremity Assessment: Overall WFL for tasks assessed    Cervical / Trunk Assessment Cervical / Trunk Assessment: Normal (Mildly kyphotic with forward head posture and rounded shoulders.)  Communication      Cognition Arousal/Alertness: Awake/alert Behavior During Therapy: WFL for tasks assessed/performed Overall Cognitive Status: No family/caregiver present to determine baseline cognitive functioning                                 General Comments: Likely baseline. Difficult to assess at times as pt somewhat tangential. Needed cues for direction finding in hallway but used the signs well.      General Comments      Exercises     Assessment/Plan    PT Assessment Patient needs continued PT services  PT Problem List Decreased balance;Decreased mobility;Decreased cognition;Decreased knowledge of use of DME;Decreased safety awareness;Pain;Decreased knowledge of precautions       PT Treatment Interventions DME instruction;Gait training;Stair training;Functional mobility training;Therapeutic activities;Therapeutic exercise;Neuromuscular re-education;Cognitive remediation;Patient/family education;Balance training    PT Goals (Current goals can be found in the Care Plan section)  Acute Rehab PT Goals Patient Stated Goal: Home ASAP PT Goal Formulation: With patient Time For Goal Achievement: 05/05/20 Potential to Achieve Goals: Good    Frequency Min 4X/week   Barriers to discharge         Co-evaluation               AM-PAC PT "6 Clicks" Mobility  Outcome Measure Help needed turning from your back to your side while in a flat bed without using bedrails?: None Help needed moving from lying on your back to sitting on the side of a flat bed without using bedrails?: None Help needed moving to and from a bed to a chair (including a wheelchair)?: None Help needed standing up from a chair using your arms (e.g., wheelchair or bedside chair)?: None Help needed to walk in hospital room?: A Little Help needed climbing 3-5 steps with a railing? : A Little 6 Click Score: 22    End of Session Equipment Utilized During Treatment: Gait belt Activity Tolerance: Patient tolerated treatment well Patient left: in bed;with call bell/phone within reach;with bed alarm set Nurse Communication: Mobility status PT Visit Diagnosis: Unsteadiness on feet (R26.81);Other symptoms and signs involving the nervous system (R29.898)    Time: 4166-0630 PT Time Calculation (min) (ACUTE ONLY): 20 min   Charges:   PT Evaluation $PT Eval Low Complexity: 1 Low          Rolinda Roan, PT, DPT Acute Rehabilitation Services Pager: [phone] Office: [phone]   Thelma Comp 04/28/2020, 12:51 PM

## 2020-04-28 NOTE — Consult Note (Signed)
CARDIOLOGY CONSULT NOTE  Patient ID: Amber Kennedy MRN: [id number] DOB/AGE: May 08, 1933 87 y.o.  Admit date: 04/27/2020 Attending physician: Nita Sells, Kennedy Primary Physician:  [name], Kennedy Outpatient Cardiologist: Dr. Vernell Leep Inpatient Cardiologist: Rex Kras, DO, [facility]  Chief complaint: Atrial fibrillation RVR, labile ventricular rate concern for tachybradycardia syndrome, and pauses  HPI:  Amber Kennedy is a 87 y.o. Caucasian female who presents with a chief complaint of " slurred speech, facial droop, left hand weakness." Her past medical history and cardiovascular risk factors include: TIA, paroxysmal atrial fibrillation, carotid stenosis, hypertension, hyperlipidemia, advanced age, postmenopausal female, former smoker.  Patient is accompanied by her daughter Amber Kennedy at bedside.  She was present during today's encounter.  Patient presents to the ER with symptoms of slurred speech along with left-sided facial droop and left hand weakness.  Symptoms lasted for 30 to 40 minutes with complete resolution.  Patient is currently being evaluated for transient ischemic attack by primary neurology team.  Around 530pm was notified to see the patient for management of paroxysmal atrial fibrillation with labile ventricular rate and concern for tachybradycardia syndrome.  Patient already received Toprol-XL at 10 AM, followed by Lopressor 12.5 mg at 1415 and also diltiazem 30 mg p.o. 3 times daily last dose given at 1622.  With plans of starting diltiazem drip to help control the ventricular rate.  And the plan was to see the patient tomorrow morning in consultation.  However, I was paged by the primary team at approximately 6:08 PM to have the patient seen today as she was having pauses on telemetry.  I was present at bedside by 6:18 PM to evaluate the patient and both her daughter and RN were present.   Patient denies any chest pain, shortness of breath, orthopnea, paroxysmal  nocturnal dyspnea, lower extremity swelling.  No focal neurological deficits on physical examination.  On bedside telemetry patient's ventricular rate ranges from 100-147 bpm and her systolic blood pressures are greater than 140 mmHg.  Underlying rhythm is atrial fibrillation and she has had 3 pauses since 1706 rhythm strips reviewed.  At 1706 patient had of 5.9-second pause while eating at at 1707 patient had a 3.3-second pause while eating.  At 1741 patient had a 7.5-second pause while she was on a commode bearing down.  When she got up she felt lightheaded and dizzy which shortly resolved without any near-syncope or syncopal events.  Hemodynamically patient remained stable.  ALLERGIES: Allergies  Allergen Reactions  . Allopurinol Other (See Comments)    "Made me feel badly, so I stopped taking it"  . Amlodipine Besylate Swelling and Other (See Comments)    Edema (legs)  . Penicillins Hives and Other (See Comments)    "The reaction happened a long time ago. I'm willing to take it now, if necessary."  . Albuterol Anxiety and Other (See Comments)    Made the patient jittery  . Latex Rash and Other (See Comments)    Allergic to Band-Aids  . Penicillin G Hives and Other (See Comments)    "The reaction happened a long time ago. I'm willing to take it now, if necessary."    PAST MEDICAL HISTORY: Past Medical History:  Diagnosis Date  . A-fib ([place])   . Gout   . Hypercholesterolemia   . Hypertension   . Hypokalemia   . Hyponatremia   . Vision loss     PAST SURGICAL HISTORY: Past Surgical History:  Procedure Laterality Date  . [place]  FAMILY HISTORY: The patient family history includes Sudden death in her father.   SOCIAL HISTORY:  The patient  reports that she quit smoking about 18 years ago. Her smoking use included cigarettes. She has a 17.50 pack-year smoking history. She has never used smokeless tobacco. She reports that she does not drink alcohol and  does not use drugs.  MEDICATIONS: Current Outpatient Medications  Medication Instructions  . furosemide (LASIX) 20 mg, Oral, As needed  . metoprolol succinate (TOPROL-XL) 100 mg, Oral, Daily  . Multiple Vitamins-Calcium (ONE-A-DAY WOMENS FORMULA) TABS 1 tablet, Oral, Daily with breakfast  . rivaroxaban (XARELTO) 20 mg, Oral, Daily with supper  . vitamin C (ASCORBIC ACID) 500 mg, Oral, Daily PRN    REVIEW OF SYSTEMS: Review of Systems  Constitutional: Negative for chills and fever.  HENT: Negative for hoarse voice and nosebleeds.   Eyes: Negative for discharge, double vision and pain.  Cardiovascular: Negative for chest pain, claudication, dyspnea on exertion, leg swelling, near-syncope, orthopnea, palpitations, paroxysmal nocturnal dyspnea and syncope.  Respiratory: Negative for hemoptysis and shortness of breath.   Musculoskeletal: Negative for muscle cramps and myalgias.  Gastrointestinal: Negative for abdominal pain, constipation, diarrhea, hematemesis, hematochezia, melena, nausea and vomiting.  Neurological: Positive for dizziness and light-headedness.  All other systems reviewed and are negative.  PHYSICAL EXAM: Vitals with BMI 04/28/2020 04/28/2020 04/28/2020  Height - - -  Weight - - -  BMI - - -  Systolic 063 016 010  Diastolic 932 92 355  Pulse 130 - -    Intake/Output Summary (Last 24 hours) at 04/28/2020 1840 Last data filed at 04/28/2020 0600 Gross per 24 hour  Intake 1152.96 ml  Output 250 ml  Net 902.96 ml    Net IO Since Admission: 902.96 mL [04/28/20 1840]  CONSTITUTIONAL: Well-developed and well-nourished. No acute distress.  SKIN: Skin is warm and dry. No rash noted. No cyanosis. No pallor. No jaundice HEAD: Normocephalic and atraumatic.  EYES: No scleral icterus MOUTH/THROAT: Moist oral membranes.  NECK: No JVD present. No thyromegaly noted. No carotid bruits  LYMPHATIC: No visible cervical adenopathy.  CHEST Normal respiratory effort. No intercostal  retractions  LUNGS: Clear to auscultation bilaterally.  No stridor. No wheezes. No rales.  CARDIOVASCULAR: Irregularly irregular, tachycardic, variable S1-S2, no murmurs rubs or gallops appreciated secondary to tachycardia. ABDOMINAL: No apparent ascites.  EXTREMITIES: No peripheral edema, warm to touch bilaterally. HEMATOLOGIC: No significant bruising NEUROLOGIC: Oriented to person, place, and time.  Cranial nerves II to XII are grossly intact.  Nonfocal. Normal muscle tone.  PSYCHIATRIC: Normal mood and affect. Normal behavior. Cooperative  RADIOLOGY: CT HEAD WO CONTRAST  Result Date: 04/27/2020 CLINICAL DATA:  Transient ischemic attack. Slurred speech and left-sided facial droop. EXAM: CT HEAD WITHOUT CONTRAST TECHNIQUE: Contiguous axial images were obtained from the base of the skull through the vertex without intravenous contrast. COMPARISON:  None. FINDINGS: Brain: No evidence of acute large vascular territory infarct. No acute hemorrhage. No mass lesion or abnormal mass effect. No hydrocephalus. Mild-to-moderate patchy white matter hypoattenuation, likely related to chronic microvascular ischemic disease. Mild to moderate generalized cerebral volume loss. Vascular: Calcific atherosclerosis. No hyperdense vessel identified. Skull: No acute fracture. Sinuses/Orbits: Mild paranasal sinus mucosal thickening. Unremarkable orbits. Other: No mastoid effusions. IMPRESSION: 1. No evidence of acute intracranial abnormality. 2. Mild to moderate chronic microvascular ischemic disease and generalized cerebral volume loss. Electronically Signed   By: Margaretha Sheffield Kennedy   On: 04/27/2020 15:44   CT ANGIO NECK W OR WO  CONTRAST  Result Date: 04/28/2020 CLINICAL DATA:  TIA.  Carotid stenosis. EXAM: CT ANGIOGRAPHY NECK TECHNIQUE: Multidetector CT imaging of the neck was performed using the standard protocol during bolus administration of intravenous contrast. Multiplanar CT image reconstructions and MIPs were  obtained to evaluate the vascular anatomy. Carotid stenosis measurements (when applicable) are obtained utilizing NASCET criteria, using the distal internal carotid diameter as the denominator. CONTRAST:  83mL OMNIPAQUE IOHEXOL 350 MG/ML SOLN COMPARISON:  MRA  neck 04/27/2020 FINDINGS: Aortic arch: Moderate atherosclerotic calcification aortic arch without aneurysm or dissection. Atherosclerotic disease without stenosis in the proximal great vessels. Right carotid system: Mild atherosclerotic disease proximal right internal carotid artery without significant stenosis. Tortuosity of the right internal carotid artery Left carotid system: Mild atherosclerotic disease left common carotid artery. Atherosclerotic disease left carotid bifurcation and proximal left internal carotid artery. Minimal luminal diameter of the internal carotid artery 1.8 mm, corresponding to 60% diameter stenosis. Tortuosity of the left internal carotid artery. Vertebral arteries: Both vertebral arteries patent to the basilar without stenosis. Skeleton: Disc degeneration and mild spurring C5-6 and C6-7. No acute skeletal abnormality. Other neck: Negative Upper chest: Lung apices clear bilaterally. IMPRESSION: 1. 60% diameter stenosis proximal left internal carotid artery due to atherosclerotic disease 2. Right carotid artery widely patent without stenosis. Both vertebral arteries widely patent. 3. Atherosclerotic aortic arch. Electronically Signed   By: Franchot Gallo M.D.   On: 04/28/2020 11:24   MR ANGIO HEAD WO CONTRAST  Result Date: 04/27/2020 CLINICAL DATA:  TIA. Episode of slurred speech and facial droop. Left hand weakness. Symptoms lasted 20 minutes. EXAM: MRI HEAD WITHOUT CONTRAST MRA HEAD WITHOUT CONTRAST MRA NECK WITHOUT CONTRAST TECHNIQUE: Multiplanar, multiecho pulse sequences of the brain and surrounding structures were obtained without intravenous contrast. Angiographic images of the Circle of Willis were obtained using MRA  technique without intravenous contrast. Angiographic images of the neck were obtained using MRA technique without intravenous contrast. Carotid stenosis measurements (when applicable) are obtained utilizing NASCET criteria, using the distal internal carotid diameter as the denominator. COMPARISON:  CT head without contrast 04/27/2020 FINDINGS: MRI HEAD FINDINGS Brain: Moderate generalized atrophy and white matter disease is present bilaterally. Asymmetric subcortical white matter changes are present in the right frontal operculum and corona radiata. The ventricles are proportionate to the degree of atrophy. No significant extraaxial fluid collection is present. Basal ganglia are within normal limits. The brainstem and cerebellum are within normal limits. The internal auditory canals are within normal limits. Vascular: Flow is present in the major intracranial arteries. Skull and upper cervical spine: The craniocervical junction is normal. Upper cervical spine is within normal limits. Marrow signal is unremarkable. Sinuses/Orbits: The paranasal sinuses and mastoid air cells are clear. The globes and orbits are within normal limits. MRA HEAD FINDINGS The internal carotid arteries are within normal limits from the high cervical segments through the ICA termini bilaterally. The A1 and M1 segments are normal. 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 MCA bifurcations are within normal limits. Moderate 10 UA shin of distal MCA branch vessels is present bilaterally without a significant proximal stenosis or occlusion. Vertebral arteries are codominant. Right PICA origin visualized and normal. Left AICA is dominant. Mild narrowing is present in the distal vertebral artery at the vertebrobasilar junction. The basilar artery is normal. Left posterior cerebral artery originates from basilar tip. The right posterior cerebral artery is of fetal type. Attenuation PCA branch vessels is noted bilaterally  MRA NECK FINDINGS Time-of-flight images  demonstrate a 3 vessel arch configuration. Significant signal loss is noted at the proximal left internal carotid artery with decreased more distal signal. There is tortuosity of the internal carotid arteries bilaterally. Flow is antegrade in the vertebral arteries bilaterally. IMPRESSION: 1. No acute intracranial abnormality. 2. Moderate generalized atrophy and white matter disease likely reflects the sequela of chronic microvascular ischemia. 3. No significant proximal stenosis, aneurysm, or branch vessel occlusion within the Circle of Willis. 4. 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 5. Moderate distal small vessel disease in the anterior and posterior circulation without other significant proximal stenosis, aneurysm, or branch vessel occlusion. 6. Moderate to high-grade stenosis of the proximal left internal carotid artery suggest on the time-of-flight images. This could be confirmed with carotid Doppler study or CTA. It is contralateral to the symptomatic side. Electronically Signed   By: San Morelle M.D.   On: 04/27/2020 19:49   MR ANGIO NECK WO CONTRAST  Result Date: 04/27/2020 CLINICAL DATA:  TIA. Episode of slurred speech and facial droop. Left hand weakness. Symptoms lasted 20 minutes. EXAM: MRI HEAD WITHOUT CONTRAST MRA HEAD WITHOUT CONTRAST MRA NECK WITHOUT CONTRAST TECHNIQUE: Multiplanar, multiecho pulse sequences of the brain and surrounding structures were obtained without intravenous contrast. Angiographic images of the Circle of Willis were obtained using MRA technique without intravenous contrast. Angiographic images of the neck were obtained using MRA technique without intravenous contrast. Carotid stenosis measurements (when applicable) are obtained utilizing NASCET criteria, using the distal internal carotid diameter as the denominator. COMPARISON:  CT head without contrast 04/27/2020 FINDINGS: MRI HEAD FINDINGS Brain: Moderate  generalized atrophy and white matter disease is present bilaterally. Asymmetric subcortical white matter changes are present in the right frontal operculum and corona radiata. The ventricles are proportionate to the degree of atrophy. No significant extraaxial fluid collection is present. Basal ganglia are within normal limits. The brainstem and cerebellum are within normal limits. The internal auditory canals are within normal limits. Vascular: Flow is present in the major intracranial arteries. Skull and upper cervical spine: The craniocervical junction is normal. Upper cervical spine is within normal limits. Marrow signal is unremarkable. Sinuses/Orbits: The paranasal sinuses and mastoid air cells are clear. The globes and orbits are within normal limits. MRA HEAD FINDINGS The internal carotid arteries are within normal limits from the high cervical segments through the ICA termini bilaterally. The A1 and M1 segments are normal. 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 MCA bifurcations are within normal limits. Moderate 10 UA shin of distal MCA branch vessels is present bilaterally without a significant proximal stenosis or occlusion. Vertebral arteries are codominant. Right PICA origin visualized and normal. Left AICA is dominant. Mild narrowing is present in the distal vertebral artery at the vertebrobasilar junction. The basilar artery is normal. Left posterior cerebral artery originates from basilar tip. The right posterior cerebral artery is of fetal type. Attenuation PCA branch vessels is noted bilaterally MRA NECK FINDINGS Time-of-flight images demonstrate a 3 vessel arch configuration. Significant signal loss is noted at the proximal left internal carotid artery with decreased more distal signal. There is tortuosity of the internal carotid arteries bilaterally. Flow is antegrade in the vertebral arteries bilaterally. IMPRESSION: 1. No acute intracranial abnormality. 2. Moderate  generalized atrophy and white matter disease likely reflects the sequela of chronic microvascular ischemia. 3. No significant proximal stenosis, aneurysm, or branch vessel occlusion within the Circle of Willis. 4. 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 5. Moderate distal small vessel  disease in the anterior and posterior circulation without other significant proximal stenosis, aneurysm, or branch vessel occlusion. 6. Moderate to high-grade stenosis of the proximal left internal carotid artery suggest on the time-of-flight images. This could be confirmed with carotid Doppler study or CTA. It is contralateral to the symptomatic side. Electronically Signed   By: San Morelle M.D.   On: 04/27/2020 19:49   MR BRAIN WO CONTRAST  Result Date: 04/27/2020 CLINICAL DATA:  TIA. Episode of slurred speech and facial droop. Left hand weakness. Symptoms lasted 20 minutes. EXAM: MRI HEAD WITHOUT CONTRAST MRA HEAD WITHOUT CONTRAST MRA NECK WITHOUT CONTRAST TECHNIQUE: Multiplanar, multiecho pulse sequences of the brain and surrounding structures were obtained without intravenous contrast. Angiographic images of the Circle of Willis were obtained using MRA technique without intravenous contrast. Angiographic images of the neck were obtained using MRA technique without intravenous contrast. Carotid stenosis measurements (when applicable) are obtained utilizing NASCET criteria, using the distal internal carotid diameter as the denominator. COMPARISON:  CT head without contrast 04/27/2020 FINDINGS: MRI HEAD FINDINGS Brain: Moderate generalized atrophy and white matter disease is present bilaterally. Asymmetric subcortical white matter changes are present in the right frontal operculum and corona radiata. The ventricles are proportionate to the degree of atrophy. No significant extraaxial fluid collection is present. Basal ganglia are within normal limits. The brainstem and cerebellum are within normal limits. The  internal auditory canals are within normal limits. Vascular: Flow is present in the major intracranial arteries. Skull and upper cervical spine: The craniocervical junction is normal. Upper cervical spine is within normal limits. Marrow signal is unremarkable. Sinuses/Orbits: The paranasal sinuses and mastoid air cells are clear. The globes and orbits are within normal limits. MRA HEAD FINDINGS The internal carotid arteries are within normal limits from the high cervical segments through the ICA termini bilaterally. The A1 and M1 segments are normal. 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 MCA bifurcations are within normal limits. Moderate 10 UA shin of distal MCA branch vessels is present bilaterally without a significant proximal stenosis or occlusion. Vertebral arteries are codominant. Right PICA origin visualized and normal. Left AICA is dominant. Mild narrowing is present in the distal vertebral artery at the vertebrobasilar junction. The basilar artery is normal. Left posterior cerebral artery originates from basilar tip. The right posterior cerebral artery is of fetal type. Attenuation PCA branch vessels is noted bilaterally MRA NECK FINDINGS Time-of-flight images demonstrate a 3 vessel arch configuration. Significant signal loss is noted at the proximal left internal carotid artery with decreased more distal signal. There is tortuosity of the internal carotid arteries bilaterally. Flow is antegrade in the vertebral arteries bilaterally. IMPRESSION: 1. No acute intracranial abnormality. 2. Moderate generalized atrophy and white matter disease likely reflects the sequela of chronic microvascular ischemia. 3. No significant proximal stenosis, aneurysm, or branch vessel occlusion within the Circle of Willis. 4. 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 5. Moderate distal small vessel disease in the anterior and posterior circulation without other significant proximal  stenosis, aneurysm, or branch vessel occlusion. 6. Moderate to high-grade stenosis of the proximal left internal carotid artery suggest on the time-of-flight images. This could be confirmed with carotid Doppler study or CTA. It is contralateral to the symptomatic side. Electronically Signed   By: San Morelle M.D.   On: 04/27/2020 19:49   ECHOCARDIOGRAM COMPLETE  Result Date: 04/28/2020    ECHOCARDIOGRAM REPORT   Patient Name:   Amber Kennedy Date of Exam: 04/28/2020  Medical Rec #:  [id number]     Height:       67.0 in Accession #:    [id number]    Weight:       180.0 lb Date of Birth:  01/22/1934     BSA:          1.934 m Patient Age:    36 years      BP:           156/50 mmHg Patient Gender: F             HR:           54 bpm. Exam Location:  Inpatient Procedure: 2D Echo, 3D Echo, Cardiac Doppler and Color Doppler Indications:    TIA  History:        Patient has prior history of Echocardiogram examinations, most                 recent 02/06/2017. CHF, Abnormal ECG and Prior CABG, TIA,                 Arrythmias:Atrial Fibrillation; Risk Factors:Diabetes. Edema.  Sonographer:    Roseanna Rainbow RDCS Referring Phys: [id number] [place]  Sonographer Comments: Suboptimal parasternal window. IMPRESSIONS  1. Left ventricular ejection fraction, by estimation, is 60 to 65%. The left ventricle has normal function. The left ventricle has no regional wall motion abnormalities. There is severe left ventricular hypertrophy. Left ventricular diastolic parameters  are indeterminate.  2. Right ventricular systolic function is normal. The right ventricular size is normal. There is mildly elevated pulmonary artery systolic pressure.  3. Left atrial size was moderately dilated.  4. The mitral valve is degenerative. Mild mitral valve regurgitation. No evidence of mitral stenosis. Moderate mitral annular calcification.  5. The aortic valve was not well visualized. Aortic valve regurgitation is not visualized. No aortic stenosis  is present.  6. Aortic dilatation noted. There is mild dilatation of the ascending aorta, measuring 38 mm.  7. The inferior vena cava is normal in size with greater than 50% respiratory variability, suggesting right atrial pressure of 3 mmHg. FINDINGS  Left Ventricle: Left ventricular ejection fraction, by estimation, is 60 to 65%. The left ventricle has normal function. The left ventricle has no regional wall motion abnormalities. The left ventricular internal cavity size was normal in size. There is  severe left ventricular hypertrophy. Left ventricular diastolic parameters are indeterminate. Right Ventricle: The right ventricular size is normal. No increase in right ventricular wall thickness. Right ventricular systolic function is normal. There is mildly elevated pulmonary artery systolic pressure. The tricuspid regurgitant velocity is 2.84  m/s, and with an assumed right atrial pressure of 8 mmHg, the estimated right ventricular systolic pressure is 63.8 mmHg. Left Atrium: Left atrial size was moderately dilated. Right Atrium: Right atrial size was normal in size. Pericardium: There is no evidence of pericardial effusion. Mitral Valve: The mitral valve is degenerative in appearance. There is mild thickening of the mitral valve leaflet(s). There is mild calcification of the mitral valve leaflet(s). Moderate mitral annular calcification. Mild mitral valve regurgitation. No evidence of mitral valve stenosis. Tricuspid Valve: The tricuspid valve is normal in structure. Tricuspid valve regurgitation is not demonstrated. No evidence of tricuspid stenosis. Aortic Valve: The aortic valve was not well visualized. Aortic valve regurgitation is not visualized. No aortic stenosis is present. Pulmonic Valve: The pulmonic valve was normal in structure. Pulmonic valve regurgitation is not visualized. No evidence of pulmonic stenosis. Aorta: The aortic  root is normal in size and structure and aortic dilatation noted. There is  mild dilatation of the ascending aorta, measuring 38 mm. Venous: The inferior vena cava is normal in size with greater than 50% respiratory variability, suggesting right atrial pressure of 3 mmHg. IAS/Shunts: No atrial level shunt detected by color flow Doppler.  LEFT VENTRICLE PLAX 2D LVIDd:         3.80 cm     Diastology LVIDs:         2.20 cm     LV e' medial:    6.09 cm/s LV PW:         1.40 cm     LV E/e' medial:  14.3 LV IVS:        2.00 cm     LV e' lateral:   7.40 cm/s LVOT diam:     2.20 cm     LV E/e' lateral: 11.8 LV SV:         94 LV SV Index:   49 LVOT Area:     3.80 cm  LV Volumes (MOD) LV vol d, MOD A2C: 61.0 ml LV vol d, MOD A4C: 66.0 ml LV vol s, MOD A2C: 13.2 ml LV vol s, MOD A4C: 17.7 ml LV SV MOD A2C:     47.8 ml LV SV MOD A4C:     66.0 ml LV SV MOD BP:      48.5 ml RIGHT VENTRICLE            IVC RV S prime:     8.05 cm/s  IVC diam: 2.00 cm TAPSE (M-mode): 1.5 cm LEFT ATRIUM           Index       RIGHT ATRIUM           Index LA diam:      4.30 cm 2.22 cm/m  RA Area:     12.20 cm LA Vol (A2C): 47.9 ml 24.77 ml/m RA Volume:   23.00 ml  11.89 ml/m LA Vol (A4C): 35.3 ml 18.26 ml/m  AORTIC VALVE LVOT Vmax:   93.30 cm/s LVOT Vmean:  66.700 cm/s LVOT VTI:    0.248 m  AORTA Ao Root diam: 3.80 cm Ao Asc diam:  3.80 cm MITRAL VALVE               TRICUSPID VALVE MV Area (PHT): 3.50 cm    TR Peak grad:   32.3 mmHg MV Decel Time: 217 msec    TR Vmax:        284.00 cm/s MV E velocity: 86.95 cm/s MV A velocity: 84.85 cm/s  SHUNTS MV E/A ratio:  1.02        Systemic VTI:  0.25 m                            Systemic Diam: 2.20 cm Amber Kennedy Electronically signed by Amber Kennedy Signature Date/Time: 04/28/2020/9:50:00 AM    Final     LABORATORY DATA: Lab Results  Component Value Date   WBC 9.2 04/27/2020   HGB 14.6 04/27/2020   HCT 44.8 04/27/2020   MCV 93.7 04/27/2020   PLT 272 04/27/2020    Recent Labs  Lab 04/28/20 0934  NA 138  K 3.9  CL 106  CO2 25  BUN 22  CREATININE 1.05*   CALCIUM 9.1  PROT 6.0*  BILITOT 0.6  ALKPHOS 51  ALT 15  AST 19  GLUCOSE 100*    Lipid Panel     Component Value Date/Time   CHOL 152 04/28/2020 0934   TRIG 143 04/28/2020 0934   HDL 32 (L) 04/28/2020 0934   CHOLHDL 4.8 04/28/2020 0934   VLDL 29 04/28/2020 0934   LDLCALC 91 04/28/2020 0934    BNP (last 3 results) No results for input(s): BNP in the last 8760 hours.  HEMOGLOBIN A1C Lab Results  Component Value Date   HGBA1C 5.7 (H) 04/28/2020   MPG 116.89 04/28/2020    Cardiac Panel (last 3 results) No results for input(s): CKTOTAL, CKMB, RELINDX in the last 8760 hours.  Invalid input(s): TROPONINHS  No results found for: CKTOTAL, CKMB, CKMBINDEX   TSH Recent Labs    04/27/20 2259  TSH 3.836      CARDIAC DATABASE: EKG: 04/27/2020: Sinus bradycardia with sinus arrhythmia, 43 bpm, T wave inversions in the high lateral lateral leads consider lateral ischemia, without underlying injury pattern.  Echocardiogram: 04/28/2020: 1. Left ventricular ejection fraction, by estimation, is 60 to 65%. The  left ventricle has normal function. The left ventricle has no regional  wall motion abnormalities. There is severe left ventricular hypertrophy.  Left ventricular diastolic parameters  are indeterminate.  2. Right ventricular systolic function is normal. The right ventricular  size is normal. There is mildly elevated pulmonary artery systolic  pressure.  3. Left atrial size was moderately dilated.  4. The mitral valve is degenerative. Mild mitral valve regurgitation. No  evidence of mitral stenosis. Moderate mitral annular calcification.  5. The aortic valve was not well visualized. Aortic valve regurgitation  is not visualized. No aortic stenosis is present.  6. Aortic dilatation noted. There is mild dilatation of the ascending  aorta, measuring 38 mm.  7. The inferior vena cava is normal in size with greater than 50%  respiratory variability, suggesting  right atrial pressure of 3 mmHg.   IMPRESSION & RECOMMENDATIONS: Amber Kennedy is a 87 y.o. Caucasian female whose past medical history and cardiovascular risk factors include: TIA, paroxysmal atrial fibrillation, carotid stenosis, hypertension, hyperlipidemia, advanced age, postmenopausal female, former smoker.  Primary Diagnosis: Atrial fibrillation with rapid ventricular rate Pauses Transient ischemic attack Left ICA stenosis Hypertension. Hyperlipidemia.  Atrial fibrillation with rapid ventricular rate: Patient was in sinus bradycardia when she presented to the hospital and has been in atrial fibrillation with rapid ventricular rate since later this evening.  Hemodynamically patient is stable with a systolic blood pressures consistently greater than 140 mmHg.  Her ventricular rate is labile with a heart rate ranging between 100-147 bpm on bedside tele.  She has received her Toprol-XL 100 mg p.o. daily, Toprol-XL 12.5 mg x 1 given earlier today, Cardizem 30 mg p.o. x1 also given without any significant improvement in her ventricular rate.  Shared decision was to start Cardizem drip as her ventricular rate was greater than 110 bpm and her LVEF was preserved.  I was asked to see the patient urgently as she was having pauses on telemetry.  She has had total of 3 pauses this evening. Two occurred while she was eating (5.9-second pause at 1706 and 3.3-second pauses 1707) and the longest pause was 7.5 seconds while she was on a commode bearing down at 1741.   Since the patient's underlying rhythm is atrial fibrillation pauses up to 5 seconds are often seen as long as the patient is asymptomatic no additional interventions needed.  The pause that was 7.5 seconds occurred while she was on the commode  bearing down which is most likely vagal mediated as well.  Patient did not have a syncopal event during this time.  Hemodynamically patient remains stable.  Would recommend a low-dose Cardizem drip to  help improve her ventricular rate with avoiding hypotension given her recent TIA.  Blood pressure recommendations per neurology.  Will focus on rate control strategy for now.  Agree with transitioning her from Xarelto to Eliquis as recommended by neurology.  CHA2DS2-VASc SCORE is 6 (htn, age greater than 75, TIA, female gender) which correlates to 9.8 % risk of stroke per year.  Most recent labs reviewed. Agree with checking a TSH and magnesium level.  Check EKG   If the patient continues to have prolonged pauses greater than 5 seconds recommend placing transcutaneous pads as a precautionary measure.  Mobility with assistance recommended, and conveyed to nursing staff.  Continue telemetry  May consider an ischemic evaluation once discharged given her T wave inversions in the high lateral lateral leads.  Currently she does not have any chest pain or anginal equivalent.  This can be followed up as outpatient.  We will continue to follow the patient with you during this hospitalization.  TIA: Currently managed by neurology.  Recommendations appreciated and reviewed.  Hyperlipidemia: LDL greater than 70 mg/dL.  Recommend statin therapy from a cardiovascular standpoint given the recent TIA and ICA stenosis.  CRITICAL CARE Performed by: Rex Kras   Total critical care time: 62 minutes   Critical care time was exclusive of separately billable procedures and treating other patients.   Critical care was necessary to treat or prevent imminent or life-threatening deterioration.   Critical care was time spent personally by me on the following activities: development of treatment plan with patient, primary team, nursing, discussions with primary team, evaluation of patient's response to treatment, examination of patient, obtaining history from patient or surrogate, ordering and performing treatments and interventions, ordering and review of laboratory studies, ordering and review of  radiographic studies, pulse oximetry and re-evaluation of patient's condition.  Patient's and her daughter's questions and concerns were addressed to her satisfaction. She and her daughter voices understanding of the instructions provided during this encounter.   This note was created using a voice recognition software as a result there may be grammatical errors inadvertently enclosed that do not reflect the nature of this encounter. Every attempt is made to correct such errors.  Mechele Claude [facility]  Pager: [phone] Office: [phone] 04/28/2020, 6:40 PM

## 2020-04-28 NOTE — TOC Initial Note (Signed)
Transition of Care [facility]) - Initial/Assessment Note    Patient Details  Name: Amber Kennedy MRN: [id number] Date of Birth: [birth year]  [facility] CM/SW Contact:    Marilu Favre, RN Phone Number: 04/28/2020, 3:48 PM  Clinical Narrative:                 PT recommending OP PT. Discussed with patient at bedside. Patient declining at this time. She is from home alone however states she has a very supportive family. Her grand daughter Benjamine Mola who is a PT and works here.    Expected Discharge Plan: Home/Self Care     Patient Goals and CMS Choice Patient states their goals for this hospitalization and ongoing recovery are:: to return to home CMS Medicare.gov Compare Post Acute Care list provided to:: Patient Choice offered to / list presented to : Patient  Expected Discharge Plan and Services Expected Discharge Plan: Home/Self Care     Post Acute Care Choice: [place] arrangements for the past 2 months: Single Family Home                   DME Agency: NA       HH Arranged: NA          Prior Living Arrangements/Services Living arrangements for the past 2 months: Single Family Home Lives with:: Self Patient language and need for interpreter reviewed:: Yes Do you feel safe going back to the place where you live?: Yes      Need for Family Participation in Patient Care: Yes (Comment) Care giver support system in place?: Yes (comment)   Criminal Activity/Legal Involvement Pertinent to Current Situation/Hospitalization: No - Comment as needed  Activities of Daily Living      Permission Sought/Granted   Permission granted to share information with : No              Emotional Assessment Appearance:: Appears stated age Attitude/Demeanor/Rapport: Engaged Affect (typically observed): Accepting Orientation: : Oriented to Self,Oriented to Place,Oriented to  Time,Oriented to Situation Alcohol / Substance Use: Not Applicable Psych  Involvement: No (comment)  Admission diagnosis:  TIA (transient ischemic attack) [G45.9] Patient Active Problem List   Diagnosis Date Noted  . TIA (transient ischemic attack) 04/27/2020  . Bradycardia 04/27/2020  . Leg edema 09/20/2018  . Paroxysmal atrial fibrillation ([place]) 09/20/2018  . Atrial fibrillation with RVR ([place]) 02/05/2017  . Essential hypertension 02/05/2017  . Hyponatremia 02/05/2017  . Hypokalemia 02/05/2017   PCP:  [name], MD Pharmacy:   [facility] [address] [place] [address] Phone: [phone] Fax: [phone]     Social Determinants of Health (SDOH) Interventions    Readmission Risk Interventions No flowsheet data found.

## 2020-04-28 NOTE — Hospital Course (Addendum)
   Heart rate 50s  Data BUNs/creatinine 20/1.2-not repeated WBC 9.2 Hemoglobin 14 Platelet 272

## 2020-04-28 NOTE — Progress Notes (Signed)
Pt was found to have a 3 and 5.9 second pause around 1700. She was sitting at the edge of the bed eating her dinner. Pt was asymptomatoc, VS as per flow. Apprx 40 minutes later, while straining on the toilet seat, she had another 7+ seconds pause. Pt symptomatic, dizzy, faint feeling with a warm "rushing sensation" up her back. Attending aware, Cardiology consulted and bedside. Pt on a diltiazem gtt. Family bedside. Call bell placed within reach. Will continue to monitor and maintain safety.

## 2020-04-28 NOTE — Progress Notes (Signed)
Cardiology:  Patient presents w/ TIA symptoms and has underlying PAFib current on BB and Xarelto. 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   Review of the EMR notes that the documented HR ranges between 50-95bpm and SBP 142-183mmHG since 04/04 0157 to 1536.  Patient has received Toprol XL 100mg  po qday at 10am, Lopressor 12.5mg  po x 1 at 1415, and on Cardizem 30mg  po tid (last dose given at 1622).   Echo notes preserved LVEF.   Temp:  [97.3 F (36.3 C)-98 F (36.7 C)] 97.7 F (36.5 C) (04/04 1536) Pulse Rate:  [42-95] 95 (04/04 1536) Resp:  [12-18] 17 (04/04 1536) BP: (132-180)/(50-100) 142/100 (04/04 1536) SpO2:  [98 %-100 %] 99 % (04/04 1536) Weight:  [84 kg] 84 kg (04/04 1400)  Continue oral AV nodal blocking agents as ordered and goal HR of <110bpm.   If needed consider Cardizem gtt for rate control strategy but would avoid rapid drops in SBP give her recent TIA.   Blood pressure goal as per neurology recommendations given her recent TIA.   Will see the patient in consult in the morning.   Please call if any questions or concerns arise. Spoke to primary team w/ recommendations.   Rex Kras, [place], [facility]  Pager: [phone] Office: [phone]

## 2020-04-28 NOTE — Progress Notes (Signed)
OT Cancellation Note  Patient Details Name: Amber Kennedy MRN: [id number] DOB: Mar 28, 1933   Cancelled Treatment:    Reason Eval/Treat Not Completed: Patient at procedure or test/ unavailable, pt with ECHO.  Will follow and see as able.   Jolaine Artist, OT Acute Rehabilitation Services Pager [phone] Office [place] 04/28/2020, 9:07 AM

## 2020-04-28 NOTE — Evaluation (Signed)
Occupational Therapy Evaluation Patient Details Name: Amber Kennedy MRN: [id number] DOB: Apr 13, 1933 Today's Date: 04/28/2020    History of Present Illness Pt is an 87 y/o female who presents after experiencing slurred speech, L hand weakness, and facial droop per family report for 20-30 minutes. Symptoms improved by the time EMS arrived. Pt arrived to the ED 04/27/20 and MRI negative for acute stroke. PMH significant for vision loss, hyponatremia, hypokalemia, HTN, gout, a-fib.   Clinical Impression   PTA patient independent with ADLs, mobility.  Admitted for above and presenting near baseline modified independent level for ADLs and functional in room mobility.  She has baseline visual deficits requiring supervision at times for safety, but anticipate she will function well in her home environment.  She reports having good support at home from family and friends as needed.  No deficits seen in strength, sensation or cognition.  Based on performance today, no further OT needs have been identified and OT will sign off.      Follow Up Recommendations  No OT follow up;Supervision - Intermittent    Equipment Recommendations  None recommended by OT    Recommendations for Other Services       Precautions / Restrictions Precautions Precautions: Fall Restrictions Weight Bearing Restrictions: No      Mobility Bed Mobility Overal bed mobility: Modified Independent             General bed mobility comments: sit to supine without difficulty. HOB flat and no use of rails required.    Transfers Overall transfer level: Modified independent Equipment used: None             General transfer comment: No unsteadiness or LOB noted. Slow to rise.    Balance Overall balance assessment: Needs assistance Sitting-balance support: No upper extremity supported;Feet supported Sitting balance-Leahy Scale: Good     Standing balance support: No upper extremity supported;During functional  activity Standing balance-Leahy Scale: Fair Standing balance comment: Grossly                 Standardized Balance Assessment Standardized Balance Assessment : Dynamic Gait Index   Dynamic Gait Index Level Surface: Mild Impairment Change in Gait Speed: Mild Impairment Gait with Horizontal Head Turns: Mild Impairment Gait with Vertical Head Turns: Normal Gait and Pivot Turn: Normal Step Over Obstacle: Mild Impairment Step Around Obstacles: Mild Impairment Steps: Moderate Impairment Total Score: 17     ADL either performed or assessed with clinical judgement   ADL Overall ADL's : Modified independent                                       General ADL Comments: completing ADLs in room without assist     Vision Baseline Vision/History:  (hx of visual deficits for 25 years, reports "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 but 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 be") Patient Visual Report: No change from baseline Additional Comments: appears baseline     Perception     Praxis      Pertinent Vitals/Pain Pain Assessment: No/denies pain     Hand Dominance Right   Extremity/Trunk Assessment Upper Extremity Assessment Upper Extremity Assessment: Overall WFL for tasks assessed   Lower Extremity Assessment Lower Extremity Assessment: Defer to PT evaluation   Cervical / Trunk Assessment Cervical / Trunk Assessment: Normal (Mildly kyphotic with forward head posture and rounded shoulders.)   Communication Communication Communication: No difficulties   Cognition Arousal/Alertness: Awake/alert Behavior During  Therapy: WFL for tasks assessed/performed Overall Cognitive Status: No family/caregiver present to determine baseline cognitive functioning                                 General Comments: Likely baseline. Difficult to assess at times as pt somewhat tangential.   General Comments       Exercises     Shoulder Instructions      Home Living Family/patient expects to be  discharged to:: Private residence Living Arrangements: Alone Available Help at Discharge: Family;Friend(s);Available PRN/intermittently Type of Home: House Home Access: Stairs to enter [employer] of Steps: "a few"   Home Layout: Two level Alternate Level Stairs-Number of Steps: flight   Bathroom Shower/Tub: Teacher, early years/pre: Standard     Home Equipment: Environmental consultant - 2 wheels;Cane - single point;Bedside commode   Additional Comments: has chair in shower for shampoo, chair next to tub with phone for safety      Prior Functioning/Environment Level of Independence: Independent        Comments: independent with ADLs, IADLs (not driving), Delivery for groceries        OT Problem List:        OT Treatment/Interventions:      OT Goals(Current goals can be found in the care plan section) Acute Rehab OT Goals Patient Stated Goal: Home ASAP OT Goal Formulation: With patient  OT Frequency:     Barriers to D/C:            Co-evaluation              AM-PAC OT "6 Clicks" Daily Activity     Outcome Measure Help from another person eating meals?: None Help from another person taking care of personal grooming?: None Help from another person toileting, which includes using toliet, bedpan, or urinal?: None Help from another person bathing (including washing, rinsing, drying)?: None Help from another person to put on and taking off regular upper body clothing?: None Help from another person to put on and taking off regular lower body clothing?: None 6 Click Score: 24   End of Session Nurse Communication: Mobility status  Activity Tolerance: Patient tolerated treatment well Patient left: with call bell/phone within reach;Other (comment) (seated EOB with PT)  OT Visit Diagnosis: Unsteadiness on feet (R26.81)                Time: 1749-4496 OT Time Calculation (min): 22 min Charges:  OT General Charges $OT Visit: 1 Visit OT Evaluation $OT Eval  Low Complexity: 1 Low  Jolaine Artist, OT Acute Rehabilitation Services Pager [phone] Office [place] 04/28/2020, 1:19 PM

## 2020-04-28 NOTE — Progress Notes (Signed)
  Echocardiogram 2D Echocardiogram has been performed.  Amber Kennedy 04/28/2020, 9:34 AM

## 2020-04-28 NOTE — Progress Notes (Signed)
PROGRESS NOTE   Amber Kennedy  YTK:[id number] DOB: [birth year] DOA: 04/27/2020 PCP: [name], MD  Brief Narrative:  43 [place] A. fib (Dr. Virgina Jock [place])/chad >4/Xarelto diagnosed 01/2017 HFpEF Echo grade II DD 60-65% PAP 35 mm Poor vision (not blind)-Per neurology?  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 fib with probable tachybradycardia syndrome CHADS2 score >4 Transitioning to likely Eliquis-Case management consulted to ensure can afford Resumed Toprol-XL 100 Adding 12.5 metoprolol at this time as rate not controlled-given Cardizem 31 time Discussed with Dr. Maurine Simmering Cardizem gtt. Check magnesium HF PEF grade 2 DD with pulmonary HTN Seems compensated at this time Continue IV fluid at this time to maintain pressure given TIA/stroke admitted for 24 hours Lasix 20 as needed from home has been held Prior ocular CVAs? Outpatient neuro-ophthalmology evaluation Hypothyroid? Check TSH with magnesium now   DVT prophylaxis: Eliquis Code Status: DNR confirmed Family Communication: Discussed with daughter and daughter-in-law at the bedside when I saw her Disposition:  Status is: Observation  The patient will require care spanning > 2 midnights and should be moved to  inpatient because: Ongoing active pain requiring inpatient pain management, Ongoing diagnostic testing needed not appropriate for outpatient work up and IV treatments appropriate due to intensity of illness or inability to take PO  Dispo: The patient is from: Home              Anticipated d/c is to: Home              Patient currently is not medically stable to d/c.   Difficult to place patient No       Consultants:   Cardiologist Dr. Geanie Logan  Neurology  Procedures:  CTA neck 4/4 = IMPRESSION: 1. 60% diameter stenosis proximal left internal carotid artery due to atherosclerotic disease 2. Right carotid artery widely patent without stenosis. Both vertebral arteries widely patent. 3. Atherosclerotic aortic arch.   Echo 4/4 = IMPRESSIONS    1. Left ventricular ejection fraction, by estimation, is 60 to 65%. The  left ventricle has normal function. The left ventricle has no regional  wall motion abnormalities. There is severe left ventricular hypertrophy.  Left ventricular diastolic parameters  are indeterminate.  2. Right ventricular systolic function is normal. The right ventricular  size is normal. There is mildly elevated pulmonary artery systolic  pressure.  3. Left atrial size was moderately dilated.  4. The mitral valve is degenerative. Mild mitral valve regurgitation. No  evidence of mitral stenosis. Moderate mitral annular calcification.  5. The aortic valve was not well visualized. Aortic valve regurgitation  is not visualized. No aortic stenosis is present.  6. Aortic dilatation noted. There is mild dilatation of the ascending  aorta, measuring 38 mm.  7. The inferior vena cava is normal in size with greater than 50%  respiratory variability, suggesting right atrial pressure of 3 mmHg.   Antimicrobials: none    Subjective:  Nurse reported to me earlier today patient in a flutter fib No chest pain at all Quite animated-increased verbal output at  bedside Daughter and daughter-in-law at bedside  Objective: Vitals:   04/28/20 0339 04/28/20 0732 04/28/20 1226 04/28/20 1400  BP: (!) 178/59 (!) 156/50 (!) 161/81   Pulse: (!) 56 (!) 52 90   Resp: 16 18 17    Temp: 97.6 F (36.4 C) 97.7 F (36.5 C) 97.8 F (36.6 C)   TempSrc: Oral Oral Oral   SpO2: 100% 100% 99%   Weight:    84 kg    Intake/Output Summary (Last 24 hours) at 04/28/2020 1510 Last data filed at 04/28/2020 0600 Gross per 24 hour  Intake 1152.96 ml  Output 250 ml  Net 902.96 ml   Filed Weights   04/28/20 1400  Weight: 84 kg    Examination:  Coherent no distress EOMI NCAT smile symmetric temporalis frontalis intact bilaterally shoulder shrug intact bilaterally Finger-nose-finger intact No dysdiadochokinesia CTA B no added sound S1-S2 tachycardic a flutter/fib on monitors Abdomen soft no rebound No lower extremity edema some bruising Power 5/5 reflexes 3/3 sensory intact  Data Reviewed: personally reviewed    Data BUNs/creatinine 20/1.2- WBC 9.2 Hemoglobin 14 Platelet 272   Radiology Studies: CT HEAD WO CONTRAST  Result Date: 04/27/2020 CLINICAL DATA:  Transient ischemic attack. Slurred speech and left-sided facial droop. EXAM: CT HEAD WITHOUT CONTRAST TECHNIQUE: Contiguous axial images were obtained from the base of the skull through the vertex without intravenous contrast. COMPARISON:  None. FINDINGS: Brain: No evidence of acute large vascular territory infarct. No acute hemorrhage. No mass lesion or abnormal mass effect. No hydrocephalus. Mild-to-moderate patchy white matter hypoattenuation, likely related to chronic microvascular ischemic disease. Mild to moderate generalized cerebral volume loss. Vascular: Calcific atherosclerosis. No hyperdense vessel identified. Skull: No acute fracture. Sinuses/Orbits: Mild paranasal sinus mucosal thickening. Unremarkable orbits. Other: No mastoid effusions. IMPRESSION: 1. No evidence of acute intracranial  abnormality. 2. Mild to moderate chronic microvascular ischemic disease and generalized cerebral volume loss. Electronically Signed   By: Margaretha Sheffield MD   On: 04/27/2020 15:44   CT ANGIO NECK W OR WO CONTRAST  Result Date: 04/28/2020 CLINICAL DATA:  TIA.  Carotid stenosis. EXAM: CT ANGIOGRAPHY NECK TECHNIQUE: Multidetector CT imaging of the neck was performed using the standard protocol during bolus administration of intravenous contrast. Multiplanar CT image reconstructions and MIPs were obtained to evaluate the vascular anatomy. Carotid stenosis measurements (when applicable) are obtained utilizing NASCET criteria, using the distal internal carotid diameter as the denominator. CONTRAST:  65mL OMNIPAQUE IOHEXOL 350 MG/ML SOLN COMPARISON:  MRA  neck 04/27/2020 FINDINGS: Aortic arch: Moderate atherosclerotic calcification aortic arch without aneurysm or dissection. Atherosclerotic disease without stenosis in the proximal great vessels. Right carotid system: Mild atherosclerotic disease proximal right internal carotid artery without significant stenosis. Tortuosity of the right internal carotid artery Left carotid system: Mild atherosclerotic disease left common carotid artery. Atherosclerotic disease left carotid bifurcation and proximal left internal carotid artery. Minimal luminal diameter of the internal carotid artery 1.8 mm, corresponding to 60% diameter stenosis. Tortuosity of the left internal carotid artery. Vertebral arteries: Both vertebral arteries patent to the basilar without stenosis. Skeleton: Disc degeneration and mild spurring C5-6 and C6-7. No acute skeletal abnormality. Other neck: Negative Upper chest: Lung apices clear bilaterally. IMPRESSION: 1. 60% diameter stenosis proximal left internal carotid artery due to atherosclerotic disease 2. Right carotid artery widely patent without stenosis. Both vertebral arteries widely patent. 3. Atherosclerotic aortic arch. Electronically  Signed   By:  Franchot Gallo M.D.   On: 04/28/2020 11:24   MR ANGIO HEAD WO CONTRAST  Result Date: 04/27/2020 CLINICAL DATA:  TIA. Episode of slurred speech and facial droop. Left hand weakness. Symptoms lasted 20 minutes. EXAM: MRI HEAD WITHOUT CONTRAST MRA HEAD WITHOUT CONTRAST MRA NECK WITHOUT CONTRAST TECHNIQUE: Multiplanar, multiecho pulse sequences of the brain and surrounding structures were obtained without intravenous contrast. Angiographic images of the Circle of Willis were obtained using MRA technique without intravenous contrast. Angiographic images of the neck were obtained using MRA technique without intravenous contrast. Carotid stenosis measurements (when applicable) are obtained utilizing NASCET criteria, using the distal internal carotid diameter as the denominator. COMPARISON:  CT head without contrast 04/27/2020 FINDINGS: MRI HEAD FINDINGS Brain: Moderate generalized atrophy and white matter disease is present bilaterally. Asymmetric subcortical white matter changes are present in the right frontal operculum and corona radiata. The ventricles are proportionate to the degree of atrophy. No significant extraaxial fluid collection is present. Basal ganglia are within normal limits. The brainstem and cerebellum are within normal limits. The internal auditory canals are within normal limits. Vascular: Flow is present in the major intracranial arteries. Skull and upper cervical spine: The craniocervical junction is normal. Upper cervical spine is within normal limits. Marrow signal is unremarkable. Sinuses/Orbits: The paranasal sinuses and mastoid air cells are clear. The globes and orbits are within normal limits. MRA HEAD FINDINGS The internal carotid arteries are within normal limits from the high cervical segments through the ICA termini bilaterally. The A1 and M1 segments are normal. 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 MCA bifurcations are within normal limits. Moderate 10 UA  shin of distal MCA branch vessels is present bilaterally without a significant proximal stenosis or occlusion. Vertebral arteries are codominant. Right PICA origin visualized and normal. Left AICA is dominant. Mild narrowing is present in the distal vertebral artery at the vertebrobasilar junction. The basilar artery is normal. Left posterior cerebral artery originates from basilar tip. The right posterior cerebral artery is of fetal type. Attenuation PCA branch vessels is noted bilaterally MRA NECK FINDINGS Time-of-flight images demonstrate a 3 vessel arch configuration. Significant signal loss is noted at the proximal left internal carotid artery with decreased more distal signal. There is tortuosity of the internal carotid arteries bilaterally. Flow is antegrade in the vertebral arteries bilaterally. IMPRESSION: 1. No acute intracranial abnormality. 2. Moderate generalized atrophy and white matter disease likely reflects the sequela of chronic microvascular ischemia. 3. No significant proximal stenosis, aneurysm, or branch vessel occlusion within the Circle of Willis. 4. 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 5. Moderate distal small vessel disease in the anterior and posterior circulation without other significant proximal stenosis, aneurysm, or branch vessel occlusion. 6. Moderate to high-grade stenosis of the proximal left internal carotid artery suggest on the time-of-flight images. This could be confirmed with carotid Doppler study or CTA. It is contralateral to the symptomatic side. Electronically Signed   By: San Morelle M.D.   On: 04/27/2020 19:49   MR ANGIO NECK WO CONTRAST  Result Date: 04/27/2020 CLINICAL DATA:  TIA. Episode of slurred speech and facial droop. Left hand weakness. Symptoms lasted 20 minutes. EXAM: MRI HEAD WITHOUT CONTRAST MRA HEAD WITHOUT CONTRAST MRA NECK WITHOUT CONTRAST TECHNIQUE: Multiplanar, multiecho pulse sequences of the brain and surrounding  structures were obtained without intravenous contrast. Angiographic images of the Circle of Willis were obtained using MRA technique without intravenous contrast. Angiographic images of  the neck were obtained using MRA technique without intravenous contrast. Carotid stenosis measurements (when applicable) are obtained utilizing NASCET criteria, using the distal internal carotid diameter as the denominator. COMPARISON:  CT head without contrast 04/27/2020 FINDINGS: MRI HEAD FINDINGS Brain: Moderate generalized atrophy and white matter disease is present bilaterally. Asymmetric subcortical white matter changes are present in the right frontal operculum and corona radiata. The ventricles are proportionate to the degree of atrophy. No significant extraaxial fluid collection is present. Basal ganglia are within normal limits. The brainstem and cerebellum are within normal limits. The internal auditory canals are within normal limits. Vascular: Flow is present in the major intracranial arteries. Skull and upper cervical spine: The craniocervical junction is normal. Upper cervical spine is within normal limits. Marrow signal is unremarkable. Sinuses/Orbits: The paranasal sinuses and mastoid air cells are clear. The globes and orbits are within normal limits. MRA HEAD FINDINGS The internal carotid arteries are within normal limits from the high cervical segments through the ICA termini bilaterally. The A1 and M1 segments are normal. 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 MCA bifurcations are within normal limits. Moderate 10 UA shin of distal MCA branch vessels is present bilaterally without a significant proximal stenosis or occlusion. Vertebral arteries are codominant. Right PICA origin visualized and normal. Left AICA is dominant. Mild narrowing is present in the distal vertebral artery at the vertebrobasilar junction. The basilar artery is normal. Left posterior cerebral artery originates  from basilar tip. The right posterior cerebral artery is of fetal type. Attenuation PCA branch vessels is noted bilaterally MRA NECK FINDINGS Time-of-flight images demonstrate a 3 vessel arch configuration. Significant signal loss is noted at the proximal left internal carotid artery with decreased more distal signal. There is tortuosity of the internal carotid arteries bilaterally. Flow is antegrade in the vertebral arteries bilaterally. IMPRESSION: 1. No acute intracranial abnormality. 2. Moderate generalized atrophy and white matter disease likely reflects the sequela of chronic microvascular ischemia. 3. No significant proximal stenosis, aneurysm, or branch vessel occlusion within the Circle of Willis. 4. 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 5. Moderate distal small vessel disease in the anterior and posterior circulation without other significant proximal stenosis, aneurysm, or branch vessel occlusion. 6. Moderate to high-grade stenosis of the proximal left internal carotid artery suggest on the time-of-flight images. This could be confirmed with carotid Doppler study or CTA. It is contralateral to the symptomatic side. Electronically Signed   By: San Morelle M.D.   On: 04/27/2020 19:49   MR BRAIN WO CONTRAST  Result Date: 04/27/2020 CLINICAL DATA:  TIA. Episode of slurred speech and facial droop. Left hand weakness. Symptoms lasted 20 minutes. EXAM: MRI HEAD WITHOUT CONTRAST MRA HEAD WITHOUT CONTRAST MRA NECK WITHOUT CONTRAST TECHNIQUE: Multiplanar, multiecho pulse sequences of the brain and surrounding structures were obtained without intravenous contrast. Angiographic images of the Circle of Willis were obtained using MRA technique without intravenous contrast. Angiographic images of the neck were obtained using MRA technique without intravenous contrast. Carotid stenosis measurements (when applicable) are obtained utilizing NASCET criteria, using the distal internal carotid  diameter as the denominator. COMPARISON:  CT head without contrast 04/27/2020 FINDINGS: MRI HEAD FINDINGS Brain: Moderate generalized atrophy and white matter disease is present bilaterally. Asymmetric subcortical white matter changes are present in the right frontal operculum and corona radiata. The ventricles are proportionate to the degree of atrophy. No significant extraaxial fluid collection is present. Basal ganglia are within normal limits. The brainstem and  cerebellum are within normal limits. The internal auditory canals are within normal limits. Vascular: Flow is present in the major intracranial arteries. Skull and upper cervical spine: The craniocervical junction is normal. Upper cervical spine is within normal limits. Marrow signal is unremarkable. Sinuses/Orbits: The paranasal sinuses and mastoid air cells are clear. The globes and orbits are within normal limits. MRA HEAD FINDINGS The internal carotid arteries are within normal limits from the high cervical segments through the ICA termini bilaterally. The A1 and M1 segments are normal. 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 MCA bifurcations are within normal limits. Moderate 10 UA shin of distal MCA branch vessels is present bilaterally without a significant proximal stenosis or occlusion. Vertebral arteries are codominant. Right PICA origin visualized and normal. Left AICA is dominant. Mild narrowing is present in the distal vertebral artery at the vertebrobasilar junction. The basilar artery is normal. Left posterior cerebral artery originates from basilar tip. The right posterior cerebral artery is of fetal type. Attenuation PCA branch vessels is noted bilaterally MRA NECK FINDINGS Time-of-flight images demonstrate a 3 vessel arch configuration. Significant signal loss is noted at the proximal left internal carotid artery with decreased more distal signal. There is tortuosity of the internal carotid arteries  bilaterally. Flow is antegrade in the vertebral arteries bilaterally. IMPRESSION: 1. No acute intracranial abnormality. 2. Moderate generalized atrophy and white matter disease likely reflects the sequela of chronic microvascular ischemia. 3. No significant proximal stenosis, aneurysm, or branch vessel occlusion within the Circle of Willis. 4. 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 5. Moderate distal small vessel disease in the anterior and posterior circulation without other significant proximal stenosis, aneurysm, or branch vessel occlusion. 6. Moderate to high-grade stenosis of the proximal left internal carotid artery suggest on the time-of-flight images. This could be confirmed with carotid Doppler study or CTA. It is contralateral to the symptomatic side. Electronically Signed   By: San Morelle M.D.   On: 04/27/2020 19:49   ECHOCARDIOGRAM COMPLETE  Result Date: 04/28/2020    ECHOCARDIOGRAM REPORT   Patient Name:   Amber Kennedy Date of Exam: 04/28/2020 Medical Rec #:  [id number]     Height:       67.0 in Accession #:    [id number]    Weight:       180.0 lb Date of Birth:  [birth year]     BSA:          1.934 m Patient Age:    87 years      BP:           156/50 mmHg Patient Gender: F             HR:           54 bpm. Exam Location:  Inpatient Procedure: 2D Echo, 3D Echo, Cardiac Doppler and Color Doppler Indications:    TIA  History:        Patient has prior history of Echocardiogram examinations, most                 recent 02/06/2017. CHF, Abnormal ECG and Prior CABG, TIA,                 Arrythmias:Atrial Fibrillation; Risk Factors:Diabetes. Edema.  Sonographer:    Roseanna Rainbow RDCS Referring Phys: [id number] [place]  Sonographer Comments: Suboptimal parasternal window. IMPRESSIONS  1. Left ventricular ejection fraction, by estimation, is 60 to 65%. The left ventricle has normal function. The  left ventricle has no regional wall motion abnormalities. There is severe left ventricular  hypertrophy. Left ventricular diastolic parameters  are indeterminate.  2. Right ventricular systolic function is normal. The right ventricular size is normal. There is mildly elevated pulmonary artery systolic pressure.  3. Left atrial size was moderately dilated.  4. The mitral valve is degenerative. Mild mitral valve regurgitation. No evidence of mitral stenosis. Moderate mitral annular calcification.  5. The aortic valve was not well visualized. Aortic valve regurgitation is not visualized. No aortic stenosis is present.  6. Aortic dilatation noted. There is mild dilatation of the ascending aorta, measuring 38 mm.  7. The inferior vena cava is normal in size with greater than 50% respiratory variability, suggesting right atrial pressure of 3 mmHg. FINDINGS  Left Ventricle: Left ventricular ejection fraction, by estimation, is 60 to 65%. The left ventricle has normal function. The left ventricle has no regional wall motion abnormalities. The left ventricular internal cavity size was normal in size. There is  severe left ventricular hypertrophy. Left ventricular diastolic parameters are indeterminate. Right Ventricle: The right ventricular size is normal. No increase in right ventricular wall thickness. Right ventricular systolic function is normal. There is mildly elevated pulmonary artery systolic pressure. The tricuspid regurgitant velocity is 2.84  m/s, and with an assumed right atrial pressure of 8 mmHg, the estimated right ventricular systolic pressure is 96.7 mmHg. Left Atrium: Left atrial size was moderately dilated. Right Atrium: Right atrial size was normal in size. Pericardium: There is no evidence of pericardial effusion. Mitral Valve: The mitral valve is degenerative in appearance. There is mild thickening of the mitral valve leaflet(s). There is mild calcification of the mitral valve leaflet(s). Moderate mitral annular calcification. Mild mitral valve regurgitation. No evidence of mitral valve  stenosis. Tricuspid Valve: The tricuspid valve is normal in structure. Tricuspid valve regurgitation is not demonstrated. No evidence of tricuspid stenosis. Aortic Valve: The aortic valve was not well visualized. Aortic valve regurgitation is not visualized. No aortic stenosis is present. Pulmonic Valve: The pulmonic valve was normal in structure. Pulmonic valve regurgitation is not visualized. No evidence of pulmonic stenosis. Aorta: The aortic root is normal in size and structure and aortic dilatation noted. There is mild dilatation of the ascending aorta, measuring 38 mm. Venous: The inferior vena cava is normal in size with greater than 50% respiratory variability, suggesting right atrial pressure of 3 mmHg. IAS/Shunts: No atrial level shunt detected by color flow Doppler.  LEFT VENTRICLE PLAX 2D LVIDd:         3.80 cm     Diastology LVIDs:         2.20 cm     LV e' medial:    6.09 cm/s LV PW:         1.40 cm     LV E/e' medial:  14.3 LV IVS:        2.00 cm     LV e' lateral:   7.40 cm/s LVOT diam:     2.20 cm     LV E/e' lateral: 11.8 LV SV:         94 LV SV Index:   49 LVOT Area:     3.80 cm  LV Volumes (MOD) LV vol d, MOD A2C: 61.0 ml LV vol d, MOD A4C: 66.0 ml LV vol s, MOD A2C: 13.2 ml LV vol s, MOD A4C: 17.7 ml LV SV MOD A2C:     47.8 ml LV SV MOD A4C:  66.0 ml LV SV MOD BP:      48.5 ml RIGHT VENTRICLE            IVC RV S prime:     8.05 cm/s  IVC diam: 2.00 cm TAPSE (M-mode): 1.5 cm LEFT ATRIUM           Index       RIGHT ATRIUM           Index LA diam:      4.30 cm 2.22 cm/m  RA Area:     12.20 cm LA Vol (A2C): 47.9 ml 24.77 ml/m RA Volume:   23.00 ml  11.89 ml/m LA Vol (A4C): 35.3 ml 18.26 ml/m  AORTIC VALVE LVOT Vmax:   93.30 cm/s LVOT Vmean:  66.700 cm/s LVOT VTI:    0.248 m  AORTA Ao Root diam: 3.80 cm Ao Asc diam:  3.80 cm MITRAL VALVE               TRICUSPID VALVE MV Area (PHT): 3.50 cm    TR Peak grad:   32.3 mmHg MV Decel Time: 217 msec    TR Vmax:        284.00 cm/s MV E velocity:  86.95 cm/s MV A velocity: 84.85 cm/s  SHUNTS MV E/A ratio:  1.02        Systemic VTI:  0.25 m                            Systemic Diam: 2.20 cm Jenkins Rouge MD Electronically signed by Jenkins Rouge MD Signature Date/Time: 04/28/2020/9:50:00 AM    Final      Scheduled Meds: .  stroke: mapping our early stages of recovery book   Does not apply Once  . apixaban  5 mg Oral BID  . metoprolol succinate  100 mg Oral Daily   Continuous Infusions: . sodium chloride 75 mL/hr at 04/28/20 0030  . diltiazem (CARDIZEM) infusion       LOS: 0 days   Time spent: [place], MD [facility] To contact the attending provider between 7A-7P or the covering provider during after hours 7P-7A, please log into the web site [email or web address] and access using universal [place] password for that web site. If you do not have the password, please call the hospital operator.  04/28/2020, 3:10 PM

## 2020-04-29 DIAGNOSIS — G459 Transient cerebral ischemic attack, unspecified: Principal | ICD-10-CM

## 2020-04-29 DIAGNOSIS — R001 Bradycardia, unspecified: Secondary | ICD-10-CM

## 2020-04-29 DIAGNOSIS — I480 Paroxysmal atrial fibrillation: Secondary | ICD-10-CM

## 2020-04-29 LAB — COMPREHENSIVE METABOLIC PANEL
ALT: 15 U/L (ref 0–44)
AST: 21 U/L (ref 15–41)
Albumin: 3.4 g/dL — ABNORMAL LOW (ref 3.5–5.0)
Alkaline Phosphatase: 59 U/L (ref 38–126)
Anion gap: 7 (ref 5–15)
BUN: 16 mg/dL (ref 8–23)
CO2: 25 mmol/L (ref 22–32)
Calcium: 9.3 mg/dL (ref 8.9–10.3)
Chloride: 108 mmol/L (ref 98–111)
Creatinine, Ser: 1.01 mg/dL — ABNORMAL HIGH (ref 0.44–1.00)
GFR, Estimated: 55 mL/min — ABNORMAL LOW (ref >60)
Glucose, Bld: 107 mg/dL — ABNORMAL HIGH (ref 70–99)
Potassium: 4.1 mmol/L (ref 3.5–5.1)
Sodium: 140 mmol/L (ref 135–145)
Total Bilirubin: 0.7 mg/dL (ref 0.3–1.2)
Total Protein: 6.5 g/dL (ref 6.5–8.1)

## 2020-04-29 LAB — CBC WITH DIFFERENTIAL/PLATELET
Abs Immature Granulocytes: 0.03 10*3/uL (ref 0.00–0.07)
Basophils Absolute: 0.1 10*3/uL (ref 0.0–0.1)
Basophils Relative: 1 %
Eosinophils Absolute: 0.2 10*3/uL (ref 0.0–0.5)
Eosinophils Relative: 2 %
HCT: 43.8 % (ref 36.0–46.0)
Hemoglobin: 14.6 g/dL (ref 12.0–15.0)
Immature Granulocytes: 0 %
Lymphocytes Relative: 34 %
Lymphs Abs: 2.9 10*3/uL (ref 0.7–4.0)
MCH: 30.7 pg (ref 26.0–34.0)
MCHC: 33.3 g/dL (ref 30.0–36.0)
MCV: 92.2 fL (ref 80.0–100.0)
Monocytes Absolute: 0.6 10*3/uL (ref 0.1–1.0)
Monocytes Relative: 7 %
Neutro Abs: 5 10*3/uL (ref 1.7–7.7)
Neutrophils Relative %: 56 %
Platelets: 251 10*3/uL (ref 150–400)
RBC: 4.75 MIL/uL (ref 3.87–5.11)
RDW: 12.9 % (ref 11.5–15.5)
WBC: 8.8 10*3/uL (ref 4.0–10.5)
nRBC: 0 % (ref 0.0–0.2)

## 2020-04-29 LAB — MAGNESIUM: Magnesium: 2 mg/dL (ref 1.7–2.4)

## 2020-04-29 MED ORDER — DILTIAZEM HCL 30 MG PO TABS: 30.0000 mg | ORAL_TABLET | Freq: Three times a day (TID) | ORAL | Status: DC

## 2020-04-29 NOTE — Progress Notes (Signed)
Pt converted to Sinus Rhythm right before change of shift. Now 50-60s HR. HOC given to [employer]. Telemetry monitoring continued. Safety maintained. Relinquishing care.

## 2020-04-29 NOTE — Progress Notes (Signed)
PROGRESS NOTE   Amber Kennedy  NOM:[id number] DOB: August 31, 1933 DOA: 04/27/2020 PCP: [name], MD  Brief Narrative:  87 [place] A. fib (Dr. Virgina Jock [place])/chad >4/Xarelto diagnosed 01/2017 HFpEF Echo grade II DD 60-65% PAP 35 mm Poor vision (not blind)-Per neurology?  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 fib with probable tachybradycardia syndrome CHADS2 score >4 Transitioning to likely Eliquis-Case management consulted to ensure can afford Resumed Toprol-XL 100 Magnesium 2 [place] cardiology consulted 4/4 secondary to pauses in addition to rapid A. fib -now on diltiazem gtt. with plans for transition per them ?  EP input today for PPM HF PEF grade 2 DD with pulmonary HTN Lasix on hold IV fluid 125 cc/h until seen by neurology, currently +1 L Prior ocular CVAs? Outpatient neuro-ophthalmology evaluation Hypothyroid? TSH normal   DVT prophylaxis: Eliquis Code Status: DNR confirmed Family Communication: Discussed with daughter at the bedside and updated Disposition:  Status is: Observation  The patient will require care spanning > 2 midnights and should be moved to inpatient because: Ongoing active pain requiring inpatient pain management, Ongoing diagnostic  testing needed not appropriate for outpatient work up and IV treatments appropriate due to intensity of illness or inability to take PO  Dispo: The patient is from: Home              Anticipated d/c is to: Home              Patient currently is not medically stable to d/c.  Because of tachycardia and need for probable pacemaker Therapy recommends outpatient PT OT on discharge which can be arranged at the time of discharge   Difficult to place patient No       Consultants:   Cardiologist Dr. Geanie Logan  Neurology  Procedures:  CTA neck 4/4 = IMPRESSION: 1. 60% diameter stenosis proximal left internal carotid artery due to atherosclerotic disease 2. Right carotid artery widely patent without stenosis. Both vertebral arteries widely patent. 3. Atherosclerotic aortic arch.   Echo 4/4 = IMPRESSIONS    1. Left ventricular ejection fraction, by estimation, is 60 to 65%. The  left ventricle has normal function. The left ventricle has no regional  wall motion abnormalities. There is severe left ventricular hypertrophy.  Left ventricular diastolic parameters  are indeterminate.  2. Right ventricular systolic function is normal. The right ventricular  size is normal. There is mildly elevated pulmonary artery systolic  pressure.  3. Left atrial size was moderately dilated.  4. The mitral valve is degenerative. Mild mitral valve regurgitation. No  evidence of mitral stenosis. Moderate mitral annular calcification.  5. The aortic valve was not well visualized. Aortic valve regurgitation  is not visualized. No aortic stenosis is present.  6. Aortic dilatation noted. There is mild dilatation of the ascending  aorta, measuring 38 mm.  7. The inferior vena cava is normal in size with greater than 50%  respiratory variability,  suggesting right atrial pressure of 3 mmHg.   Antimicrobials: none    Subjective: Events noted Quite anxious-medicated with Xanax earlier today-some  itching at IV site and nursing aware to rotate No chest pain no fever no chills No new deficit up to the bedside commode without issue although some assistance    Objective: Vitals:   04/29/20 0234 04/29/20 0235 04/29/20 0302 04/29/20 0352  BP:   (!) 105/52 131/86  Pulse:    73  Resp: 16 16 17 13   Temp:    97.6 F (36.4 C)  TempSrc:    Oral  SpO2:    96%  Weight:        Intake/Output Summary (Last 24 hours) at 04/29/2020 1027 Last data filed at 04/29/2020 0630 Gross per 24 hour  Intake 1353.43 ml  Output 300 ml  Net 1053.43 ml   Filed Weights   04/28/20 1400  Weight: 84 kg    Examination:  Smile symmetric coherent less anxious CTA B no added sound S1-S2 tachycardic rate controlled A. fib 60s to 80s  Abdomen soft no rebound No lower extremity edema   Data Reviewed: personally reviewed    Data BUNs/creatinine 20/1.2-->16/1.01 WBC 9.2-->8.8 Hemoglobin 14-->14.6   Radiology Studies: CT HEAD WO CONTRAST  Result Date: 04/27/2020 CLINICAL DATA:  Transient ischemic attack. Slurred speech and left-sided facial droop. EXAM: CT HEAD WITHOUT CONTRAST TECHNIQUE: Contiguous axial images were obtained from the base of the skull through the vertex without intravenous contrast. COMPARISON:  None. FINDINGS: Brain: No evidence of acute large vascular territory infarct. No acute hemorrhage. No mass lesion or abnormal mass effect. No hydrocephalus. Mild-to-moderate patchy white matter hypoattenuation, likely related to chronic microvascular ischemic disease. Mild to moderate generalized cerebral volume loss. Vascular: Calcific atherosclerosis. No hyperdense vessel identified. Skull: No acute fracture. Sinuses/Orbits: Mild paranasal sinus mucosal thickening. Unremarkable orbits. Other: No mastoid effusions. IMPRESSION: 1. No evidence of acute intracranial abnormality. 2. Mild to moderate chronic microvascular ischemic disease and generalized cerebral volume loss. Electronically Signed   By:  Margaretha Sheffield MD   On: 04/27/2020 15:44   CT ANGIO NECK W OR WO CONTRAST  Result Date: 04/28/2020 CLINICAL DATA:  TIA.  Carotid stenosis. EXAM: CT ANGIOGRAPHY NECK TECHNIQUE: Multidetector CT imaging of the neck was performed using the standard protocol during bolus administration of intravenous contrast. Multiplanar CT image reconstructions and MIPs were obtained to evaluate the vascular anatomy. Carotid stenosis measurements (when applicable) are obtained utilizing NASCET criteria, using the distal internal carotid diameter as the denominator. CONTRAST:  57mL OMNIPAQUE IOHEXOL 350 MG/ML SOLN COMPARISON:  MRA  neck 04/27/2020 FINDINGS: Aortic arch: Moderate atherosclerotic calcification aortic arch without aneurysm or dissection. Atherosclerotic disease without stenosis in the proximal great vessels. Right carotid system: Mild atherosclerotic disease proximal right internal carotid artery without significant stenosis. Tortuosity of the right internal carotid artery Left carotid system: Mild atherosclerotic disease left common carotid artery. Atherosclerotic disease left carotid bifurcation and proximal left internal carotid artery. Minimal luminal diameter of the internal carotid artery 1.8 mm, corresponding to 60% diameter stenosis. Tortuosity of the left internal carotid artery. Vertebral arteries: Both vertebral arteries patent to the basilar without stenosis. Skeleton: Disc degeneration and mild spurring C5-6 and C6-7. No acute skeletal abnormality. Other neck: Negative Upper chest: Lung apices clear bilaterally. IMPRESSION: 1. 60% diameter stenosis proximal left internal carotid artery due to atherosclerotic disease 2. Right carotid artery widely patent without stenosis. Both vertebral arteries widely patent. 3. Atherosclerotic aortic arch. Electronically Signed  By: Franchot Gallo M.D.   On: 04/28/2020 11:24   MR ANGIO HEAD WO CONTRAST  Result Date: 04/27/2020 CLINICAL DATA:  TIA. Episode of  slurred speech and facial droop. Left hand weakness. Symptoms lasted 20 minutes. EXAM: MRI HEAD WITHOUT CONTRAST MRA HEAD WITHOUT CONTRAST MRA NECK WITHOUT CONTRAST TECHNIQUE: Multiplanar, multiecho pulse sequences of the brain and surrounding structures were obtained without intravenous contrast. Angiographic images of the Circle of Willis were obtained using MRA technique without intravenous contrast. Angiographic images of the neck were obtained using MRA technique without intravenous contrast. Carotid stenosis measurements (when applicable) are obtained utilizing NASCET criteria, using the distal internal carotid diameter as the denominator. COMPARISON:  CT head without contrast 04/27/2020 FINDINGS: MRI HEAD FINDINGS Brain: Moderate generalized atrophy and white matter disease is present bilaterally. Asymmetric subcortical white matter changes are present in the right frontal operculum and corona radiata. The ventricles are proportionate to the degree of atrophy. No significant extraaxial fluid collection is present. Basal ganglia are within normal limits. The brainstem and cerebellum are within normal limits. The internal auditory canals are within normal limits. Vascular: Flow is present in the major intracranial arteries. Skull and upper cervical spine: The craniocervical junction is normal. Upper cervical spine is within normal limits. Marrow signal is unremarkable. Sinuses/Orbits: The paranasal sinuses and mastoid air cells are clear. The globes and orbits are within normal limits. MRA HEAD FINDINGS The internal carotid arteries are within normal limits from the high cervical segments through the ICA termini bilaterally. The A1 and M1 segments are normal. 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 MCA bifurcations are within normal limits. Moderate 10 UA shin of distal MCA branch vessels is present bilaterally without a significant proximal stenosis or occlusion. Vertebral arteries  are codominant. Right PICA origin visualized and normal. Left AICA is dominant. Mild narrowing is present in the distal vertebral artery at the vertebrobasilar junction. The basilar artery is normal. Left posterior cerebral artery originates from basilar tip. The right posterior cerebral artery is of fetal type. Attenuation PCA branch vessels is noted bilaterally MRA NECK FINDINGS Time-of-flight images demonstrate a 3 vessel arch configuration. Significant signal loss is noted at the proximal left internal carotid artery with decreased more distal signal. There is tortuosity of the internal carotid arteries bilaterally. Flow is antegrade in the vertebral arteries bilaterally. IMPRESSION: 1. No acute intracranial abnormality. 2. Moderate generalized atrophy and white matter disease likely reflects the sequela of chronic microvascular ischemia. 3. No significant proximal stenosis, aneurysm, or branch vessel occlusion within the Circle of Willis. 4. 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 5. Moderate distal small vessel disease in the anterior and posterior circulation without other significant proximal stenosis, aneurysm, or branch vessel occlusion. 6. Moderate to high-grade stenosis of the proximal left internal carotid artery suggest on the time-of-flight images. This could be confirmed with carotid Doppler study or CTA. It is contralateral to the symptomatic side. Electronically Signed   By: San Morelle M.D.   On: 04/27/2020 19:49   MR ANGIO NECK WO CONTRAST  Result Date: 04/27/2020 CLINICAL DATA:  TIA. Episode of slurred speech and facial droop. Left hand weakness. Symptoms lasted 20 minutes. EXAM: MRI HEAD WITHOUT CONTRAST MRA HEAD WITHOUT CONTRAST MRA NECK WITHOUT CONTRAST TECHNIQUE: Multiplanar, multiecho pulse sequences of the brain and surrounding structures were obtained without intravenous contrast. Angiographic images of the Circle of Willis were obtained using MRA technique  without intravenous contrast. Angiographic images of the neck were  obtained using MRA technique without intravenous contrast. Carotid stenosis measurements (when applicable) are obtained utilizing NASCET criteria, using the distal internal carotid diameter as the denominator. COMPARISON:  CT head without contrast 04/27/2020 FINDINGS: MRI HEAD FINDINGS Brain: Moderate generalized atrophy and white matter disease is present bilaterally. Asymmetric subcortical white matter changes are present in the right frontal operculum and corona radiata. The ventricles are proportionate to the degree of atrophy. No significant extraaxial fluid collection is present. Basal ganglia are within normal limits. The brainstem and cerebellum are within normal limits. The internal auditory canals are within normal limits. Vascular: Flow is present in the major intracranial arteries. Skull and upper cervical spine: The craniocervical junction is normal. Upper cervical spine is within normal limits. Marrow signal is unremarkable. Sinuses/Orbits: The paranasal sinuses and mastoid air cells are clear. The globes and orbits are within normal limits. MRA HEAD FINDINGS The internal carotid arteries are within normal limits from the high cervical segments through the ICA termini bilaterally. The A1 and M1 segments are normal. 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 MCA bifurcations are within normal limits. Moderate 10 UA shin of distal MCA branch vessels is present bilaterally without a significant proximal stenosis or occlusion. Vertebral arteries are codominant. Right PICA origin visualized and normal. Left AICA is dominant. Mild narrowing is present in the distal vertebral artery at the vertebrobasilar junction. The basilar artery is normal. Left posterior cerebral artery originates from basilar tip. The right posterior cerebral artery is of fetal type. Attenuation PCA branch vessels is noted bilaterally MRA NECK  FINDINGS Time-of-flight images demonstrate a 3 vessel arch configuration. Significant signal loss is noted at the proximal left internal carotid artery with decreased more distal signal. There is tortuosity of the internal carotid arteries bilaterally. Flow is antegrade in the vertebral arteries bilaterally. IMPRESSION: 1. No acute intracranial abnormality. 2. Moderate generalized atrophy and white matter disease likely reflects the sequela of chronic microvascular ischemia. 3. No significant proximal stenosis, aneurysm, or branch vessel occlusion within the Circle of Willis. 4. 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 5. Moderate distal small vessel disease in the anterior and posterior circulation without other significant proximal stenosis, aneurysm, or branch vessel occlusion. 6. Moderate to high-grade stenosis of the proximal left internal carotid artery suggest on the time-of-flight images. This could be confirmed with carotid Doppler study or CTA. It is contralateral to the symptomatic side. Electronically Signed   By: San Morelle M.D.   On: 04/27/2020 19:49   MR BRAIN WO CONTRAST  Result Date: 04/27/2020 CLINICAL DATA:  TIA. Episode of slurred speech and facial droop. Left hand weakness. Symptoms lasted 20 minutes. EXAM: MRI HEAD WITHOUT CONTRAST MRA HEAD WITHOUT CONTRAST MRA NECK WITHOUT CONTRAST TECHNIQUE: Multiplanar, multiecho pulse sequences of the brain and surrounding structures were obtained without intravenous contrast. Angiographic images of the Circle of Willis were obtained using MRA technique without intravenous contrast. Angiographic images of the neck were obtained using MRA technique without intravenous contrast. Carotid stenosis measurements (when applicable) are obtained utilizing NASCET criteria, using the distal internal carotid diameter as the denominator. COMPARISON:  CT head without contrast 04/27/2020 FINDINGS: MRI HEAD FINDINGS Brain: Moderate generalized  atrophy and white matter disease is present bilaterally. Asymmetric subcortical white matter changes are present in the right frontal operculum and corona radiata. The ventricles are proportionate to the degree of atrophy. No significant extraaxial fluid collection is present. Basal ganglia are within normal limits. The brainstem and cerebellum are within  normal limits. The internal auditory canals are within normal limits. Vascular: Flow is present in the major intracranial arteries. Skull and upper cervical spine: The craniocervical junction is normal. Upper cervical spine is within normal limits. Marrow signal is unremarkable. Sinuses/Orbits: The paranasal sinuses and mastoid air cells are clear. The globes and orbits are within normal limits. MRA HEAD FINDINGS The internal carotid arteries are within normal limits from the high cervical segments through the ICA termini bilaterally. The A1 and M1 segments are normal. 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 MCA bifurcations are within normal limits. Moderate 10 UA shin of distal MCA branch vessels is present bilaterally without a significant proximal stenosis or occlusion. Vertebral arteries are codominant. Right PICA origin visualized and normal. Left AICA is dominant. Mild narrowing is present in the distal vertebral artery at the vertebrobasilar junction. The basilar artery is normal. Left posterior cerebral artery originates from basilar tip. The right posterior cerebral artery is of fetal type. Attenuation PCA branch vessels is noted bilaterally MRA NECK FINDINGS Time-of-flight images demonstrate a 3 vessel arch configuration. Significant signal loss is noted at the proximal left internal carotid artery with decreased more distal signal. There is tortuosity of the internal carotid arteries bilaterally. Flow is antegrade in the vertebral arteries bilaterally. IMPRESSION: 1. No acute intracranial abnormality. 2. Moderate generalized  atrophy and white matter disease likely reflects the sequela of chronic microvascular ischemia. 3. No significant proximal stenosis, aneurysm, or branch vessel occlusion within the Circle of Willis. 4. 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 5. Moderate distal small vessel disease in the anterior and posterior circulation without other significant proximal stenosis, aneurysm, or branch vessel occlusion. 6. Moderate to high-grade stenosis of the proximal left internal carotid artery suggest on the time-of-flight images. This could be confirmed with carotid Doppler study or CTA. It is contralateral to the symptomatic side. Electronically Signed   By: San Morelle M.D.   On: 04/27/2020 19:49   ECHOCARDIOGRAM COMPLETE  Result Date: 04/28/2020    ECHOCARDIOGRAM REPORT   Patient Name:   Amber Kennedy Date of Exam: 04/28/2020 Medical Rec #:  [id number]     Height:       67.0 in Accession #:    [id number]    Weight:       180.0 lb Date of Birth:  1934-01-22     BSA:          1.934 m Patient Age:    87 years      BP:           156/50 mmHg Patient Gender: F             HR:           54 bpm. Exam Location:  Inpatient Procedure: 2D Echo, 3D Echo, Cardiac Doppler and Color Doppler Indications:    TIA  History:        Patient has prior history of Echocardiogram examinations, most                 recent 02/06/2017. CHF, Abnormal ECG and Prior CABG, TIA,                 Arrythmias:Atrial Fibrillation; Risk Factors:Diabetes. Edema.  Sonographer:    Roseanna Rainbow RDCS Referring Phys: [id number] [place]  Sonographer Comments: Suboptimal parasternal window. IMPRESSIONS  1. Left ventricular ejection fraction, by estimation, is 60 to 65%. The left ventricle has normal function. The left ventricle has  no regional wall motion abnormalities. There is severe left ventricular hypertrophy. Left ventricular diastolic parameters  are indeterminate.  2. Right ventricular systolic function is normal. The right ventricular  size is normal. There is mildly elevated pulmonary artery systolic pressure.  3. Left atrial size was moderately dilated.  4. The mitral valve is degenerative. Mild mitral valve regurgitation. No evidence of mitral stenosis. Moderate mitral annular calcification.  5. The aortic valve was not well visualized. Aortic valve regurgitation is not visualized. No aortic stenosis is present.  6. Aortic dilatation noted. There is mild dilatation of the ascending aorta, measuring 38 mm.  7. The inferior vena cava is normal in size with greater than 50% respiratory variability, suggesting right atrial pressure of 3 mmHg. FINDINGS  Left Ventricle: Left ventricular ejection fraction, by estimation, is 60 to 65%. The left ventricle has normal function. The left ventricle has no regional wall motion abnormalities. The left ventricular internal cavity size was normal in size. There is  severe left ventricular hypertrophy. Left ventricular diastolic parameters are indeterminate. Right Ventricle: The right ventricular size is normal. No increase in right ventricular wall thickness. Right ventricular systolic function is normal. There is mildly elevated pulmonary artery systolic pressure. The tricuspid regurgitant velocity is 2.84  m/s, and with an assumed right atrial pressure of 8 mmHg, the estimated right ventricular systolic pressure is 03.5 mmHg. Left Atrium: Left atrial size was moderately dilated. Right Atrium: Right atrial size was normal in size. Pericardium: There is no evidence of pericardial effusion. Mitral Valve: The mitral valve is degenerative in appearance. There is mild thickening of the mitral valve leaflet(s). There is mild calcification of the mitral valve leaflet(s). Moderate mitral annular calcification. Mild mitral valve regurgitation. No evidence of mitral valve stenosis. Tricuspid Valve: The tricuspid valve is normal in structure. Tricuspid valve regurgitation is not demonstrated. No evidence of tricuspid  stenosis. Aortic Valve: The aortic valve was not well visualized. Aortic valve regurgitation is not visualized. No aortic stenosis is present. Pulmonic Valve: The pulmonic valve was normal in structure. Pulmonic valve regurgitation is not visualized. No evidence of pulmonic stenosis. Aorta: The aortic root is normal in size and structure and aortic dilatation noted. There is mild dilatation of the ascending aorta, measuring 38 mm. Venous: The inferior vena cava is normal in size with greater than 50% respiratory variability, suggesting right atrial pressure of 3 mmHg. IAS/Shunts: No atrial level shunt detected by color flow Doppler.  LEFT VENTRICLE PLAX 2D LVIDd:         3.80 cm     Diastology LVIDs:         2.20 cm     LV e' medial:    6.09 cm/s LV PW:         1.40 cm     LV E/e' medial:  14.3 LV IVS:        2.00 cm     LV e' lateral:   7.40 cm/s LVOT diam:     2.20 cm     LV E/e' lateral: 11.8 LV SV:         94 LV SV Index:   49 LVOT Area:     3.80 cm  LV Volumes (MOD) LV vol d, MOD A2C: 61.0 ml LV vol d, MOD A4C: 66.0 ml LV vol s, MOD A2C: 13.2 ml LV vol s, MOD A4C: 17.7 ml LV SV MOD A2C:     47.8 ml LV SV MOD A4C:     66.0 ml  LV SV MOD BP:      48.5 ml RIGHT VENTRICLE            IVC RV S prime:     8.05 cm/s  IVC diam: 2.00 cm TAPSE (M-mode): 1.5 cm LEFT ATRIUM           Index       RIGHT ATRIUM           Index LA diam:      4.30 cm 2.22 cm/m  RA Area:     12.20 cm LA Vol (A2C): 47.9 ml 24.77 ml/m RA Volume:   23.00 ml  11.89 ml/m LA Vol (A4C): 35.3 ml 18.26 ml/m  AORTIC VALVE LVOT Vmax:   93.30 cm/s LVOT Vmean:  66.700 cm/s LVOT VTI:    0.248 m  AORTA Ao Root diam: 3.80 cm Ao Asc diam:  3.80 cm MITRAL VALVE               TRICUSPID VALVE MV Area (PHT): 3.50 cm    TR Peak grad:   32.3 mmHg MV Decel Time: 217 msec    TR Vmax:        284.00 cm/s MV E velocity: 86.95 cm/s MV A velocity: 84.85 cm/s  SHUNTS MV E/A ratio:  1.02        Systemic VTI:  0.25 m                            Systemic Diam: 2.20 cm Jenkins Rouge MD Electronically signed by Jenkins Rouge MD Signature Date/Time: 04/28/2020/9:50:00 AM    Final      Scheduled Meds: .  stroke: mapping our early stages of recovery book   Does not apply Once  . apixaban  5 mg Oral BID  . metoprolol succinate  100 mg Oral Daily   Continuous Infusions: . sodium chloride 125 mL/hr at 04/29/20 0746  . diltiazem (CARDIZEM) infusion 5 mg/hr (04/28/20 2350)     LOS: 1 day   Time spent: Chamita, MD [facility] To contact the attending provider between 7A-7P or the covering provider during after hours 7P-7A, please log into the web site [email or web address] and access using universal  password for that web site. If you do not have the password, please call the hospital operator.  04/29/2020, 10:27 AM

## 2020-04-29 NOTE — Progress Notes (Signed)
Subjective:  Feels well. No complaints  Telemetry shows up to 9 sec pause. This correlated with patient on commode, felt dizzy. No syncope.   Objective:  Vital Signs in the last 24 hours: Temp:  [97.6 F (36.4 C)-97.8 F (36.6 C)] 97.8 F (36.6 C) (04/05 0731) Pulse Rate:  [68-81] 72 (04/05 1258) Resp:  [12-20] 18 (04/05 1258) BP: (101-139)/(48-86) 119/67 (04/05 1258) SpO2:  [95 %-98 %] 98 % (04/05 1258)  Intake/Output from previous day: 04/04 0701 - 04/05 0700 In: 1353.4 [P.O.:360; I.V.:993.4] Out: 300 [Urine:300]  Physical Exam Vitals and nursing note reviewed.  Constitutional:      General: She is not in acute distress.    Appearance: She is well-developed.  HENT:     Head: Normocephalic and atraumatic.  Eyes:     Conjunctiva/sclera: Conjunctivae normal.     Pupils: Pupils are equal, round, and reactive to light.  Neck:     Vascular: No JVD.  Cardiovascular:     Rate and Rhythm: Normal rate. Rhythm irregular.     Pulses: Normal pulses and intact distal pulses.     Heart sounds: No murmur heard.   Pulmonary:     Effort: Pulmonary effort is normal.     Breath sounds: Normal breath sounds. No wheezing or rales.  Abdominal:     General: Bowel sounds are normal.     Palpations: Abdomen is soft.     Tenderness: There is no rebound.  Musculoskeletal:        General: No tenderness. Normal range of motion.     Left lower leg: No edema.  Lymphadenopathy:     Cervical: No cervical adenopathy.  Skin:    General: Skin is warm and dry.  Neurological:     Mental Status: She is alert and oriented to person, place, and time.     Cranial Nerves: No cranial nerve deficit.      Lab Results: BMP Recent Labs    04/27/20 1408 04/28/20 0934 04/29/20 0716  NA 138 138 140  K 4.7 3.9 4.1  CL 106 106 108  CO2 23 25 25   GLUCOSE 120* 100* 107*  BUN 20 22 16   CREATININE 1.23* 1.05* 1.01*  CALCIUM 9.6 9.1 9.3  GFRNONAA 43* 52* 55*    CBC Recent Labs  Lab  04/29/20 0716  WBC 8.8  RBC 4.75  HGB 14.6  HCT 43.8  PLT 251  MCV 92.2  MCH 30.7  MCHC 33.3  RDW 12.9  LYMPHSABS 2.9  MONOABS 0.6  EOSABS 0.2  BASOSABS 0.1    HEMOGLOBIN A1C Lab Results  Component Value Date   HGBA1C 5.7 (H) 04/28/2020   MPG 116.89 04/28/2020     TSH Recent Labs    04/27/20 2259 04/28/20 1811  TSH 3.836 3.057    Lipid Panel     Component Value Date/Time   CHOL 152 04/28/2020 0934   TRIG 143 04/28/2020 0934   HDL 32 (L) 04/28/2020 0934   CHOLHDL 4.8 04/28/2020 0934   VLDL 29 04/28/2020 0934   LDLCALC 91 04/28/2020 0934     Hepatic Function Panel Recent Labs    04/27/20 1408 04/28/20 0934 04/29/20 0716  PROT 7.0 6.0* 6.5  ALBUMIN 3.8 3.2* 3.4*  AST 19 19 21   ALT 16 15 15   ALKPHOS 63 51 59  BILITOT 0.7 0.6 0.7    CTA nexk 04/28/2020: 1. 60% diameter stenosis proximal left internal carotid artery due to atherosclerotic disease 2. Right carotid artery widely patent  without stenosis. Both vertebral arteries widely patent. 3. Atherosclerotic aortic arch.    Cardiac Studies:  EKG 04/29/2020: Atrial flutter with variable AV conduction, ventricular rate 68 bpm  Echocardiogram 04/28/2020: 1. Left ventricular ejection fraction, by estimation, is 60 to 65%. The  left ventricle has normal function. The left ventricle has no regional  wall motion abnormalities. There is severe left ventricular hypertrophy.  Left ventricular diastolic parameters  are indeterminate.  2. Right ventricular systolic function is normal. The right ventricular  size is normal. There is mildly elevated pulmonary artery systolic  pressure.  3. Left atrial size was moderately dilated.  4. The mitral valve is degenerative. Mild mitral valve regurgitation. No  evidence of mitral stenosis. Moderate mitral annular calcification.  5. The aortic valve was not well visualized. Aortic valve regurgitation  is not visualized. No aortic stenosis is present.  6.  Aortic dilatation noted. There is mild dilatation of the ascending  aorta, measuring 38 mm.  7. The inferior vena cava is normal in size with greater than 50%  respiratory variability, suggesting right atrial pressure of 3 mmHg.   Assessment & Recommendations:  87 y.o. Caucasian female with hypertension, hyperlipidemia, paroxysmal atrial fibrillation, TIA  TIA: Management as per Neurology.  [id number] score 6. Annual stroke risk 9%. Xarelto failure, switched to eliquis.  PAF: Afib/flutter with VR 30-140 Pause likely vasovagal in etiology. Consulted EP for possible tachy-brady arrhtymia and need for pacemaker    Nigel Mormon, MD Pager: [phone] Office: [phone]

## 2020-04-29 NOTE — Consult Note (Addendum)
ELECTROPHYSIOLOGY CONSULT NOTE    Patient ID: Amber Kennedy MRN: [id number], DOB/AGE: 10-02-1933 87 y.o.  Admit date: 04/27/2020 Date of Consult: 04/29/2020  Primary Physician: [name], MD Primary Cardiologist: Candee Furbish, MD  Electrophysiologist: New  Referring Provider: Dr. Virgina Jock  Patient Profile: LATANIA Kennedy is a 87 y.o. 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 Virgina Jock.  HPI:  Amber Kennedy is a 87 y.o. female with medical history as above.   She presented to [facility]/27/20 with slurred speech, and left sided facial droop and hand weakness. These symptoms lasted for 30-40 minutes prior to resolving completely.   Cardiology was consulted for AF with labile ventricular rate despite Toprol XL, lopressor prn, and diltiazem.   Diltiazem gtt started and Dr. Terri Skains was then paged for pauses on telemetry. Pt denied chest pain, SOB, orthopnea, PND, or edema. HRs ranging 100-147 bpm with pauses as long as 7.5 seconds (in the setting of bearing down while on the commode).  She felt lightheadedness with this that resolved quickly without any near syncope or syncopal events.  Overall has been hemodynamically stable.   EP asked to see with continued intermittent pauses in setting of multiple AV nodal blockers, as well as labile ventricular rates concerning for tachy-brady syndrome. Dr. Virgina Jock reports that the pauses were NOT consistent with post-conversion pauses.   Currently, she feels fine. She had lightheadedness last night with the 7.5 second pause, but no further "episodes" today.   She reports an episode of syncope "years ago" in the setting of bearing down with constipation in 2019. She was also noted to be in AF with RVR during that visit.  Otherwise, she has been doing well, and would like to get "further out from all this".  Denies CP. No current SOB.    Past Medical History:   Diagnosis Date  . A-fib ([place])   . Gout   . Hypercholesterolemia   . Hypertension   . Hypokalemia   . Hyponatremia   . Vision loss      Surgical History:  Past Surgical History:  Procedure Laterality Date  . CESAREAN SECTION  1972     Medications Prior to Admission  Medication Sig Dispense Refill Last Dose  . metoprolol succinate (TOPROL-XL) 100 MG 24 hr tablet Take 1 tablet (100 mg total) by mouth daily. 90 tablet 2 04/27/2020 at 0800  . Multiple Vitamins-Calcium (ONE-A-DAY WOMENS FORMULA) TABS Take 1 tablet by mouth daily with breakfast.   04/27/2020 at am  . rivaroxaban (XARELTO) 20 MG TABS tablet Take 1 tablet (20 mg total) by mouth daily with supper. (Patient taking differently: Take 20 mg by mouth daily with lunch.) 90 tablet 0 04/26/2020 at 1700  . vitamin C (ASCORBIC ACID) 500 MG tablet Take 500 mg by mouth daily as needed (for supplementation).   unk  . furosemide (LASIX) 20 MG tablet Take 1 tablet (20 mg total) by mouth as needed. (Patient not taking: Reported on 04/27/2020) 60 tablet 2 Not Taking at Unknown time    Inpatient Medications:  .  stroke: mapping our early stages of recovery book   Does not apply Once  . apixaban  5 mg Oral BID  . metoprolol succinate  100 mg Oral Daily    Allergies:  Allergies  Allergen Reactions  . Allopurinol Other (See Comments)    "Made me feel badly, so I stopped  taking it"  . Amlodipine Besylate Swelling and Other (See Comments)    Edema (legs)  . Penicillins Hives and Other (See Comments)    "The reaction happened a long time ago. I'm willing to take it now, if necessary."  . Albuterol Anxiety and Other (See Comments)    Made the patient jittery  . Latex Rash and Other (See Comments)    Allergic to Band-Aids  . Penicillin G Hives and Other (See Comments)    "The reaction happened a long time ago. I'm willing to take it now, if necessary."    Social History   Socioeconomic History  . Marital status: Widowed    Spouse name:  Maraki Macquarrie  . Number of children: 6  . Years of education: Not on file  . Highest education level: Not on file  Occupational History  . Occupation: Retired  Tobacco Use  . Smoking status: Former Smoker    Packs/day: 0.50    Years: 35.00    Pack years: 17.50    Types: Cigarettes    Quit date: 2004    Years since quitting: 18.2  . Smokeless tobacco: Never Used  Vaping Use  . Vaping Use: Never used  Substance and Sexual Activity  . Alcohol use: No  . Drug use: No  . Sexual activity: Not on file  Other Topics Concern  . Not on file  Social History Narrative  . Not on file   Social Determinants of Health   Financial Resource Strain: Not on file  Food Insecurity: Not on file  Transportation Needs: Not on file  Physical Activity: Not on file  Stress: Not on file  Social Connections: Not on file  Intimate Partner Violence: Not on file     Family History  Problem Relation Age of Onset  . Sudden death Father   . Sudden Cardiac Death Neg Hx      Review of Systems: All other systems reviewed and are otherwise negative except as noted above.  Physical Exam: Vitals:   04/29/20 0352 04/29/20 0731 04/29/20 0801 04/29/20 1024  BP: 131/86 124/80 128/63 112/62  Pulse: 73 81 75 68  Resp: 13 16 17 17   Temp: 97.6 F (36.4 C) 97.8 F (36.6 C)    TempSrc: Oral Oral    SpO2: 96%     Weight:        GEN- The patient is elderly appearing, alert and oriented x 3 today.   HEENT: normocephalic, atraumatic; sclera clear, conjunctiva pink; hearing intact; oropharynx clear; neck supple Lungs- Clear to ausculation bilaterally, normal work of breathing.  No wheezes, rales, rhonchi Heart- Irregularly irregular rate and rhythm, no murmurs, rubs or gallops GI- soft, non-tender, non-distended, bowel sounds present Extremities- no clubbing, cyanosis, or edema; DP/PT/radial pulses 2+ bilaterally MS- no significant deformity or atrophy Skin- warm and dry, no rash or lesion Psych- euthymic  mood, full affect Neuro- strength and sensation are intact  Labs:   Lab Results  Component Value Date   WBC 8.8 04/29/2020   HGB 14.6 04/29/2020   HCT 43.8 04/29/2020   MCV 92.2 04/29/2020   PLT 251 04/29/2020    Recent Labs  Lab 04/29/20 0716  NA 140  K 4.1  CL 108  CO2 25  BUN 16  CREATININE 1.01*  CALCIUM 9.3  PROT 6.5  BILITOT 0.7  ALKPHOS 59  ALT 15  AST 21  GLUCOSE 107*      Radiology/Studies: CT HEAD WO CONTRAST  Result Date: 04/27/2020 CLINICAL  DATA:  Transient ischemic attack. Slurred speech and left-sided facial droop. EXAM: CT HEAD WITHOUT CONTRAST TECHNIQUE: Contiguous axial images were obtained from the base of the skull through the vertex without intravenous contrast. COMPARISON:  None. FINDINGS: Brain: No evidence of acute large vascular territory infarct. No acute hemorrhage. No mass lesion or abnormal mass effect. No hydrocephalus. Mild-to-moderate patchy white matter hypoattenuation, likely related to chronic microvascular ischemic disease. Mild to moderate generalized cerebral volume loss. Vascular: Calcific atherosclerosis. No hyperdense vessel identified. Skull: No acute fracture. Sinuses/Orbits: Mild paranasal sinus mucosal thickening. Unremarkable orbits. Other: No mastoid effusions. IMPRESSION: 1. No evidence of acute intracranial abnormality. 2. Mild to moderate chronic microvascular ischemic disease and generalized cerebral volume loss. Electronically Signed   By: Margaretha Sheffield MD   On: 04/27/2020 15:44   CT ANGIO NECK W OR WO CONTRAST  Result Date: 04/28/2020 CLINICAL DATA:  TIA.  Carotid stenosis. EXAM: CT ANGIOGRAPHY NECK TECHNIQUE: Multidetector CT imaging of the neck was performed using the standard protocol during bolus administration of intravenous contrast. Multiplanar CT image reconstructions and MIPs were obtained to evaluate the vascular anatomy. Carotid stenosis measurements (when applicable) are obtained utilizing NASCET criteria, using  the distal internal carotid diameter as the denominator. CONTRAST:  10mL OMNIPAQUE IOHEXOL 350 MG/ML SOLN COMPARISON:  MRA  neck 04/27/2020 FINDINGS: Aortic arch: Moderate atherosclerotic calcification aortic arch without aneurysm or dissection. Atherosclerotic disease without stenosis in the proximal great vessels. Right carotid system: Mild atherosclerotic disease proximal right internal carotid artery without significant stenosis. Tortuosity of the right internal carotid artery Left carotid system: Mild atherosclerotic disease left common carotid artery. Atherosclerotic disease left carotid bifurcation and proximal left internal carotid artery. Minimal luminal diameter of the internal carotid artery 1.8 mm, corresponding to 60% diameter stenosis. Tortuosity of the left internal carotid artery. Vertebral arteries: Both vertebral arteries patent to the basilar without stenosis. Skeleton: Disc degeneration and mild spurring C5-6 and C6-7. No acute skeletal abnormality. Other neck: Negative Upper chest: Lung apices clear bilaterally. IMPRESSION: 1. 60% diameter stenosis proximal left internal carotid artery due to atherosclerotic disease 2. Right carotid artery widely patent without stenosis. Both vertebral arteries widely patent. 3. Atherosclerotic aortic arch. Electronically Signed   By: Franchot Gallo M.D.   On: 04/28/2020 11:24   MR ANGIO HEAD WO CONTRAST  Result Date: 04/27/2020 CLINICAL DATA:  TIA. Episode of slurred speech and facial droop. Left hand weakness. Symptoms lasted 20 minutes. EXAM: MRI HEAD WITHOUT CONTRAST MRA HEAD WITHOUT CONTRAST MRA NECK WITHOUT CONTRAST TECHNIQUE: Multiplanar, multiecho pulse sequences of the brain and surrounding structures were obtained without intravenous contrast. Angiographic images of the Circle of Willis were obtained using MRA technique without intravenous contrast. Angiographic images of the neck were obtained using MRA technique without intravenous contrast.  Carotid stenosis measurements (when applicable) are obtained utilizing NASCET criteria, using the distal internal carotid diameter as the denominator. COMPARISON:  CT head without contrast 04/27/2020 FINDINGS: MRI HEAD FINDINGS Brain: Moderate generalized atrophy and white matter disease is present bilaterally. Asymmetric subcortical white matter changes are present in the right frontal operculum and corona radiata. The ventricles are proportionate to the degree of atrophy. No significant extraaxial fluid collection is present. Basal ganglia are within normal limits. The brainstem and cerebellum are within normal limits. The internal auditory canals are within normal limits. Vascular: Flow is present in the major intracranial arteries. Skull and upper cervical spine: The craniocervical junction is normal. Upper cervical spine is within normal limits. Marrow signal is unremarkable.  Sinuses/Orbits: The paranasal sinuses and mastoid air cells are clear. The globes and orbits are within normal limits. MRA HEAD FINDINGS The internal carotid arteries are within normal limits from the high cervical segments through the ICA termini bilaterally. The A1 and M1 segments are normal. 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 MCA bifurcations are within normal limits. Moderate 10 UA shin of distal MCA branch vessels is present bilaterally without a significant proximal stenosis or occlusion. Vertebral arteries are codominant. Right PICA origin visualized and normal. Left AICA is dominant. Mild narrowing is present in the distal vertebral artery at the vertebrobasilar junction. The basilar artery is normal. Left posterior cerebral artery originates from basilar tip. The right posterior cerebral artery is of fetal type. Attenuation PCA branch vessels is noted bilaterally MRA NECK FINDINGS Time-of-flight images demonstrate a 3 vessel arch configuration. Significant signal loss is noted at the proximal left  internal carotid artery with decreased more distal signal. There is tortuosity of the internal carotid arteries bilaterally. Flow is antegrade in the vertebral arteries bilaterally. IMPRESSION: 1. No acute intracranial abnormality. 2. Moderate generalized atrophy and white matter disease likely reflects the sequela of chronic microvascular ischemia. 3. No significant proximal stenosis, aneurysm, or branch vessel occlusion within the Circle of Willis. 4. 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 5. Moderate distal small vessel disease in the anterior and posterior circulation without other significant proximal stenosis, aneurysm, or branch vessel occlusion. 6. Moderate to high-grade stenosis of the proximal left internal carotid artery suggest on the time-of-flight images. This could be confirmed with carotid Doppler study or CTA. It is contralateral to the symptomatic side. Electronically Signed   By: San Morelle M.D.   On: 04/27/2020 19:49   MR ANGIO NECK WO CONTRAST  Result Date: 04/27/2020 CLINICAL DATA:  TIA. Episode of slurred speech and facial droop. Left hand weakness. Symptoms lasted 20 minutes. EXAM: MRI HEAD WITHOUT CONTRAST MRA HEAD WITHOUT CONTRAST MRA NECK WITHOUT CONTRAST TECHNIQUE: Multiplanar, multiecho pulse sequences of the brain and surrounding structures were obtained without intravenous contrast. Angiographic images of the Circle of Willis were obtained using MRA technique without intravenous contrast. Angiographic images of the neck were obtained using MRA technique without intravenous contrast. Carotid stenosis measurements (when applicable) are obtained utilizing NASCET criteria, using the distal internal carotid diameter as the denominator. COMPARISON:  CT head without contrast 04/27/2020 FINDINGS: MRI HEAD FINDINGS Brain: Moderate generalized atrophy and white matter disease is present bilaterally. Asymmetric subcortical white matter changes are present in the right  frontal operculum and corona radiata. The ventricles are proportionate to the degree of atrophy. No significant extraaxial fluid collection is present. Basal ganglia are within normal limits. The brainstem and cerebellum are within normal limits. The internal auditory canals are within normal limits. Vascular: Flow is present in the major intracranial arteries. Skull and upper cervical spine: The craniocervical junction is normal. Upper cervical spine is within normal limits. Marrow signal is unremarkable. Sinuses/Orbits: The paranasal sinuses and mastoid air cells are clear. The globes and orbits are within normal limits. MRA HEAD FINDINGS The internal carotid arteries are within normal limits from the high cervical segments through the ICA termini bilaterally. The A1 and M1 segments are normal. 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 MCA bifurcations are within normal limits. Moderate 10 UA shin of distal MCA branch vessels is present bilaterally without a significant proximal stenosis or occlusion. Vertebral arteries are codominant. Right PICA origin visualized  and normal. Left AICA is dominant. Mild narrowing is present in the distal vertebral artery at the vertebrobasilar junction. The basilar artery is normal. Left posterior cerebral artery originates from basilar tip. The right posterior cerebral artery is of fetal type. Attenuation PCA branch vessels is noted bilaterally MRA NECK FINDINGS Time-of-flight images demonstrate a 3 vessel arch configuration. Significant signal loss is noted at the proximal left internal carotid artery with decreased more distal signal. There is tortuosity of the internal carotid arteries bilaterally. Flow is antegrade in the vertebral arteries bilaterally. IMPRESSION: 1. No acute intracranial abnormality. 2. Moderate generalized atrophy and white matter disease likely reflects the sequela of chronic microvascular ischemia. 3. No significant proximal  stenosis, aneurysm, or branch vessel occlusion within the Circle of Willis. 4. 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 5. Moderate distal small vessel disease in the anterior and posterior circulation without other significant proximal stenosis, aneurysm, or branch vessel occlusion. 6. Moderate to high-grade stenosis of the proximal left internal carotid artery suggest on the time-of-flight images. This could be confirmed with carotid Doppler study or CTA. It is contralateral to the symptomatic side. Electronically Signed   By: San Morelle M.D.   On: 04/27/2020 19:49   MR BRAIN WO CONTRAST  Result Date: 04/27/2020 CLINICAL DATA:  TIA. Episode of slurred speech and facial droop. Left hand weakness. Symptoms lasted 20 minutes. EXAM: MRI HEAD WITHOUT CONTRAST MRA HEAD WITHOUT CONTRAST MRA NECK WITHOUT CONTRAST TECHNIQUE: Multiplanar, multiecho pulse sequences of the brain and surrounding structures were obtained without intravenous contrast. Angiographic images of the Circle of Willis were obtained using MRA technique without intravenous contrast. Angiographic images of the neck were obtained using MRA technique without intravenous contrast. Carotid stenosis measurements (when applicable) are obtained utilizing NASCET criteria, using the distal internal carotid diameter as the denominator. COMPARISON:  CT head without contrast 04/27/2020 FINDINGS: MRI HEAD FINDINGS Brain: Moderate generalized atrophy and white matter disease is present bilaterally. Asymmetric subcortical white matter changes are present in the right frontal operculum and corona radiata. The ventricles are proportionate to the degree of atrophy. No significant extraaxial fluid collection is present. Basal ganglia are within normal limits. The brainstem and cerebellum are within normal limits. The internal auditory canals are within normal limits. Vascular: Flow is present in the major intracranial arteries. Skull and upper  cervical spine: The craniocervical junction is normal. Upper cervical spine is within normal limits. Marrow signal is unremarkable. Sinuses/Orbits: The paranasal sinuses and mastoid air cells are clear. The globes and orbits are within normal limits. MRA HEAD FINDINGS The internal carotid arteries are within normal limits from the high cervical segments through the ICA termini bilaterally. The A1 and M1 segments are normal. 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 MCA bifurcations are within normal limits. Moderate 10 UA shin of distal MCA branch vessels is present bilaterally without a significant proximal stenosis or occlusion. Vertebral arteries are codominant. Right PICA origin visualized and normal. Left AICA is dominant. Mild narrowing is present in the distal vertebral artery at the vertebrobasilar junction. The basilar artery is normal. Left posterior cerebral artery originates from basilar tip. The right posterior cerebral artery is of fetal type. Attenuation PCA branch vessels is noted bilaterally MRA NECK FINDINGS Time-of-flight images demonstrate a 3 vessel arch configuration. Significant signal loss is noted at the proximal left internal carotid artery with decreased more distal signal. There is tortuosity of the internal carotid arteries bilaterally. Flow is antegrade in the vertebral  arteries bilaterally. IMPRESSION: 1. No acute intracranial abnormality. 2. Moderate generalized atrophy and white matter disease likely reflects the sequela of chronic microvascular ischemia. 3. No significant proximal stenosis, aneurysm, or branch vessel occlusion within the Circle of Willis. 4. 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 5. Moderate distal small vessel disease in the anterior and posterior circulation without other significant proximal stenosis, aneurysm, or branch vessel occlusion. 6. Moderate to high-grade stenosis of the proximal left internal carotid artery suggest  on the time-of-flight images. This could be confirmed with carotid Doppler study or CTA. It is contralateral to the symptomatic side. Electronically Signed   By: San Morelle M.D.   On: 04/27/2020 19:49   ECHOCARDIOGRAM COMPLETE  Result Date: 04/28/2020    ECHOCARDIOGRAM REPORT   Patient Name:   Hudson MATILDA FLEIG Date of Exam: 04/28/2020 Medical Rec #:  [id number]     Height:       67.0 in Accession #:    [id number]    Weight:       180.0 lb Date of Birth:  28-Mar-1933     BSA:          1.934 m Patient Age:    62 years      BP:           156/50 mmHg Patient Gender: F             HR:           54 bpm. Exam Location:  Inpatient Procedure: 2D Echo, 3D Echo, Cardiac Doppler and Color Doppler Indications:    TIA  History:        Patient has prior history of Echocardiogram examinations, most                 recent 02/06/2017. CHF, Abnormal ECG and Prior CABG, TIA,                 Arrythmias:Atrial Fibrillation; Risk Factors:Diabetes. Edema.  Sonographer:    Roseanna Rainbow RDCS Referring Phys: [id number] [place]  Sonographer Comments: Suboptimal parasternal window. IMPRESSIONS  1. Left ventricular ejection fraction, by estimation, is 60 to 65%. The left ventricle has normal function. The left ventricle has no regional wall motion abnormalities. There is severe left ventricular hypertrophy. Left ventricular diastolic parameters  are indeterminate.  2. Right ventricular systolic function is normal. The right ventricular size is normal. There is mildly elevated pulmonary artery systolic pressure.  3. Left atrial size was moderately dilated.  4. The mitral valve is degenerative. Mild mitral valve regurgitation. No evidence of mitral stenosis. Moderate mitral annular calcification.  5. The aortic valve was not well visualized. Aortic valve regurgitation is not visualized. No aortic stenosis is present.  6. Aortic dilatation noted. There is mild dilatation of the ascending aorta, measuring 38 mm.  7. The inferior vena cava  is normal in size with greater than 50% respiratory variability, suggesting right atrial pressure of 3 mmHg. FINDINGS  Left Ventricle: Left ventricular ejection fraction, by estimation, is 60 to 65%. The left ventricle has normal function. The left ventricle has no regional wall motion abnormalities. The left ventricular internal cavity size was normal in size. There is  severe left ventricular hypertrophy. Left ventricular diastolic parameters are indeterminate. Right Ventricle: The right ventricular size is normal. No increase in right ventricular wall thickness. Right ventricular systolic function is normal. There is mildly elevated pulmonary artery systolic pressure. The tricuspid regurgitant velocity is 2.84  m/s, and with an assumed  right atrial pressure of 8 mmHg, the estimated right ventricular systolic pressure is 00.1 mmHg. Left Atrium: Left atrial size was moderately dilated. Right Atrium: Right atrial size was normal in size. Pericardium: There is no evidence of pericardial effusion. Mitral Valve: The mitral valve is degenerative in appearance. There is mild thickening of the mitral valve leaflet(s). There is mild calcification of the mitral valve leaflet(s). Moderate mitral annular calcification. Mild mitral valve regurgitation. No evidence of mitral valve stenosis. Tricuspid Valve: The tricuspid valve is normal in structure. Tricuspid valve regurgitation is not demonstrated. No evidence of tricuspid stenosis. Aortic Valve: The aortic valve was not well visualized. Aortic valve regurgitation is not visualized. No aortic stenosis is present. Pulmonic Valve: The pulmonic valve was normal in structure. Pulmonic valve regurgitation is not visualized. No evidence of pulmonic stenosis. Aorta: The aortic root is normal in size and structure and aortic dilatation noted. There is mild dilatation of the ascending aorta, measuring 38 mm. Venous: The inferior vena cava is normal in size with greater than 50%  respiratory variability, suggesting right atrial pressure of 3 mmHg. IAS/Shunts: No atrial level shunt detected by color flow Doppler.  LEFT VENTRICLE PLAX 2D LVIDd:         3.80 cm     Diastology LVIDs:         2.20 cm     LV e' medial:    6.09 cm/s LV PW:         1.40 cm     LV E/e' medial:  14.3 LV IVS:        2.00 cm     LV e' lateral:   7.40 cm/s LVOT diam:     2.20 cm     LV E/e' lateral: 11.8 LV SV:         94 LV SV Index:   49 LVOT Area:     3.80 cm  LV Volumes (MOD) LV vol d, MOD A2C: 61.0 ml LV vol d, MOD A4C: 66.0 ml LV vol s, MOD A2C: 13.2 ml LV vol s, MOD A4C: 17.7 ml LV SV MOD A2C:     47.8 ml LV SV MOD A4C:     66.0 ml LV SV MOD BP:      48.5 ml RIGHT VENTRICLE            IVC RV S prime:     8.05 cm/s  IVC diam: 2.00 cm TAPSE (M-mode): 1.5 cm LEFT ATRIUM           Index       RIGHT ATRIUM           Index LA diam:      4.30 cm 2.22 cm/m  RA Area:     12.20 cm LA Vol (A2C): 47.9 ml 24.77 ml/m RA Volume:   23.00 ml  11.89 ml/m LA Vol (A4C): 35.3 ml 18.26 ml/m  AORTIC VALVE LVOT Vmax:   93.30 cm/s LVOT Vmean:  66.700 cm/s LVOT VTI:    0.248 m  AORTA Ao Root diam: 3.80 cm Ao Asc diam:  3.80 cm MITRAL VALVE               TRICUSPID VALVE MV Area (PHT): 3.50 cm    TR Peak grad:   32.3 mmHg MV Decel Time: 217 msec    TR Vmax:        284.00 cm/s MV E velocity: 86.95 cm/s MV A velocity: 84.85 cm/s  SHUNTS MV E/A ratio:  1.02  Systemic VTI:  0.25 m                            Systemic Diam: 2.20 cm Jenkins Rouge MD Electronically signed by Jenkins Rouge MD Signature Date/Time: 04/28/2020/9:50:00 AM    Final     EKG: on admission shows sinus bradycardia at 43 bpm with narrow QRS (personally reviewed)  Baseline EKGs 03/24/20 shows NSR 67 bpm 04/12/19 shows sinus brady at 58 bpm, QRS 90s  TELEMETRY: Pt sinus brady 50-60s on arrival until 1045-> went off tele and came back to floor after 1230 with AF RVR in 140s. HRs have gradually improved on diltiazem. AF with rates 60-70s currently, occasional 1.5 -  3 second pauses. 7.5 second pause noted yesterday evening coincided with urination. No BM. No further prolonged pauses. (personally reviewed)   Assessment/Plan: 1.  Paroxysmal AF with RVR and intermittent pauses Rates controlled currently. Discussed with Dr. Rayann Heman. Will hold diltiazem gtt for now and see how she does on Toprol alone.  She has been paroxysmal AF up to this point. EKG on arrival with sinus brady, and EKG 03/24/20 with NSR. ? If she would benefit from antiarrhythmics, but this is complicated by the question of her having "failed" Xarelto. She had "coronary atherosclerosis with calcified plaque seen in the distribution of the left main coronary artery and LA" on CTA 01/2017, otherwise no documented CAD or ischemic symptoms. Could potentially be candidate for flecainide.  She has a relatively narrow QRS and PR interval at baseline. ? If pauses and bradycardia on presentation were in setting of increased vagal tone with TIA.  We did discuss the indications for pacing. Pt would prefer to hold off if possible.    2. TIA  CT Head: No evidence of acute intracranial abnormality. Evidence of mild to moderate chronic microvascular ischemic changes  MRI : no acute intracranial abnormalities  MRA Head and neck questionable moderate to high-grade stenosis of proximal left ICA  CTA neck confirmed 60% stenosis of proximal left ICA, by Dr. Phoebe Sharps review, likely 40% stenosis.  2D Echo: LVEF 60-65% w/ severe L ventricular hypertrophy  LDL 91  HgbA1c 5.7  Xarelto (rivaroxaban) daily prior to admission, Neurology discussed with patient and family will switch to Eliquis (apixaban) daily 5mg  BID.  I personally discussed anticoagulation strategy with Dr. Erlinda Hong. He would be OK with [place] holding a dose of Eliquis pre-pacemaker, and for 2-3 days after if pacing urgently indicated.  He did not think waiting further out from her TIA would put her at a less risk of stroke with holding [place].   ADDENDUM Dr.  Rayann Heman has seen. Stop diltiazem gtt with now controlled rates.  Reasonable candidate for flecainide, complicated by the question of "failing" Xarelto. If she converts back to NSR on her own, would be reasonable to try low dose flecainide to see if this would hold her in rhythm.  Will also make NPO after midnight in the event she has further significant pauses and pacing needs to be re-addressed in am.  For questions or updates, please contact [place] Please consult [email or web address] for contact info under Cardiology/STEMI.  Signed, Shirley Friar, PA-C  04/29/2020 12:47 PM   I have seen, examined the patient, and reviewed the above assessment and plan.  Changes to above are made where necessary.  On exam, iRRR.   The patient has no prior history of symptomatic bradycardia.  In the setting of TIA, she has  been observed to have transient pauses.  The episode in the bathroom was clearly vagally mediated. Will stop diltiazem and continue to observe.  Start flecainide if she converts to sinus rhythm in hopes to avoid RVR going forward.  If she continues to have pauses off of cardizem,, we may need to consider pacing.  Dr Caryl Comes to see in am.  Co Sign: Thompson Grayer, MD

## 2020-04-29 NOTE — Progress Notes (Signed)
Physical Therapy Treatment Patient Details Name: Amber Kennedy MRN: [id number] DOB: 01/05/1934 Today's Date: 04/29/2020    History of Present Illness Pt is an 87 y/o female who presents after experiencing slurred speech, L hand weakness, and facial droop per family report for 20-30 minutes. Symptoms improved by the time EMS arrived. Pt arrived to the ED 04/27/20 and MRI negative for acute stroke. PMH significant for vision loss, hyponatremia, hypokalemia, HTN, gout, a-fib.    PT Comments    Pt tolerates treatment well, ambulating for increased distances this session. Pt does tend to drift laterally often during session, reporting some imbalance from receiving ativan earlier in the day. Pt is able to negotiate stairs with assistance for safety. Pt will benefit from further acute PT services to assess dynamic gait tasks and to determine if drift is related to medication or is an underlying balance/mobility deficit. PT continues to recommend outpatient PT services at this time despite pt's report of refusal.   Follow Up Recommendations  Outpatient PT;Supervision for mobility/OOB (pt reports she is declining OPPT as she has a granddaughter who is a PT and can assist)     Equipment Recommendations  None recommended by PT    Recommendations for Other Services       Precautions / Restrictions Precautions Precautions: Fall Precaution Comments: reports instability 2/2 receiving Ativan Restrictions Weight Bearing Restrictions: No    Mobility  Bed Mobility Overal bed mobility: Modified Independent             General bed mobility comments: increased time    Transfers Overall transfer level: Needs assistance Equipment used: None Transfers: Sit to/from Stand Sit to Stand: Supervision            Ambulation/Gait Ambulation/Gait assistance: Min guard Gait Distance (Feet): 600 Feet Assistive device: None Gait Pattern/deviations: Step-through pattern;Drifts right/left Gait  velocity: reduced Gait velocity interpretation: <1.8 ft/sec, indicate of risk for recurrent falls General Gait Details: pt with intermittent drift to left and right side, pt reports this is 2/2 Ativan   Stairs Stairs: Yes Stairs assistance: Min guard Stair Management: Two rails;Step to pattern Number of Stairs: 5     Wheelchair Mobility    Modified Rankin (Stroke Patients Only)       Balance Overall balance assessment: Needs assistance Sitting-balance support: No upper extremity supported;Feet supported Sitting balance-Leahy Scale: Good     Standing balance support: No upper extremity supported;During functional activity Standing balance-Leahy Scale: Fair                              Cognition Arousal/Alertness: Awake/alert Behavior During Therapy: WFL for tasks assessed/performed                                   General Comments: likely near baseline, tangential thought at times during session      Exercises      General Comments General comments (skin integrity, edema, etc.): pt with A-fib rhythm noted on monitor, tachycardic up to 134 during mobility. HR returns to 90s once seated and resting at edge of bed      Pertinent Vitals/Pain Pain Assessment: No/denies pain    Home Living                      Prior Function            PT  Goals (current goals can now be found in the care plan section) Acute Rehab PT Goals Patient Stated Goal: Home ASAP Progress towards PT goals: Progressing toward goals    Frequency    Min 4X/week      PT Plan Current plan remains appropriate    Co-evaluation              AM-PAC PT "6 Clicks" Mobility   Outcome Measure  Help needed turning from your back to your side while in a flat bed without using bedrails?: None Help needed moving from lying on your back to sitting on the side of a flat bed without using bedrails?: None Help needed moving to and from a bed to a chair  (including a wheelchair)?: A Little Help needed standing up from a chair using your arms (e.g., wheelchair or bedside chair)?: A Little Help needed to walk in hospital room?: A Little Help needed climbing 3-5 steps with a railing? : A Little 6 Click Score: 20    End of Session Equipment Utilized During Treatment: Gait belt Activity Tolerance: Patient tolerated treatment well Patient left: in bed;with call bell/phone within reach;with bed alarm set Nurse Communication: Mobility status PT Visit Diagnosis: Unsteadiness on feet (R26.81);Other symptoms and signs involving the nervous system (R29.898)     Time: 9450-3888 PT Time Calculation (min) (ACUTE ONLY): 23 min  Charges:  $Gait Training: 8-22 mins $Therapeutic Activity: 8-22 mins                     Zenaida Niece, PT, DPT Acute Rehabilitation Pager: [phone]    Zenaida Niece 04/29/2020, 4:11 PM

## 2020-04-30 ENCOUNTER — Inpatient Hospital Stay (HOSPITAL_COMMUNITY)
Admission: EM | Disposition: A | Discharge status: Home / Self Care | Payer: Self-pay | Source: Home / Self Care | Attending: Internal Medicine

## 2020-04-30 DIAGNOSIS — Z950 Presence of cardiac pacemaker: Secondary | ICD-10-CM

## 2020-04-30 DIAGNOSIS — I480 Paroxysmal atrial fibrillation: Secondary | ICD-10-CM

## 2020-04-30 HISTORY — DX: Presence of cardiac pacemaker: Z95.0

## 2020-04-30 HISTORY — PX: PACEMAKER IMPLANT: EP1218

## 2020-04-30 LAB — COMPREHENSIVE METABOLIC PANEL
ALT: 14 U/L (ref 0–44)
AST: 31 U/L (ref 15–41)
Albumin: 3 g/dL — ABNORMAL LOW (ref 3.5–5.0)
Alkaline Phosphatase: 48 U/L (ref 38–126)
Anion gap: 4 — ABNORMAL LOW (ref 5–15)
BUN: 19 mg/dL (ref 8–23)
CO2: 25 mmol/L (ref 22–32)
Calcium: 8.6 mg/dL — ABNORMAL LOW (ref 8.9–10.3)
Chloride: 110 mmol/L (ref 98–111)
Creatinine, Ser: 1.09 mg/dL — ABNORMAL HIGH (ref 0.44–1.00)
GFR, Estimated: 50 mL/min — ABNORMAL LOW (ref >60)
Glucose, Bld: 88 mg/dL (ref 70–99)
Potassium: 4.7 mmol/L (ref 3.5–5.1)
Sodium: 139 mmol/L (ref 135–145)
Total Bilirubin: 1.2 mg/dL (ref 0.3–1.2)
Total Protein: 5.6 g/dL — ABNORMAL LOW (ref 6.5–8.1)

## 2020-04-30 LAB — CBC WITH DIFFERENTIAL/PLATELET
Abs Immature Granulocytes: 0.03 10*3/uL (ref 0.00–0.07)
Basophils Absolute: 0 10*3/uL (ref 0.0–0.1)
Basophils Relative: 0 %
Eosinophils Absolute: 0.2 10*3/uL (ref 0.0–0.5)
Eosinophils Relative: 3 %
HCT: 38.4 % (ref 36.0–46.0)
Hemoglobin: 12.5 g/dL (ref 12.0–15.0)
Immature Granulocytes: 0 %
Lymphocytes Relative: 39 %
Lymphs Abs: 2.7 10*3/uL (ref 0.7–4.0)
MCH: 30.5 pg (ref 26.0–34.0)
MCHC: 32.6 g/dL (ref 30.0–36.0)
MCV: 93.7 fL (ref 80.0–100.0)
Monocytes Absolute: 0.5 10*3/uL (ref 0.1–1.0)
Monocytes Relative: 6 %
Neutro Abs: 3.6 10*3/uL (ref 1.7–7.7)
Neutrophils Relative %: 52 %
Platelets: 207 10*3/uL (ref 150–400)
RBC: 4.1 MIL/uL (ref 3.87–5.11)
RDW: 12.8 % (ref 11.5–15.5)
WBC: 7 10*3/uL (ref 4.0–10.5)
nRBC: 0 % (ref 0.0–0.2)

## 2020-04-30 LAB — MRSA PCR SCREENING: MRSA by PCR: NEGATIVE

## 2020-04-30 SURGERY — PACEMAKER IMPLANT

## 2020-04-30 MED ORDER — CHLORHEXIDINE GLUCONATE 4 % EX LIQD
60.0000 mL | Freq: Once | CUTANEOUS | Status: DC
  Filled 2020-04-30: qty 60

## 2020-04-30 MED ORDER — HEPARIN (PORCINE) IN NACL 1000-0.9 UT/500ML-% IV SOLN
INTRAVENOUS | Status: DC | PRN
  Administered 2020-04-30: 500 mL

## 2020-04-30 MED ORDER — VANCOMYCIN HCL IN DEXTROSE 1-5 GM/200ML-% IV SOLN
INTRAVENOUS | Status: AC
  Filled 2020-04-30: qty 200

## 2020-04-30 MED ORDER — APIXABAN 5 MG PO TABS
5.0000 mg | ORAL_TABLET | Freq: Two times a day (BID) | ORAL | Status: DC
  Administered 2020-05-01 – 2020-05-02 (×2): 5 mg via ORAL
  Filled 2020-04-30 (×2): qty 1

## 2020-04-30 MED ORDER — LABETALOL HCL 5 MG/ML IV SOLN
INTRAVENOUS | Status: AC
  Filled 2020-04-30: qty 4

## 2020-04-30 MED ORDER — SODIUM CHLORIDE 0.9 % IV SOLN
80.0000 mg | INTRAVENOUS | Status: AC
  Administered 2020-04-30: 80 mg
  Filled 2020-04-30: qty 2

## 2020-04-30 MED ORDER — SODIUM CHLORIDE 0.9 % IV SOLN: INTRAVENOUS | Status: DC

## 2020-04-30 MED ORDER — LIDOCAINE HCL (PF) 1 % IJ SOLN
INTRAMUSCULAR | Status: DC | PRN
  Administered 2020-04-30: 60 mL

## 2020-04-30 MED ORDER — VANCOMYCIN HCL IN DEXTROSE 1-5 GM/200ML-% IV SOLN
1000.0000 mg | INTRAVENOUS | Status: AC
  Administered 2020-04-30: 1000 mg via INTRAVENOUS

## 2020-04-30 MED ORDER — ONDANSETRON HCL 4 MG/2ML IJ SOLN: 4.0000 mg | Freq: Four times a day (QID) | INTRAMUSCULAR | Status: DC | PRN

## 2020-04-30 MED ORDER — VANCOMYCIN HCL 1000 MG/200ML IV SOLN
1000.0000 mg | Freq: Two times a day (BID) | INTRAVENOUS | Status: AC
  Administered 2020-05-01: 1000 mg via INTRAVENOUS
  Filled 2020-04-30: qty 200

## 2020-04-30 MED ORDER — FLECAINIDE ACETATE 50 MG PO TABS
50.0000 mg | ORAL_TABLET | Freq: Two times a day (BID) | ORAL | Status: DC
  Administered 2020-04-30 – 2020-05-02 (×5): 50 mg via ORAL
  Filled 2020-04-30 (×5): qty 1

## 2020-04-30 MED ORDER — DILTIAZEM HCL ER COATED BEADS 180 MG PO CP24
180.0000 mg | ORAL_CAPSULE | Freq: Every day | ORAL | Status: DC
  Administered 2020-04-30: 180 mg via ORAL
  Filled 2020-04-30: qty 1

## 2020-04-30 MED ORDER — HEPARIN (PORCINE) IN NACL 1000-0.9 UT/500ML-% IV SOLN
INTRAVENOUS | Status: AC
  Filled 2020-04-30: qty 500

## 2020-04-30 MED ORDER — APIXABAN 2.5 MG PO TABS
2.5000 mg | ORAL_TABLET | Freq: Two times a day (BID) | ORAL | Status: AC
  Administered 2020-04-30 – 2020-05-01 (×2): 2.5 mg via ORAL
  Filled 2020-04-30 (×2): qty 1

## 2020-04-30 MED ORDER — SODIUM CHLORIDE 0.9 % IV SOLN
INTRAVENOUS | Status: AC
  Filled 2020-04-30: qty 2

## 2020-04-30 MED ORDER — ACETAMINOPHEN 325 MG PO TABS: 325.0000 mg | ORAL_TABLET | ORAL | Status: DC | PRN

## 2020-04-30 MED ORDER — DILTIAZEM HCL 90 MG PO TABS
90.0000 mg | ORAL_TABLET | Freq: Four times a day (QID) | ORAL | Status: AC
  Administered 2020-04-30: 90 mg via ORAL
  Filled 2020-04-30: qty 1

## 2020-04-30 MED ORDER — LIDOCAINE HCL (PF) 1 % IJ SOLN
INTRAMUSCULAR | Status: AC
  Filled 2020-04-30: qty 60

## 2020-04-30 SURGICAL SUPPLY — 8 items
CABLE SURGICAL S-101-97-12 (CABLE) ×2 IMPLANT
HEMOSTAT SURGICEL 2X4 FIBR (HEMOSTASIS) ×2 IMPLANT
LEAD TENDRIL MRI 52CM LPA1200M (Lead) ×2 IMPLANT
LEAD TENDRIL MRI 58CM LPA1200M (Lead) ×2 IMPLANT
PACEMAKER ASSURITY DR-RF (Pacemaker) ×2 IMPLANT
PAD PRO RADIOLUCENT 2001M-C (PAD) ×2 IMPLANT
SHEATH 8FR PRELUDE SNAP 13 (SHEATH) ×4 IMPLANT
TRAY PACEMAKER INSERTION (PACKS) ×2 IMPLANT

## 2020-04-30 NOTE — Progress Notes (Addendum)
Electrophysiology Rounding Note  Patient Name: Amber Kennedy Date of Encounter: 04/30/2020  Primary Cardiologist: Candee Furbish, MD Electrophysiologist: New   Subjective   The patient is doing well today.  At this time, the patient denies chest pain, shortness of breath, or any new concerns.  She was asymptomatic with conversion to NSR and pauses last night.   Inpatient Medications    Scheduled Meds: .  stroke: mapping our early stages of recovery book   Does not apply Once  . flecainide  50 mg Oral Q12H  . metoprolol succinate  100 mg Oral Daily   Continuous Infusions: . sodium chloride 125 mL/hr at 04/29/20 1651   PRN Meds: acetaminophen **OR** acetaminophen (TYLENOL) oral liquid 160 mg/5 mL **OR** acetaminophen, ALPRAZolam, senna-docusate   Vital Signs    Vitals:   04/29/20 2341 04/30/20 0341 04/30/20 0839 04/30/20 0850  BP: (!) 164/59 (!) 154/60 (!) 194/77 (!) 186/79  Pulse: 70 60 68 63  Resp: 19 17  18   Temp: 98 F (36.7 C) 97.8 F (36.6 C)  98.2 F (36.8 C)  TempSrc: Oral Oral  Oral  SpO2: 98% 100%  100%  Weight:        Intake/Output Summary (Last 24 hours) at 04/30/2020 0954 Last data filed at 04/30/2020 0800 Gross per 24 hour  Intake 2814.65 ml  Output --  Net 2814.65 ml   Filed Weights   04/28/20 1400  Weight: 84 kg    Physical Exam    GEN- The patient is well appearing, alert and oriented x 3 today.   Head- normocephalic, atraumatic Eyes-  Sclera clear, conjunctiva pink Ears- hearing intact Oropharynx- clear Neck- supple Lungs- Clear to ausculation bilaterally, normal work of breathing Heart- Regular rate and rhythm, no murmurs, rubs or gallops GI- soft, NT, ND, + BS Extremities- no clubbing or cyanosis. No edema Skin- no rash or lesion Psych- euthymic mood, full affect Neuro- strength and sensation are intact  Labs    CBC Recent Labs    04/29/20 0716 04/30/20 0504  WBC 8.8 7.0  NEUTROABS 5.0 3.6  HGB 14.6 12.5  HCT 43.8 38.4   MCV 92.2 93.7  PLT 251 127   Basic Metabolic Panel Recent Labs    04/28/20 1811 04/29/20 0716 04/30/20 0504  NA  --  140 139  K  --  4.1 4.7  CL  --  108 110  CO2  --  25 25  GLUCOSE  --  107* 88  BUN  --  16 19  CREATININE  --  1.01* 1.09*  CALCIUM  --  9.3 8.6*  MG 2.0 2.0  --    Liver Function Tests Recent Labs    04/29/20 0716 04/30/20 0504  AST 21 31  ALT 15 14  ALKPHOS 59 48  BILITOT 0.7 1.2  PROT 6.5 5.6*  ALBUMIN 3.4* 3.0*   No results for input(s): LIPASE, AMYLASE in the last 72 hours. Cardiac Enzymes No results for input(s): CKTOTAL, CKMB, CKMBINDEX, TROPONINI in the last 72 hours.   Telemetry    Converted to NSR just after 1830 with a 7+ second post conversion pause. She had numerous 3-5 second pauses while in sinus for the next hour, which gradually subsided. She has now been >12 hours without a pause. (personally reviewed)  Radiology    CT ANGIO NECK W OR WO CONTRAST  Result Date: 04/28/2020 CLINICAL DATA:  TIA.  Carotid stenosis. EXAM: CT ANGIOGRAPHY NECK TECHNIQUE: Multidetector CT imaging of the neck  was performed using the standard protocol during bolus administration of intravenous contrast. Multiplanar CT image reconstructions and MIPs were obtained to evaluate the vascular anatomy. Carotid stenosis measurements (when applicable) are obtained utilizing NASCET criteria, using the distal internal carotid diameter as the denominator. CONTRAST:  5mL OMNIPAQUE IOHEXOL 350 MG/ML SOLN COMPARISON:  MRA  neck 04/27/2020 FINDINGS: Aortic arch: Moderate atherosclerotic calcification aortic arch without aneurysm or dissection. Atherosclerotic disease without stenosis in the proximal great vessels. Right carotid system: Mild atherosclerotic disease proximal right internal carotid artery without significant stenosis. Tortuosity of the right internal carotid artery Left carotid system: Mild atherosclerotic disease left common carotid artery. Atherosclerotic disease  left carotid bifurcation and proximal left internal carotid artery. Minimal luminal diameter of the internal carotid artery 1.8 mm, corresponding to 60% diameter stenosis. Tortuosity of the left internal carotid artery. Vertebral arteries: Both vertebral arteries patent to the basilar without stenosis. Skeleton: Disc degeneration and mild spurring C5-6 and C6-7. No acute skeletal abnormality. Other neck: Negative Upper chest: Lung apices clear bilaterally. IMPRESSION: 1. 60% diameter stenosis proximal left internal carotid artery due to atherosclerotic disease 2. Right carotid artery widely patent without stenosis. Both vertebral arteries widely patent. 3. Atherosclerotic aortic arch. Electronically Signed   By: Franchot Gallo M.D.   On: 04/28/2020 11:24    Patient Profile     Amber Kennedy is a 87 y.o. 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 Virgina Jock.  Assessment & Plan    1.  Paroxysmal AF with RVR and intermittent pauses Sinus this am, with conversion last night and multiple subsequent pauses.  Flecainide 50 mg BID started.  Continue toprol 100 mg daily. Follow rates.  She continued to have pauses after converting to sinus, raising higher the concern for need of pacing. Will discuss with family and Dr. Caryl Comes this am. She has not had non-triggered syncope in the past.  Pt is open to the idea of pacing or watchful waiting. Will further discuss with MD.  She is NPO and am dose of Eliquis held in the event she is paced today.    2. TIA  CT Head:No evidence of acute intracranial abnormality.Evidence of mild to moderate chronic microvascular ischemic changes  MRI: no acute intracranial abnormalities  MRAHead and neckquestionable moderate to high-grade stenosis of proximal left ICA  CTA neckconfirmed 60% stenosis of proximal leftICA, by Dr. Phoebe Sharps review, likely 40% stenosis.  2D Echo: LVEF  60-65% w/ severe L ventricular hypertrophy  LDL91  HgbA1c5.7  Xarelto (rivaroxaban) dailyprior to admission, Neurology discussed with patient and family will switch toEliquis (apixaban) daily5mg  BID.  I personally discussed anticoagulation strategy with Dr. Erlinda Hong. He would be OK with [place] holding a dose of Eliquis pre-pacemaker, and for 2-3 days after if pacing urgently indicated.  He did not think waiting further out from her TIA would put her at a less risk of stroke with holding [place].   Pending family discussion with Dr. Caryl Comes. NPO and eliquis held for now.   For questions or updates, please contact [place] Please consult [email or web address] for contact info under Cardiology/STEMI.  Signed, Shirley Friar, PA-C  04/30/2020, 9:54 AM   Tel review >> multiple atrial fib episodes with post termination pauses, sometime with immediate resumption of afib, others with sinus or junctional  It is not clear to me that these post termination pauses have not been previously present as she  has had non positionally dependent very transient LH/PS  Afib with Rapid rates and with concomitant HTN Rx with BB and/or CCB will then be possible following pacing  Hence have recommended pacing  The benefits and risks were reviewed including but not limited to death,  perforation, infection, lead dislodgement and device malfunction.  The patient understands agrees and is willing to proceed.  The need for ongoing anticoagulation will increase the risk of pocket hematoma and will use pressure pal and "cheat" and use Apixoban  2.5 mg for 24 hrs to hopefully avoid bleeding

## 2020-04-30 NOTE — Progress Notes (Signed)
Subjective:  Feels well. No complaints  >3sec pauses on telemetry, post conversion pause, as well as sinus arrest.  Objective:  Vital Signs in the last 24 hours: Temp:  [97.8 F (36.6 C)-98.2 F (36.8 C)] 98.2 F (36.8 C) (04/06 0850) Pulse Rate:  [58-72] 63 (04/06 0850) Resp:  [17-19] 18 (04/06 0850) BP: (112-194)/(59-79) 186/79 (04/06 0850) SpO2:  [98 %-100 %] 100 % (04/06 0850)  Intake/Output from previous day: 04/05 0701 - 04/06 0700 In: 1814.5 [P.O.:240; I.V.:1574.5] Out: -   Physical Exam Vitals and nursing note reviewed.  Constitutional:      General: She is not in acute distress.    Appearance: She is well-developed.  HENT:     Head: Normocephalic and atraumatic.  Eyes:     Conjunctiva/sclera: Conjunctivae normal.     Pupils: Pupils are equal, round, and reactive to light.  Neck:     Vascular: No JVD.  Cardiovascular:     Rate and Rhythm: Normal rate. Rhythm irregular.     Pulses: Normal pulses and intact distal pulses.     Heart sounds: No murmur heard.   Pulmonary:     Effort: Pulmonary effort is normal.     Breath sounds: Normal breath sounds. No wheezing or rales.  Abdominal:     General: Bowel sounds are normal.     Palpations: Abdomen is soft.     Tenderness: There is no rebound.  Musculoskeletal:        General: No tenderness. Normal range of motion.     Left lower leg: No edema.  Lymphadenopathy:     Cervical: No cervical adenopathy.  Skin:    General: Skin is warm and dry.  Neurological:     Mental Status: She is alert and oriented to person, place, and time.     Cranial Nerves: No cranial nerve deficit.      Lab Results: BMP Recent Labs    04/28/20 0934 04/29/20 0716 04/30/20 0504  NA 138 140 139  K 3.9 4.1 4.7  CL 106 108 110  CO2 25 25 25   GLUCOSE 100* 107* 88  BUN 22 16 19   CREATININE 1.05* 1.01* 1.09*  CALCIUM 9.1 9.3 8.6*  GFRNONAA 52* 55* 50*    CBC Recent Labs  Lab 04/30/20 0504  WBC 7.0  RBC 4.10  HGB 12.5   HCT 38.4  PLT 207  MCV 93.7  MCH 30.5  MCHC 32.6  RDW 12.8  LYMPHSABS 2.7  MONOABS 0.5  EOSABS 0.2  BASOSABS 0.0    HEMOGLOBIN A1C Lab Results  Component Value Date   HGBA1C 5.7 (H) 04/28/2020   MPG 116.89 04/28/2020     TSH Recent Labs    04/27/20 2259 04/28/20 1811  TSH 3.836 3.057    Lipid Panel     Component Value Date/Time   CHOL 152 04/28/2020 0934   TRIG 143 04/28/2020 0934   HDL 32 (L) 04/28/2020 0934   CHOLHDL 4.8 04/28/2020 0934   VLDL 29 04/28/2020 0934   LDLCALC 91 04/28/2020 0934     Hepatic Function Panel Recent Labs    04/28/20 0934 04/29/20 0716 04/30/20 0504  PROT 6.0* 6.5 5.6*  ALBUMIN 3.2* 3.4* 3.0*  AST 19 21 31   ALT 15 15 14   ALKPHOS 51 59 48  BILITOT 0.6 0.7 1.2    CTA nexk 04/28/2020: 1. 60% diameter stenosis proximal left internal carotid artery due to atherosclerotic disease 2. Right carotid artery widely patent without stenosis. Both vertebral arteries widely  patent. 3. Atherosclerotic aortic arch.    Cardiac Studies:  EKG 04/29/2020: Atrial flutter with variable AV conduction, ventricular rate 68 bpm  Echocardiogram 04/28/2020: 1. Left ventricular ejection fraction, by estimation, is 60 to 65%. The  left ventricle has normal function. The left ventricle has no regional  wall motion abnormalities. There is severe left ventricular hypertrophy.  Left ventricular diastolic parameters  are indeterminate.  2. Right ventricular systolic function is normal. The right ventricular  size is normal. There is mildly elevated pulmonary artery systolic  pressure.  3. Left atrial size was moderately dilated.  4. The mitral valve is degenerative. Mild mitral valve regurgitation. No  evidence of mitral stenosis. Moderate mitral annular calcification.  5. The aortic valve was not well visualized. Aortic valve regurgitation  is not visualized. No aortic stenosis is present.  6. Aortic dilatation noted. There is mild  dilatation of the ascending  aorta, measuring 38 mm.  7. The inferior vena cava is normal in size with greater than 50%  respiratory variability, suggesting right atrial pressure of 3 mmHg.   Assessment & Recommendations:  87 y.o. Caucasian female with hypertension, hyperlipidemia, paroxysmal atrial fibrillation, TIA  TIA: Management as per Neurology.  CHA2DS2VASc score 6. Annual stroke risk 9%. Xarelto failure, switched to eliquis. Will need outpatient vascular surgery evaluation for symptomatic mod Lt IC stenosis  PAF: Afib/flutter with VR 30-140 Currently in sinus rhythm. On flecainide and metoprolol. EP following re: pacemaker dicussion for tachy brady syndrome   Nigel Mormon, MD Pager: [phone] Office: [phone]

## 2020-04-30 NOTE — Plan of Care (Signed)
  Problem: Education: Goal: Knowledge of General Education information will improve Description: Including pain rating scale, medication(s)/side effects and non-pharmacologic comfort measures Outcome: Progressing   Problem: Health Behavior/Discharge Planning: Goal: Ability to manage health-related needs will improve Outcome: Progressing   Problem: Clinical Measurements: Goal: Ability to maintain clinical measurements within normal limits will improve Outcome: Progressing Goal: Will remain free from infection Outcome: Progressing Goal: Diagnostic test results will improve Outcome: Progressing Goal: Respiratory complications will improve Outcome: Progressing Goal: Cardiovascular complication will be avoided Outcome: Progressing   Problem: Activity: Goal: Risk for activity intolerance will decrease Outcome: Progressing   Problem: Nutrition: Goal: Adequate nutrition will be maintained Outcome: Progressing   Problem: Coping: Goal: Level of anxiety will decrease Outcome: Progressing   Problem: Elimination: Goal: Will not experience complications related to bowel motility Outcome: Progressing Goal: Will not experience complications related to urinary retention Outcome: Progressing   Problem: Pain Managment: Goal: General experience of comfort will improve Outcome: Progressing   Problem: Safety: Goal: Ability to remain free from injury will improve Outcome: Progressing   Problem: Skin Integrity: Goal: Risk for impaired skin integrity will decrease Outcome: Progressing   Problem: Education: Goal: Knowledge of disease or condition will improve Outcome: Progressing Goal: Knowledge of secondary prevention will improve Outcome: Progressing Goal: Knowledge of patient specific risk factors addressed and post discharge goals established will improve Outcome: Progressing Goal: Individualized Educational Video(s) Outcome: Progressing   Problem: Health Behavior/Discharge  Planning: Goal: Ability to manage health-related needs will improve Outcome: Progressing   Problem: Self-Care: Goal: Ability to participate in self-care as condition permits will improve Outcome: Progressing   Problem: Nutrition: Goal: Risk of aspiration will decrease Outcome: Progressing Goal: Dietary intake will improve Outcome: Progressing

## 2020-04-30 NOTE — Progress Notes (Signed)
PT Cancellation Note  Patient Details Name: Amber Kennedy MRN: [id number] DOB: [birth year]   Cancelled Treatment:    Reason Eval/Treat Not Completed: Other (comment) receiving pacemaker this afternoon- will hold for now and check back tomorrow.    Windell Norfolk, DPT, PN1   Supplemental Physical Therapist [facility]    Pager [phone] Acute Rehab Office [phone]

## 2020-04-30 NOTE — Progress Notes (Signed)
PROGRESS NOTE    Amber Kennedy  VWU:[id number] DOB: 13-Apr-1933 DOA: 04/27/2020 PCP: [name], MD    Chief Complaint  Patient presents with  . TIA symptoms    Brief Narrative:   87 year old lady with prior history of atrial fibrillation, diastolic heart failure, gout presents to ED for slurring speech, facial droop and left hand clumsiness.  CT on admit shows chronic microvascular ischemic changes.  Neurology consulted, suggested that patient had a small vessel TIA.  MRI done and reviewed with the patient.  Assessment & Plan:   Active Problems:   Essential hypertension   Paroxysmal atrial fibrillation (HCC)   TIA (transient ischemic attack)   Bradycardia   Carotid stenosis, left   TIA:  Asymptomatic. Work up done.  Therapy evaluations recommending outpatient PT.    Atrial fibrillation/tachybradycardia syndrome Patient's CHA2DS2-VASc score more than 4 patient currently on flecainide and Toprol EP on board and discussions for PPM. Sinus pauses on telemetry   Chronic diastolic heart failure Patient appears to be compensated at this time.     Essential hypertension Blood pressure parameters appear to be optimal.   DVT prophylaxis: scd's Code Status: full code.  Family Communication: daughter at bedside.  Disposition:   Status is: Inpatient  Remains inpatient appropriate because:Ongoing diagnostic testing needed not appropriate for outpatient work up   Dispo: The patient is from: Home              Anticipated d/c is to: Home              Patient currently is not medically stable to d/c.   Difficult to place patient No       Consultants:   EP  Cardiology.    Procedures: none.   Antimicrobials: none.   Subjective: No new complaints.   Objective: Vitals:   04/29/20 2341 04/30/20 0341 04/30/20 0839 04/30/20 0850  BP: (!) 164/59 (!) 154/60 (!) 194/77 (!) 186/79  Pulse: 70 60 68 63  Resp: 19 17  18   Temp: 98 F (36.7 C) 97.8 F (36.6  C)  98.2 F (36.8 C)  TempSrc: Oral Oral  Oral  SpO2: 98% 100%  100%  Weight:        Intake/Output Summary (Last 24 hours) at 04/30/2020 1114 Last data filed at 04/30/2020 0800 Gross per 24 hour  Intake 2814.65 ml  Output --  Net 2814.65 ml   Filed Weights   04/28/20 1400  Weight: 84 kg    Examination:  General exam: Appears calm and comfortable  Respiratory system: Clear to auscultation. Respiratory effort normal. Cardiovascular system: S1 & S2 heard, RRR. No JVD, . No pedal edema. Gastrointestinal system: Abdomen is nondistended, soft and nontender.  Normal bowel sounds heard. Central nervous system: Alert and oriented. No focal neurological deficits. Extremities: Symmetric 5 x 5 power. Skin: No rashes, lesions or ulcers Psychiatry: . Mood & affect appropriate.     Data Reviewed: I have personally reviewed following labs and imaging studies  CBC: Recent Labs  Lab 04/27/20 1408 04/29/20 0716 04/30/20 0504  WBC 9.2 8.8 7.0  NEUTROABS 6.5 5.0 3.6  HGB 14.6 14.6 12.5  HCT 44.8 43.8 38.4  MCV 93.7 92.2 93.7  PLT 272 251 295    Basic Metabolic Panel: Recent Labs  Lab 04/27/20 1408 04/28/20 0934 04/28/20 1811 04/29/20 0716 04/30/20 0504  NA 138 138  --  140 139  K 4.7 3.9  --  4.1 4.7  CL 106 106  --  108  110  CO2 23 25  --  25 25  GLUCOSE 120* 100*  --  107* 88  BUN 20 22  --  16 19  CREATININE 1.23* 1.05*  --  1.01* 1.09*  CALCIUM 9.6 9.1  --  9.3 8.6*  MG  --   --  2.0 2.0  --     GFR: Estimated Creatinine Clearance: 42.1 mL/min (A) (by C-G formula based on SCr of 1.09 mg/dL (H)).  Liver Function Tests: Recent Labs  Lab 04/27/20 1408 04/28/20 0934 04/29/20 0716 04/30/20 0504  AST 19 19 21 31   ALT 16 15 15 14   ALKPHOS 63 51 59 48  BILITOT 0.7 0.6 0.7 1.2  PROT 7.0 6.0* 6.5 5.6*  ALBUMIN 3.8 3.2* 3.4* 3.0*    CBG: No results for input(s): GLUCAP in the last 168 hours.   Recent Results (from the past 240 hour(s))  Resp Panel by RT-PCR  (Flu A&B, Covid) Nasopharyngeal Swab     Status: None   Collection Time: 04/27/20  2:35 PM   Specimen: Nasopharyngeal Swab; Nasopharyngeal(NP) swabs in vial transport medium  Result Value Ref Range Status   SARS Coronavirus 2 by RT PCR NEGATIVE NEGATIVE Final    Comment: (NOTE) SARS-CoV-2 target nucleic acids are NOT DETECTED.  The SARS-CoV-2 RNA is generally detectable in upper respiratory specimens during the acute phase of infection. The lowest concentration of SARS-CoV-2 viral copies this assay can detect is 138 copies/mL. A negative result does not preclude SARS-Cov-2 infection and should not be used as the sole basis for treatment or other patient management decisions. A negative result may occur with  improper specimen collection/handling, submission of specimen other than nasopharyngeal swab, presence of viral mutation(s) within the areas targeted by this assay, and inadequate number of viral copies(<138 copies/mL). A negative result must be combined with clinical observations, patient history, and epidemiological information. The expected result is Negative.  Fact Sheet for Patients:  EntrepreneurPulse.com.au  Fact Sheet for Healthcare Providers:  IncredibleEmployment.be  This test is no t yet approved or cleared by the [place] FDA and  has been authorized for detection and/or diagnosis of SARS-CoV-2 by FDA under an Emergency Use Authorization (EUA). This EUA will remain  in effect (meaning this test can be used) for the duration of the COVID-19 declaration under Section 564(b)(1) of the Act, 21 U.S.C.section 360bbb-3(b)(1), unless the authorization is terminated  or revoked sooner.       Influenza A by PCR NEGATIVE NEGATIVE Final   Influenza B by PCR NEGATIVE NEGATIVE Final    Comment: (NOTE) The Xpert Xpress SARS-CoV-2/FLU/RSV plus assay is intended as an aid in the diagnosis of influenza from Nasopharyngeal swab specimens  and should not be used as a sole basis for treatment. Nasal washings and aspirates are unacceptable for Xpert Xpress SARS-CoV-2/FLU/RSV testing.  Fact Sheet for Patients: EntrepreneurPulse.com.au  Fact Sheet for Healthcare Providers: IncredibleEmployment.be  This test is not yet approved or cleared by the [place] FDA and has been authorized for detection and/or diagnosis of SARS-CoV-2 by FDA under an Emergency Use Authorization (EUA). This EUA will remain in effect (meaning this test can be used) for the duration of the COVID-19 declaration under Section 564(b)(1) of the Act, 21 U.S.C. section 360bbb-3(b)(1), unless the authorization is terminated or revoked.  Performed at [facility], [place] [address], [place], [place] [address]   MRSA PCR Screening     Status: None   Collection Time: 04/30/20  7:22 AM  Specimen: Nasopharyngeal  Result Value Ref Range Status   MRSA by PCR NEGATIVE NEGATIVE Final    Comment:        The GeneXpert MRSA Assay (FDA approved for NASAL specimens only), is one component of a comprehensive MRSA colonization surveillance program. It is not intended to diagnose MRSA infection nor to guide or monitor treatment for MRSA infections. Performed at [facility], [place] [address], [place], [place] [address]          Radiology Studies: No results found.      Scheduled Meds: .  stroke: mapping our early stages of recovery book   Does not apply Once  . flecainide  50 mg Oral Q12H  . metoprolol succinate  100 mg Oral Daily   Continuous Infusions: . sodium chloride 125 mL/hr at 04/30/20 1011     LOS: 2 days        Hosie Poisson, MD [facility]   To contact the attending provider between 7A-7P or the covering provider during after hours 7P-7A, please log into the web site [email or web address] and access using universal [place] password for that web site. If you do not have the  password, please call the hospital operator.  04/30/2020, 11:14 AM

## 2020-04-30 NOTE — Evaluation (Signed)
SLP Cancellation Note  Patient Details Name: TELISSA PALMISANO MRN: [id number] DOB: 11/25/1933   Cancelled treatment:       Reason Eval/Treat Not Completed: Other (comment) (pt prepping for surgery and had just had Xanax - will defer eval until next date) Family reports pt is recalling events of the day and not demonstrating speech/language deficits.  Daughter states she sees her every other weekend.    Macario Golds 04/30/2020, 11:10 AM

## 2020-05-01 ENCOUNTER — Inpatient Hospital Stay (HOSPITAL_COMMUNITY): Payer: Medicare HMO

## 2020-05-01 ENCOUNTER — Encounter (HOSPITAL_COMMUNITY): Payer: Self-pay | Admitting: Internal Medicine

## 2020-05-01 LAB — CBC WITH DIFFERENTIAL/PLATELET
Abs Immature Granulocytes: 0.02 10*3/uL (ref 0.00–0.07)
Basophils Absolute: 0 10*3/uL (ref 0.0–0.1)
Basophils Relative: 0 %
Eosinophils Absolute: 0.2 10*3/uL (ref 0.0–0.5)
Eosinophils Relative: 2 %
HCT: 39.6 % (ref 36.0–46.0)
Hemoglobin: 12.9 g/dL (ref 12.0–15.0)
Immature Granulocytes: 0 %
Lymphocytes Relative: 16 %
Lymphs Abs: 1.5 10*3/uL (ref 0.7–4.0)
MCH: 30.2 pg (ref 26.0–34.0)
MCHC: 32.6 g/dL (ref 30.0–36.0)
MCV: 92.7 fL (ref 80.0–100.0)
Monocytes Absolute: 0.5 10*3/uL (ref 0.1–1.0)
Monocytes Relative: 6 %
Neutro Abs: 6.8 10*3/uL (ref 1.7–7.7)
Neutrophils Relative %: 76 %
Platelets: 197 10*3/uL (ref 150–400)
RBC: 4.27 MIL/uL (ref 3.87–5.11)
RDW: 12.7 % (ref 11.5–15.5)
WBC: 8.9 10*3/uL (ref 4.0–10.5)
nRBC: 0 % (ref 0.0–0.2)

## 2020-05-01 LAB — COMPREHENSIVE METABOLIC PANEL
ALT: 14 U/L (ref 0–44)
AST: 17 U/L (ref 15–41)
Albumin: 3.2 g/dL — ABNORMAL LOW (ref 3.5–5.0)
Alkaline Phosphatase: 55 U/L (ref 38–126)
Anion gap: 5 (ref 5–15)
BUN: 11 mg/dL (ref 8–23)
CO2: 23 mmol/L (ref 22–32)
Calcium: 8.8 mg/dL — ABNORMAL LOW (ref 8.9–10.3)
Chloride: 108 mmol/L (ref 98–111)
Creatinine, Ser: 0.8 mg/dL (ref 0.44–1.00)
GFR, Estimated: >60 mL/min (ref >60)
Glucose, Bld: 98 mg/dL (ref 70–99)
Potassium: 3.7 mmol/L (ref 3.5–5.1)
Sodium: 136 mmol/L (ref 135–145)
Total Bilirubin: 0.9 mg/dL (ref 0.3–1.2)
Total Protein: 5.7 g/dL — ABNORMAL LOW (ref 6.5–8.1)

## 2020-05-01 LAB — GLUCOSE, CAPILLARY
Glucose-Capillary: 124 mg/dL — ABNORMAL HIGH (ref 70–99)
Glucose-Capillary: 124 mg/dL — ABNORMAL HIGH (ref 70–99)
Glucose-Capillary: 125 mg/dL — ABNORMAL HIGH (ref 70–99)
Glucose-Capillary: 129 mg/dL — ABNORMAL HIGH (ref 70–99)

## 2020-05-01 IMAGING — DX DG CHEST 2V
2 series · 2 of 2 positions shown · non-contrast
Comparison: CT chest [DATE].  Chest x-ray [DATE].

CLINICAL DATA: Cardiac vice in situ.

EXAM:
CHEST - 2 VIEW

[chest lat]
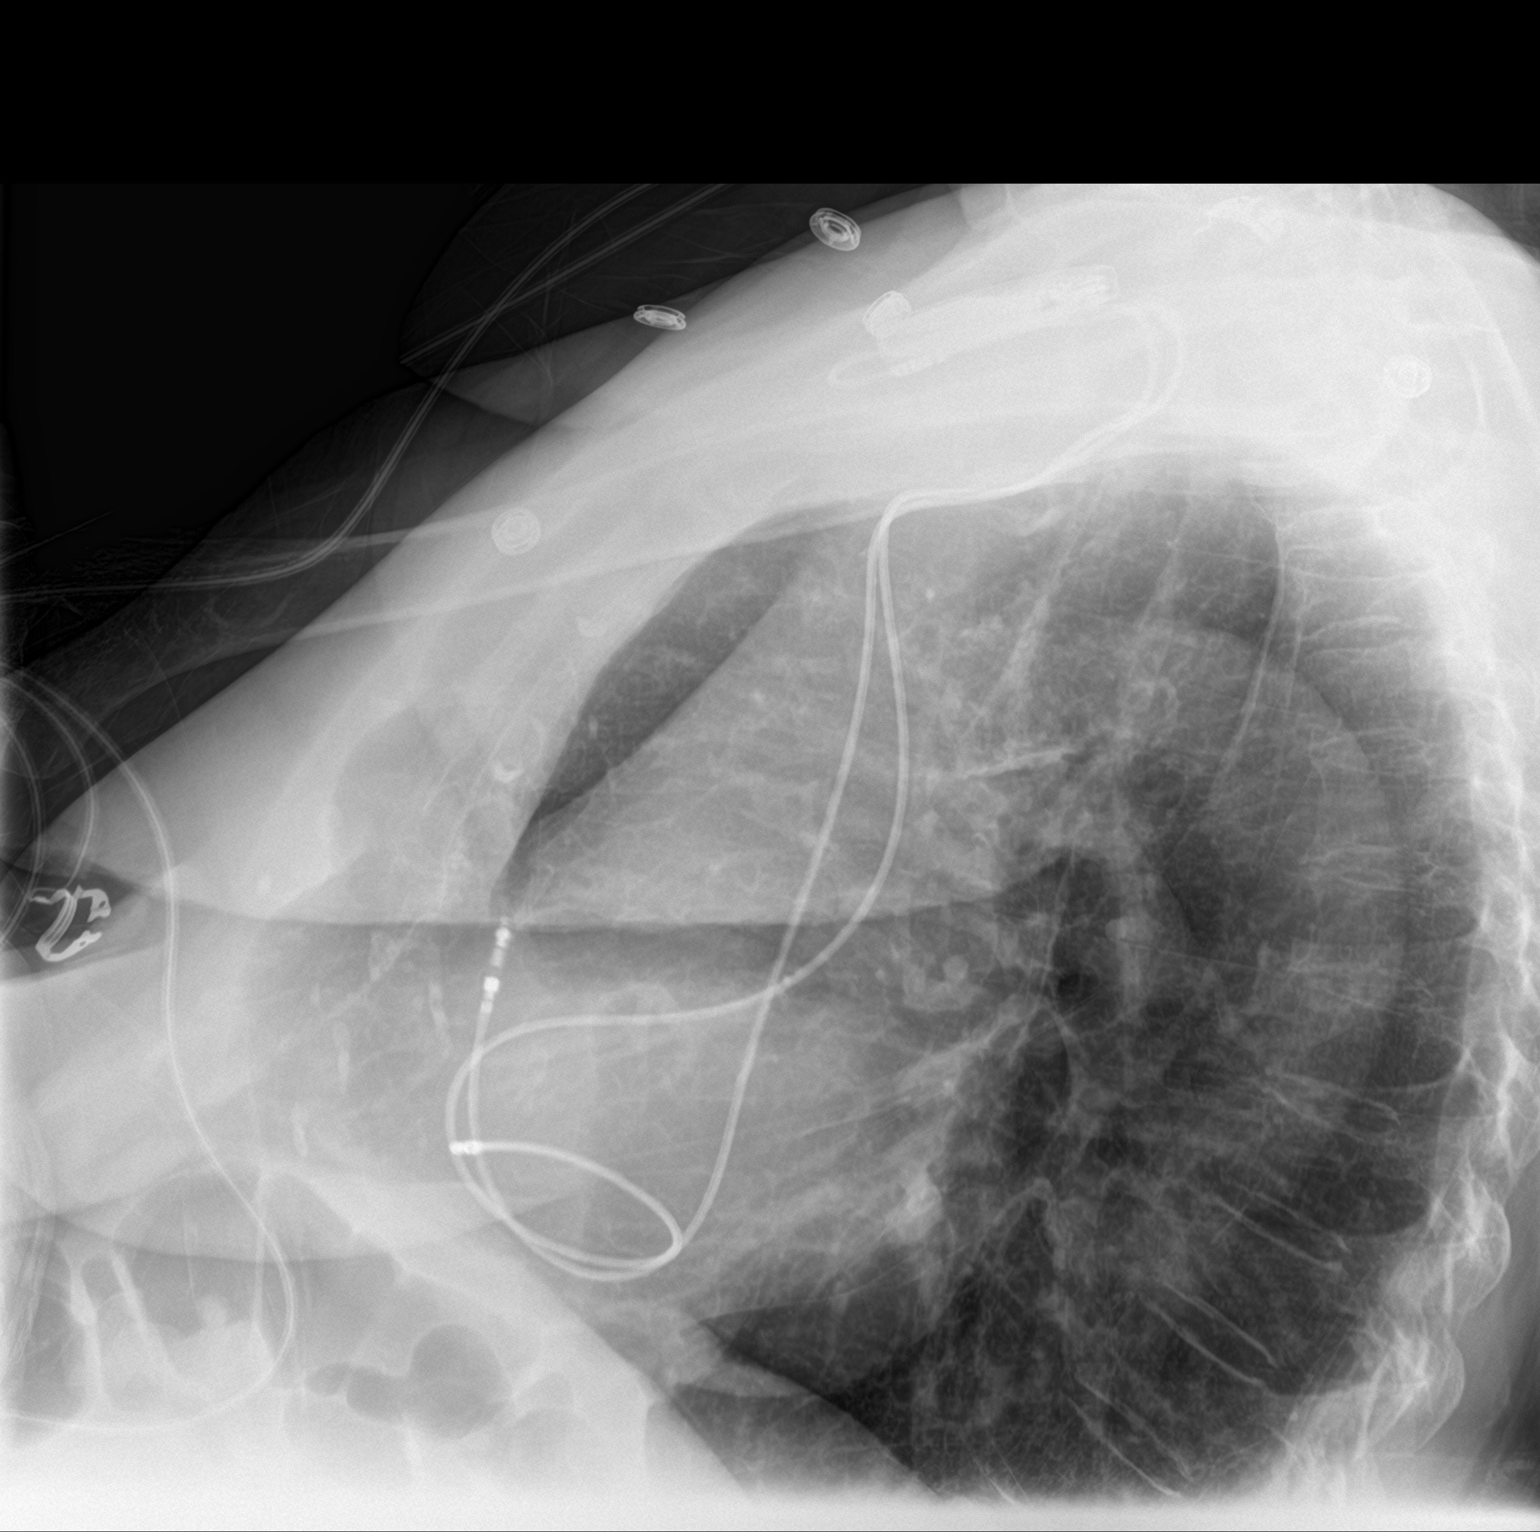

[chest ap]
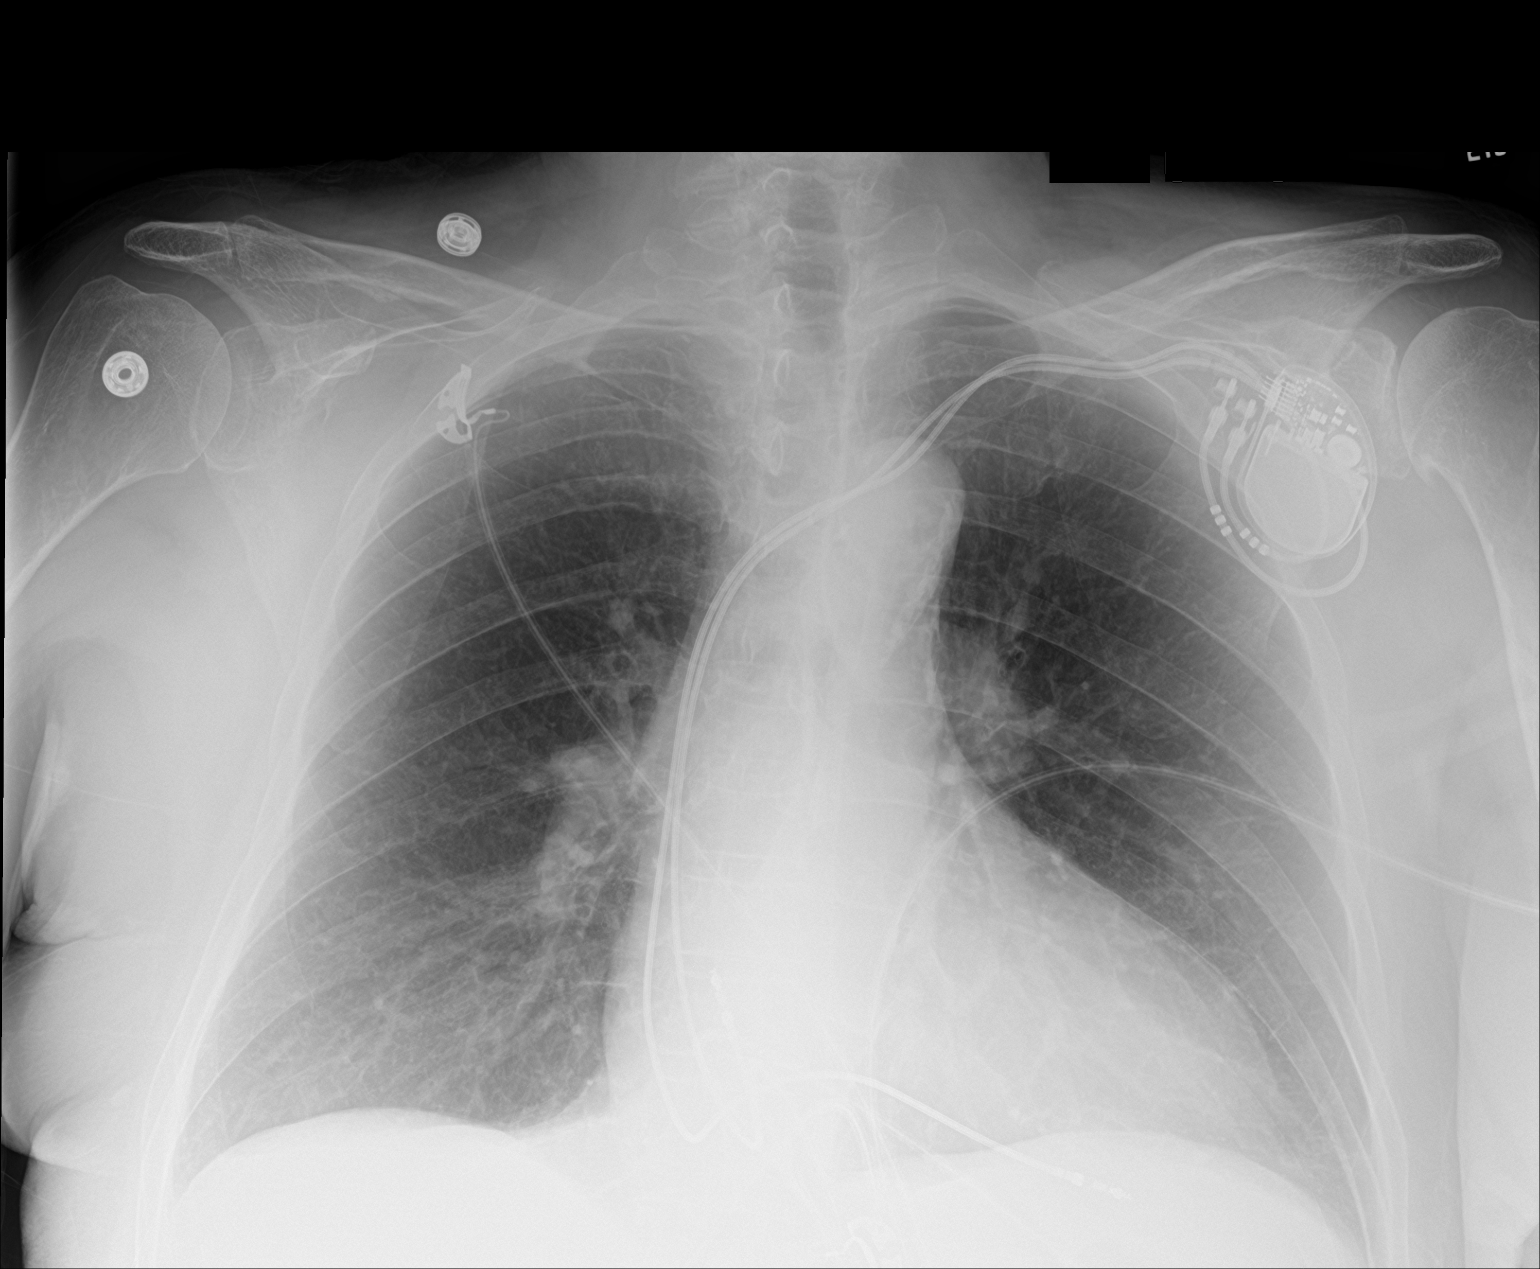

[2 of 2 positions shown; findings below may reference images not displayed]

FINDINGS: Lower portion of the chest not imaged on lateral view. Cardiac pacer
noted with lead tips over the right atrium and right ventricle.
Cardiomegaly. No pulmonary venous congestion. No focal infiltrates.
No pleural effusion or pneumothorax. No acute bony abnormality.
IMPRESSION: 1. Cardiac pacer with lead tips over the right atrium and right
ventricle. Cardiomegaly. No pulmonary venous congestion.

2.  No acute pulmonary disease.

## 2020-05-01 MED ORDER — DILTIAZEM HCL ER COATED BEADS 180 MG PO CP24
180.0000 mg | ORAL_CAPSULE | Freq: Every day | ORAL | Status: DC
  Administered 2020-05-01 – 2020-05-02 (×2): 180 mg via ORAL
  Filled 2020-05-01 (×4): qty 1

## 2020-05-01 MED ORDER — LOSARTAN POTASSIUM 50 MG PO TABS: 50.0000 mg | ORAL_TABLET | Freq: Every day | ORAL | 1 refills | Status: DC

## 2020-05-01 MED ORDER — HYDRALAZINE HCL 25 MG PO TABS
25.0000 mg | ORAL_TABLET | Freq: Three times a day (TID) | ORAL | Status: DC
  Filled 2020-05-01: qty 1

## 2020-05-01 MED ORDER — SENNOSIDES-DOCUSATE SODIUM 8.6-50 MG PO TABS
1.0000 | ORAL_TABLET | Freq: Every evening | ORAL | Status: DC | PRN
Start: 2020-05-01 — End: 2021-10-06

## 2020-05-01 MED ORDER — LOSARTAN POTASSIUM 50 MG PO TABS
50.0000 mg | ORAL_TABLET | Freq: Every day | ORAL | Status: DC
  Administered 2020-05-01 – 2020-05-02 (×2): 50 mg via ORAL
  Filled 2020-05-01 (×2): qty 1

## 2020-05-01 MED ORDER — ROSUVASTATIN CALCIUM 20 MG PO TABS: 20.0000 mg | ORAL_TABLET | Freq: Every day | ORAL | 11 refills | Status: DC

## 2020-05-01 MED ORDER — FLECAINIDE ACETATE 50 MG PO TABS: 50.0000 mg | ORAL_TABLET | Freq: Two times a day (BID) | ORAL | 1 refills | Status: DC

## 2020-05-01 MED ORDER — DILTIAZEM HCL ER COATED BEADS 180 MG PO CP24: 180.0000 mg | ORAL_CAPSULE | Freq: Every day | ORAL | 1 refills | Status: DC

## 2020-05-01 MED ORDER — ROSUVASTATIN CALCIUM 20 MG PO TABS
20.0000 mg | ORAL_TABLET | Freq: Every day | ORAL | Status: DC
  Administered 2020-05-02: 20 mg via ORAL
  Filled 2020-05-01: qty 1

## 2020-05-01 MED ORDER — DILTIAZEM HCL ER COATED BEADS 180 MG PO CP24
180.0000 mg | ORAL_CAPSULE | Freq: Every day | ORAL | 1 refills | Status: DC
Start: 2020-05-02 — End: 2020-06-02

## 2020-05-01 MED ORDER — APIXABAN 5 MG PO TABS: 5.0000 mg | ORAL_TABLET | Freq: Two times a day (BID) | ORAL | 1 refills | Status: DC

## 2020-05-01 MED FILL — Labetalol HCl IV Soln Prefilled Syringe 20 MG/4ML (5 MG/ML): INTRAVENOUS | Qty: 4 | Status: AC

## 2020-05-01 NOTE — Progress Notes (Addendum)
Electrophysiology Rounding Note  Patient Name: Amber Kennedy Date of Encounter: 05/01/2020  Primary Cardiologist: Candee Furbish, MD -> Dr. Virgina Jock Electrophysiologist: Dr. Caryl Comes    Subjective   The patient is doing well today.  At this time, the patient denies chest pain, shortness of breath, or any new concerns.  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  Inpatient Medications    Scheduled Meds:   stroke: mapping our early stages of recovery book   Does not apply Once   apixaban  2.5 mg Oral BID   Followed by   apixaban  5 mg Oral BID   diltiazem  180 mg Oral Daily   flecainide  50 mg Oral Q12H   metoprolol succinate  100 mg Oral Daily   Continuous Infusions:  sodium chloride 125 mL/hr at 05/01/20 0035   PRN Meds: [DISCONTINUED] acetaminophen **OR** acetaminophen (TYLENOL) oral liquid 160 mg/5 mL **OR** acetaminophen, acetaminophen, ALPRAZolam, ondansetron (ZOFRAN) IV, senna-docusate   Vital Signs    Vitals:   04/30/20 2237 05/01/20 0006 05/01/20 0414 05/01/20 0756  BP: (!) 196/78 (!) 182/86 (!) 167/72 (!) 182/68  Pulse: 60 60 61 63  Resp:  (!) 22 20 18   Temp: 97.8 F (36.6 C) 98.7 F (37.1 C) 98.7 F (37.1 C) 97.6 F (36.4 C)  TempSrc: Axillary Axillary Oral Oral  SpO2: 97% 98% 99% 98%  Weight:        Intake/Output Summary (Last 24 hours) at 05/01/2020 0839 Last data filed at 05/01/2020 0502 Gross per 24 hour  Intake 585.17 ml  Output 1950 ml  Net -1364.83 ml   Filed Weights   04/28/20 1400  Weight: 84 kg    Physical Exam    GEN- The patient is well appearing, alert and oriented x 3 today.   Head- normocephalic, atraumatic Eyes-  Sclera clear, conjunctiva pink Ears- hearing intact Oropharynx- clear Neck- supple Lungs- Clear to ausculation bilaterally, normal work of breathing Heart- Regular rate and rhythm, no murmurs, rubs or gallops GI- soft, NT, ND, + BS Extremities- no clubbing or cyanosis. No edema Skin- no rash or lesion Psych- euthymic mood, full  affect Neuro- strength and sensation are intact  Labs    CBC Recent Labs    04/30/20 0504 05/01/20 0408  WBC 7.0 8.9  NEUTROABS 3.6 6.8  HGB 12.5 12.9  HCT 38.4 39.6  MCV 93.7 92.7  PLT 207 858   Basic Metabolic Panel Recent Labs    04/28/20 1811 04/29/20 0716 04/30/20 0504 05/01/20 0408  NA  --  140 139 136  K  --  4.1 4.7 3.7  CL  --  108 110 108  CO2  --  25 25 23   GLUCOSE  --  107* 88 98  BUN  --  16 19 11   CREATININE  --  1.01* 1.09* 0.80  CALCIUM  --  9.3 8.6* 8.8*  MG 2.0 2.0  --   --    Liver Function Tests Recent Labs    04/30/20 0504 05/01/20 0408  AST 31 17  ALT 14 14  ALKPHOS 48 55  BILITOT 1.2 0.9  PROT 5.6* 5.7*  ALBUMIN 3.0* 3.2*   No results for input(s): LIPASE, AMYLASE in the last 72 hours. Cardiac Enzymes No results for input(s): CKTOTAL, CKMB, CKMBINDEX, TROPONINI in the last 72 hours.   Telemetry    A pacing 60s (personally reviewed)  Radiology    DG Chest 2 View  Result Date: 05/01/2020 CLINICAL DATA:  Cardiac vice in  situ. EXAM: CHEST - 2 VIEW COMPARISON:  CT chest 02/05/2017.  Chest x-ray 02/05/2017. FINDINGS: Lower portion of the chest not imaged on lateral view. Cardiac pacer noted with lead tips over the right atrium and right ventricle. Cardiomegaly. No pulmonary venous congestion. No focal infiltrates. No pleural effusion or pneumothorax. No acute bony abnormality. IMPRESSION: 1. Cardiac pacer with lead tips over the right atrium and right ventricle. Cardiomegaly. No pulmonary venous congestion. 2.  No acute pulmonary disease. Electronically Signed   By: Marcello Moores  Register   On: 05/01/2020 08:19    Patient Profile     Amber Kennedy is a 87 y.o. 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 Virgina Jock.  Assessment & Plan    1. 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 Wound care reviewed and "pocket pal" re-applied. She will wear this until Saturday, 4/9.  2. TIA With recent TIA, we opted to minimize interruption of Eliquis.  She received half dose Eliquis last night and this am, will return to full dose Eliquis this evening. Pocket pal utilized for pressure dressing as above.   3. HTN Elevated this am Per primary   She is stable for discharge from an EP perspective. Follow up and wound care/arm restrictions reviewed personally with patient and placed in the chart.   For questions or updates, please contact [place] Please consult [email or web address] for contact info under Cardiology/STEMI.  Signed, Shirley Friar, PA-C  05/01/2020, 8:39 AM   Seen and examined Device function normal and pacemaker pocket without swelling

## 2020-05-01 NOTE — Plan of Care (Signed)
  Problem: Education: Goal: Knowledge of General Education information will improve Description: Including pain rating scale, medication(s)/side effects and non-pharmacologic comfort measures Outcome: Progressing   Problem: Health Behavior/Discharge Planning: Goal: Ability to manage health-related needs will improve Outcome: Progressing   Problem: Clinical Measurements: Goal: Ability to maintain clinical measurements within normal limits will improve Outcome: Progressing Goal: Will remain free from infection Outcome: Progressing Goal: Diagnostic test results will improve Outcome: Progressing Goal: Respiratory complications will improve Outcome: Progressing Goal: Cardiovascular complication will be avoided Outcome: Progressing   Problem: Activity: Goal: Risk for activity intolerance will decrease Outcome: Progressing   Problem: Nutrition: Goal: Adequate nutrition will be maintained Outcome: Progressing   Problem: Coping: Goal: Level of anxiety will decrease Outcome: Progressing   Problem: Elimination: Goal: Will not experience complications related to bowel motility Outcome: Progressing Goal: Will not experience complications related to urinary retention Outcome: Progressing   Problem: Pain Managment: Goal: General experience of comfort will improve Outcome: Progressing   Problem: Safety: Goal: Ability to remain free from injury will improve Outcome: Progressing   Problem: Skin Integrity: Goal: Risk for impaired skin integrity will decrease Outcome: Progressing   Problem: Education: Goal: Knowledge of disease or condition will improve Outcome: Progressing Goal: Knowledge of secondary prevention will improve Outcome: Progressing Goal: Knowledge of patient specific risk factors addressed and post discharge goals established will improve Outcome: Progressing Goal: Individualized Educational Video(s) Outcome: Progressing   Problem: Health Behavior/Discharge  Planning: Goal: Ability to manage health-related needs will improve Outcome: Progressing   Problem: Self-Care: Goal: Ability to participate in self-care as condition permits will improve Outcome: Progressing   Problem: Nutrition: Goal: Risk of aspiration will decrease Outcome: Progressing Goal: Dietary intake will improve Outcome: Progressing

## 2020-05-01 NOTE — Progress Notes (Signed)
PROGRESS NOTE    Amber Kennedy  KXF:[id number] DOB: [birth year] DOA: 04/27/2020 PCP: [name], MD    Chief Complaint  Patient presents with  . TIA symptoms    Brief Narrative:   87 year old lady with prior history of atrial fibrillation, diastolic heart failure, gout presents to ED for slurring speech, facial droop and left hand clumsiness.  CT on admit shows chronic microvascular ischemic changes.  Neurology consulted, suggested that patient had a small vessel TIA.  MRI done and reviewed with the patient.pt underwent St Jude DDD PPM 04/30/20.  But earlier this am, pts BP has been very high, between 220/97 mmbg to 182/68 mmhg. She was started on diltiazem, and losartan.  We will observe her overnight.   Assessment & Plan:   Active Problems:   Essential hypertension   Paroxysmal atrial fibrillation (HCC)   TIA (transient ischemic attack)   Bradycardia   Carotid stenosis, left   TIA:  Asymptomatic. Work up done.  Therapy evaluations recommending outpatient PT.  Started her on eliquis and statin.  LDL is 91.    Atrial fibrillation/tachybradycardia syndrome Patient's CHA2DS2-VASc score more than 4 patient currently on flecainide and Toprol Started on Eliquis.  Rate better controlled.    Chronic diastolic heart failure Patient appears to be compensated at this time.     Hypertensive crisis:  - currently on diltiazem 180 mg daily, cardiology added losartan 50 mg daily, .  bp still    DVT prophylaxis: scd's Code Status: full code.  Family Communication: daughter at bedside.  Disposition:   Status is: Inpatient  Remains inpatient appropriate because:Ongoing diagnostic testing needed not appropriate for outpatient work up   Dispo: The patient is from: Home              Anticipated d/c is to: Home              Patient currently is not medically stable to d/c.   Difficult to place patient No       Consultants:   EP  Cardiology.    Procedures: none.    Antimicrobials: none.   Subjective: No chest pain , reports feeling tired.   Objective: Vitals:   05/01/20 1154 05/01/20 1254 05/01/20 1401 05/01/20 1615  BP: (!) 169/69 (!) 174/75 (!) 151/57 (!) 152/69  Pulse: 63   63  Resp: 17   20  Temp: 98 F (36.7 C)   99.3 F (37.4 C)  TempSrc: Oral   Oral  SpO2: 97%   98%  Weight:        Intake/Output Summary (Last 24 hours) at 05/01/2020 1637 Last data filed at 05/01/2020 1100 Gross per 24 hour  Intake 825.17 ml  Output 2850 ml  Net -2024.83 ml   Filed Weights   04/28/20 1400  Weight: 84 kg    Examination:  General exam: alert and comfortable.  Respiratory system: clear to auscultation, no wheezing or rhonchi.  Cardiovascular system: S1 & S2 heard, RRR no JVD,no pedal edema.  Gastrointestinal system: Abdomen is soft, NT ND BS+ Central nervous system: alert and oriented, non focal.  Extremities: no pedal edema.  Skin: No rashes seen.  Psychiatry: . Mood is appropriate.     Data Reviewed: I have personally reviewed following labs and imaging studies  CBC: Recent Labs  Lab 04/27/20 1408 04/29/20 0716 04/30/20 0504 05/01/20 0408  WBC 9.2 8.8 7.0 8.9  NEUTROABS 6.5 5.0 3.6 6.8  HGB 14.6 14.6 12.5 12.9  HCT 44.8 43.8 38.4 39.6  MCV 93.7 92.2 93.7 92.7  PLT 272 251 207 517    Basic Metabolic Panel: Recent Labs  Lab 04/27/20 1408 04/28/20 0934 04/28/20 1811 04/29/20 0716 04/30/20 0504 05/01/20 0408  NA 138 138  --  140 139 136  K 4.7 3.9  --  4.1 4.7 3.7  CL 106 106  --  108 110 108  CO2 23 25  --  25 25 23   GLUCOSE 120* 100*  --  107* 88 98  BUN 20 22  --  16 19 11   CREATININE 1.23* 1.05*  --  1.01* 1.09* 0.80  CALCIUM 9.6 9.1  --  9.3 8.6* 8.8*  MG  --   --  2.0 2.0  --   --     GFR: Estimated Creatinine Clearance: 57.3 mL/min (by C-G formula based on SCr of 0.8 mg/dL).  Liver Function Tests: Recent Labs  Lab 04/27/20 1408 04/28/20 0934 04/29/20 0716 04/30/20 0504 05/01/20 0408  AST 19 19  21 31 17   ALT 16 15 15 14 14   ALKPHOS 63 51 59 48 55  BILITOT 0.7 0.6 0.7 1.2 0.9  PROT 7.0 6.0* 6.5 5.6* 5.7*  ALBUMIN 3.8 3.2* 3.4* 3.0* 3.2*    CBG: Recent Labs  Lab 05/01/20 1206 05/01/20 1623  GLUCAP 124* 124*     Recent Results (from the past 240 hour(s))  Resp Panel by RT-PCR (Flu A&B, Covid) Nasopharyngeal Swab     Status: None   Collection Time: 04/27/20  2:35 PM   Specimen: Nasopharyngeal Swab; Nasopharyngeal(NP) swabs in vial transport medium  Result Value Ref Range Status   SARS Coronavirus 2 by RT PCR NEGATIVE NEGATIVE Final    Comment: (NOTE) SARS-CoV-2 target nucleic acids are NOT DETECTED.  The SARS-CoV-2 RNA is generally detectable in upper respiratory specimens during the acute phase of infection. The lowest concentration of SARS-CoV-2 viral copies this assay can detect is 138 copies/mL. A negative result does not preclude SARS-Cov-2 infection and should not be used as the sole basis for treatment or other patient management decisions. A negative result may occur with  improper specimen collection/handling, submission of specimen other than nasopharyngeal swab, presence of viral mutation(s) within the areas targeted by this assay, and inadequate number of viral copies(<138 copies/mL). A negative result must be combined with clinical observations, patient history, and epidemiological information. The expected result is Negative.  Fact Sheet for Patients:  EntrepreneurPulse.com.au  Fact Sheet for Healthcare Providers:  IncredibleEmployment.be  This test is no t yet approved or cleared by the [place] FDA and  has been authorized for detection and/or diagnosis of SARS-CoV-2 by FDA under an Emergency Use Authorization (EUA). This EUA will remain  in effect (meaning this test can be used) for the duration of the COVID-19 declaration under Section 564(b)(1) of the Act, 21 U.S.C.section 360bbb-3(b)(1), unless the  authorization is terminated  or revoked sooner.       Influenza A by PCR NEGATIVE NEGATIVE Final   Influenza B by PCR NEGATIVE NEGATIVE Final    Comment: (NOTE) The Xpert Xpress SARS-CoV-2/FLU/RSV plus assay is intended as an aid in the diagnosis of influenza from Nasopharyngeal swab specimens and should not be used as a sole basis for treatment. Nasal washings and aspirates are unacceptable for Xpert Xpress SARS-CoV-2/FLU/RSV testing.  Fact Sheet for Patients: EntrepreneurPulse.com.au  Fact Sheet for Healthcare Providers: IncredibleEmployment.be  This test is not yet approved or cleared by the [place] FDA and has been authorized for detection and/or diagnosis  of SARS-CoV-2 by FDA under an Emergency Use Authorization (EUA). This EUA will remain in effect (meaning this test can be used) for the duration of the COVID-19 declaration under Section 564(b)(1) of the Act, 21 U.S.C. section 360bbb-3(b)(1), unless the authorization is terminated or revoked.  Performed at [facility], [place] [address] [place], [place] [address]   MRSA PCR Screening     Status: None   Collection Time: 04/30/20  7:22 AM   Specimen: Nasopharyngeal  Result Value Ref Range Status   MRSA by PCR NEGATIVE NEGATIVE Final    Comment:        The GeneXpert MRSA Assay (FDA approved for NASAL specimens only), is one component of a comprehensive MRSA colonization surveillance program. It is not intended to diagnose MRSA infection nor to guide or monitor treatment for MRSA infections. Performed at [facility], [place] [address], [place],  [address]          Radiology Studies: DG Chest 2 View  Result Date: 05/01/2020 CLINICAL DATA:  Cardiac vice in situ. EXAM: CHEST - 2 VIEW COMPARISON:  CT chest 02/05/2017.  Chest x-ray 02/05/2017. FINDINGS: Lower portion of the chest not imaged on lateral view. Cardiac pacer noted with lead tips over the right  atrium and right ventricle. Cardiomegaly. No pulmonary venous congestion. No focal infiltrates. No pleural effusion or pneumothorax. No acute bony abnormality. IMPRESSION: 1. Cardiac pacer with lead tips over the right atrium and right ventricle. Cardiomegaly. No pulmonary venous congestion. 2.  No acute pulmonary disease. Electronically Signed   By: Marcello Moores  Register   On: 05/01/2020 08:19        Scheduled Meds: .  stroke: mapping our early stages of recovery book   Does not apply Once  . apixaban  5 mg Oral BID  . diltiazem  180 mg Oral Daily  . flecainide  50 mg Oral Q12H  . hydrALAZINE  25 mg Oral Q8H  . losartan  50 mg Oral Daily  . metoprolol succinate  100 mg Oral Daily   Continuous Infusions:    LOS: 3 days        Hosie Poisson, MD [facility]   To contact the attending provider between 7A-7P or the covering provider during after hours 7P-7A, please log into the web site [email or web address] and access using universal [place] password for that web site. If you do not have the password, please call the hospital operator.  05/01/2020, 4:37 PM

## 2020-05-01 NOTE — Care Management Important Message (Signed)
Important Message  Patient Details  Name: Amber Kennedy MRN: [id number] Date of Birth: 20-Mar-1933   Medicare Important Message Given:  Yes     Kailly Richoux [address] 05/01/2020, 2:49 PM

## 2020-05-01 NOTE — Progress Notes (Signed)
Physical Therapy Treatment Patient Details Name: Amber Kennedy MRN: [id number] DOB: [birth year] Today's Date: 05/01/2020    History of Present Illness Pt is an 87 y.o. female admitted 04/27/20 after episode of slurred speech, L hand weakness and facial droop that lasted 20-30 min per family; symptoms improved by the time EMS arrived. CT/MRI negative for acute intracranial infarct; mild to moderate chronic ischemic changes. Workup for TIA, PAF with RVR adn intermittent pauses. 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 PMH includes HTN, afib, vision loss, gout.   PT Comments    Pt progressing with mobility, now s/p pacemaker implant 4/6. Today's session focused on safe mobility while maintaining LUE movement restrictions; clarified these with PA and reeducated pt. Pt opting to wear LUE sling for comfort this session due to pain. Pt continues to move well with supervision for safety due to fall risk. Pt hopeful for d/c home soon; will continue to follow acutely.  HR 60s-70s Post-ambulation BP 157/81   Follow Up Recommendations  Outpatient PT;Supervision for mobility/OOB (pt declines - reports granddaughter is PT and can assist)     Equipment Recommendations  None recommended by PT    Recommendations for Other Services       Precautions / Restrictions Precautions Precautions: Fall;ICD/Pacemaker Precaution Comments: To wear "pocket pal" dressing/pack s/p pacemaker until 4/9    Mobility  Bed Mobility Overal bed mobility: Modified Independent             General bed mobility comments: Increased time, able to perform without use of LUE    Transfers Overall transfer level: Needs assistance Equipment used: None Transfers: Sit to/from Stand Sit to Stand: Supervision            Ambulation/Gait Ambulation/Gait assistance: Supervision Gait Distance (Feet): 100 Feet Assistive device: None Gait Pattern/deviations: Step-through pattern;Decreased stride length Gait velocity: Decreased   General  Gait Details: Pt declined hallway ambulation, agreeable to ambulate multiple laps in room with LUE sling donned for comfort, supervision for safety; pt endorses feeling tired and weak. HR 60s-70s   Stairs             Wheelchair Mobility    Modified Rankin (Stroke Patients Only)       Balance Overall balance assessment: Needs assistance Sitting-balance support: No upper extremity supported;Feet supported Sitting balance-Leahy Scale: Good     Standing balance support: No upper extremity supported;During functional activity Standing balance-Leahy Scale: Fair                              Cognition Arousal/Alertness: Awake/alert Behavior During Therapy: WFL for tasks assessed/performed Overall Cognitive Status: Within Functional Limits for tasks assessed                                 General Comments: WFL for simple tasks; following commands appropriately; daughter reports "set in her ways" at baseline, per family near baseline cognition      Exercises      General Comments General comments (skin integrity, edema, etc.): Attempted to readjust pacemaker "pocket pal" to ensure pressure dressing actually over incision; sling donned for comfort. Clarified activity restrictions with electrophysiology PA and educ pt (to wear pocket pal until 4/9, no longer required to wear sling). Pt's daughter present and supportive      Pertinent Vitals/Pain Pain Assessment: Faces Faces Pain Scale: Hurts little more Pain Location: L shoulder/chest at  pacemaker insertion Pain Descriptors / Indicators: Discomfort;Guarding Pain Intervention(s): Limited activity within patient's tolerance;Monitored during session;Other (comment) (sling donned for comfort)    Home Living                      Prior Function            PT Goals (current goals can now be found in the care plan section) Progress towards PT goals: Progressing toward goals     Frequency    Min 3X/week      PT Plan Current plan remains appropriate    Co-evaluation              AM-PAC PT "6 Clicks" Mobility   Outcome Measure  Help needed turning from your back to your side while in a flat bed without using bedrails?: None Help needed moving from lying on your back to sitting on the side of a flat bed without using bedrails?: None Help needed moving to and from a bed to a chair (including a wheelchair)?: A Little Help needed standing up from a chair using your arms (e.g., wheelchair or bedside chair)?: A Little Help needed to walk in hospital room?: A Little Help needed climbing 3-5 steps with a railing? : A Little 6 Click Score: 20    End of Session   Activity Tolerance: Patient tolerated treatment well;Patient limited by fatigue Patient left: in chair;with call bell/phone within reach;with nursing/sitter in room;with family/visitor present (NT present for washup) Nurse Communication: Mobility status PT Visit Diagnosis: Unsteadiness on feet (R26.81);Other symptoms and signs involving the nervous system (R29.898)     Time: 3159-4585 PT Time Calculation (min) (ACUTE ONLY): 25 min  Charges:  $Gait Training: 8-22 mins $Self Care/Home Management: [place], PT, DPT Acute Rehabilitation Services  Pager [phone] Office [place] 05/01/2020, 3:09 PM

## 2020-05-01 NOTE — TOC Transition Note (Signed)
Transition of Care [facility]) - CM/SW Discharge Note   Patient Details  Name: Amber Kennedy MRN: [id number] Date of Birth: February 04, 1933  [facility] CM/SW Contact:  Pollie Friar, RN Phone Number: 05/01/2020, 4:03 PM   Clinical Narrative:    Pt discharging home with self care. Outpatient PT recommended but pt refusing. She has a granddaughter that can provide needed therapy at home.  CM provided pt with 30 day free card for Eliquis. She is also aware of $45/ month co pay and feels she can afford this.  Pt will have supervision at home and transportation to home.   Final next level of care: Home/Self Care Barriers to Discharge: No Barriers Identified   Patient Goals and CMS Choice Patient states their goals for this hospitalization and ongoing recovery are:: to return to home CMS Medicare.gov Compare Post Acute Care list provided to:: Patient Choice offered to / list presented to : Patient  Discharge Placement                       Discharge Plan and Services     Post Acute Care Choice: Home Health            DME Agency: NA       HH Arranged: NA          Social Determinants of Health (SDOH) Interventions     Readmission Risk Interventions No flowsheet data found.

## 2020-05-01 NOTE — Progress Notes (Signed)
Subjective:  Feels well. No complaints  S/p pacemaker placement 04/30/20 Objective:  Vital Signs in the last 24 hours: Temp:  [97.6 F (36.4 C)-99.3 F (37.4 C)] 99.3 F (37.4 C) (04/07 1615) Pulse Rate:  [60-63] 63 (04/07 1615) Resp:  [17-22] 20 (04/07 1615) BP: (151-196)/(57-86) 152/69 (04/07 1615) SpO2:  [97 %-99 %] 98 % (04/07 1615)  Intake/Output from previous day: 04/06 0701 - 04/07 0700 In: 1585.3 [I.V.:1585.3] Out: 2550 [Urine:2550]  Physical Exam Vitals and nursing note reviewed.  Constitutional:      General: She is not in acute distress.    Appearance: She is well-developed.  HENT:     Head: Normocephalic and atraumatic.  Eyes:     Conjunctiva/sclera: Conjunctivae normal.     Pupils: Pupils are equal, round, and reactive to light.  Neck:     Vascular: No JVD.  Cardiovascular:     Rate and Rhythm: Normal rate. Rhythm irregular.     Pulses: Normal pulses and intact distal pulses.     Heart sounds: No murmur heard.   Pulmonary:     Effort: Pulmonary effort is normal.     Breath sounds: Normal breath sounds. No wheezing or rales.  Abdominal:     General: Bowel sounds are normal.     Palpations: Abdomen is soft.     Tenderness: There is no rebound.  Musculoskeletal:        General: No tenderness. Normal range of motion.     Left lower leg: No edema.  Lymphadenopathy:     Cervical: No cervical adenopathy.  Skin:    General: Skin is warm and dry.  Neurological:     Mental Status: She is alert and oriented to person, place, and time.     Cranial Nerves: No cranial nerve deficit.      Lab Results: BMP Recent Labs    04/29/20 0716 04/30/20 0504 05/01/20 0408  NA 140 139 136  K 4.1 4.7 3.7  CL 108 110 108  CO2 25 25 23   GLUCOSE 107* Amber 98  BUN 16 19 11   CREATININE 1.01* 1.09* 0.80  CALCIUM 9.3 8.6* 8.8*  GFRNONAA 55* 50* >60    CBC Recent Labs  Lab 05/01/20 0408  WBC 8.9  RBC 4.27  HGB 12.9  HCT 39.6  PLT 197  MCV 92.7  MCH 30.2   MCHC 32.6  RDW 12.7  LYMPHSABS 1.5  MONOABS 0.5  EOSABS 0.2  BASOSABS 0.0    HEMOGLOBIN A1C Lab Results  Component Value Date   HGBA1C 5.7 (H) 04/28/2020   MPG 116.89 04/28/2020     TSH Recent Labs    04/27/20 2259 04/28/20 1811  TSH 3.836 3.057    Lipid Panel     Component Value Date/Time   CHOL 152 04/28/2020 0934   TRIG 143 04/28/2020 0934   HDL 32 (L) 04/28/2020 0934   CHOLHDL 4.8 04/28/2020 0934   VLDL 29 04/28/2020 0934   LDLCALC 91 04/28/2020 0934     Hepatic Function Panel Recent Labs    04/29/20 0716 04/30/20 0504 05/01/20 0408  PROT 6.5 5.6* 5.7*  ALBUMIN 3.4* 3.0* 3.2*  AST 21 31 17   ALT 15 14 14   ALKPHOS 59 48 55  BILITOT 0.7 1.2 0.9    CTA nexk 04/28/2020: 1. 60% diameter stenosis proximal left internal carotid artery due to atherosclerotic disease 2. Right carotid artery widely patent without stenosis. Both vertebral arteries widely patent. 3. Atherosclerotic aortic arch.    Cardiac Studies:  EKG 04/29/2020: Atrial flutter with variable AV conduction, ventricular rate 68 bpm  Echocardiogram 04/28/2020: 1. Left ventricular ejection fraction, by estimation, is 60 to 65%. The  left ventricle has normal function. The left ventricle has no regional  wall motion abnormalities. There is severe left ventricular hypertrophy.  Left ventricular diastolic parameters  are indeterminate.  2. Right ventricular systolic function is normal. The right ventricular  size is normal. There is mildly elevated pulmonary artery systolic  pressure.  3. Left atrial size was moderately dilated.  4. The mitral valve is degenerative. Mild mitral valve regurgitation. No  evidence of mitral stenosis. Moderate mitral annular calcification.  5. The aortic valve was not well visualized. Aortic valve regurgitation  is not visualized. No aortic stenosis is present.  6. Aortic dilatation noted. There is mild dilatation of the ascending  aorta, measuring 38  mm.  7. The inferior vena cava is normal in size with greater than 50%  respiratory variability, suggesting right atrial pressure of 3 mmHg.   Assessment & Recommendations:  Amber Kennedy with hypertension, hyperlipidemia, paroxysmal atrial fibrillation with sinus pauses, TIA  TIA: Management as per Neurology.  CHA2DS2VASc score 6. Annual stroke risk 9%. Xarelto failure, switched to eliquis. Will need outpatient vascular surgery evaluation for symptomatic mod Lt IC stenosis  PAF: Afib/flutter with VR 30-140 Currently in sinus rhythm. On flecainide, diltiazem, and metoprolol. Now s/p pacemaker placement 04/30/20  Hypertension: Blood pressure remains uncontrolled. Need to control, especially to avoid post-op bleeding and given recurrent TIA. Added losartan 50 mg.   Will arrange outpatient f/u in 4 weeks. Keep EP f/u in 10 days.   Nigel Mormon, MD Pager: [phone] Office: [phone]

## 2020-05-01 NOTE — Discharge Instructions (Signed)
After Your Pacemaker   . You have a St. Jude Pacemaker  ACTIVITY . Do not lift your arm above shoulder height for 1 week after your procedure. After 7 days, you may progress as below.  . You should remove your sling 24 hours after your procedure, unless otherwise instructed by your provider.     Thursday May 08, 2020  Friday May 09, 2020 Saturday May 10, 2020 Sunday May 11, 2020   . Do not lift, push, pull, or carry anything over 10 pounds with the affected arm until 6 weeks (Thursday Jun 12, 2020 ) after your procedure.   . Do NOT DRIVE until you have been seen for your wound check, or as long as instructed by your healthcare provider.   . Ask your healthcare provider when you can go back to work   INCISION/Dressing . If you are on a blood thinner such as Coumadin, Xarelto, Eliquis, Plavix, or Pradaxa please confirm with your provider when this should be resumed.   . If large square, outer bandage is left in place, this can be removed after 24 hours from your procedure. Do not remove steri-strips or glue as below.   . Monitor your Pacemaker site for redness, swelling, and drainage. Call the device clinic at [phone] if you experience these symptoms or fever/chills.  . If your incision is closed with Dermabond/Surgical glue. You may shower 1 day after your pacemaker implant and wash around the site with soap and water. You will be wearing a "Pocket Pal" Please leave this in place until Saturday, 4/9.    Marland Kitchen Avoid lotions, ointments, or perfumes over your incision until it is well-healed.  . You may use a hot tub or a pool AFTER your wound check appointment if the incision is completely closed.  Marland Kitchen PAcemaker Alerts:  Some alerts are vibratory and others beep. These are NOT emergencies. Please call our office to let us know. If this occurs at night or on weekends, it can wait until the next business day. Send a remote transmission.  . If your device is capable of reading  fluid status (for heart failure), you will be offered monthly monitoring to review this with you.   DEVICE MANAGEMENT . Remote monitoring is used to monitor your pacemaker from home. This monitoring is scheduled every 91 days by our office. It allows [place] to keep an eye on the functioning of your device to ensure it is working properly. You will routinely see your Electrophysiologist annually (more often if necessary).   . You should receive your ID card for your new device in 4-8 weeks. Keep this card with you at all times once received. Consider wearing a medical alert bracelet or necklace.  . Your Pacemaker may be MRI compatible. This will be discussed at your next office visit/wound check.  You should avoid contact with strong electric or magnetic fields.    Do not use amateur (ham) radio equipment or electric (arc) welding torches. MP3 player headphones with magnets should not be used. Some devices are safe to use if held at least 12 inches (30 cm) from your Pacemaker. These include power tools, lawn mowers, and speakers. If you are unsure if something is safe to use, ask your health care provider.   When using your cell phone, hold it to the ear that is on the opposite side from the Pacemaker. Do not leave your cell phone in a pocket over the Pacemaker.   You may safely use electric  blankets, heating pads, computers, and microwave ovens.  Call the office right away if:  You have chest pain.  You feel more short of breath than you have felt before.  You feel more light-headed than you have felt before.  Your incision starts to open up.  This information is not intended to replace advice given to you by your health care provider. Make sure you discuss any questions you have with your health care provider.

## 2020-05-01 NOTE — Discharge Summary (Signed)
Physician Discharge Summary  Amber Kennedy TKZ:[id number] DOB: [birth year] DOA: 04/27/2020  PCP: [name], MD  Admit date: 04/27/2020 Discharge date: 05/01/2020  Admitted From: Home Disposition:  home  Recommendations for Outpatient Follow-up:  1. Follow up with PCP in 1-2 weeks 2. Please obtain BMP/CBC in one week Please follow up with CARDIOLOGY as recommended Please follow up with EP asrecommended  Discharge Condition:stable CODE STATUS:Full code Diet recommendation: Heart Healthy   Brief/Interim Summary:  87 year old lady with prior history of atrial fibrillation, diastolic heart failure, gout presents to ED for slurring speech, facial droop and left hand clumsiness.  CT on admit shows chronic microvascular ischemic changes.  Neurology consulted, suggested that patient had a small vessel TIA.  MRI done and reviewed with the patient.   Discharge Diagnoses:  Active Problems:   Essential hypertension   Paroxysmal atrial fibrillation (HCC)   TIA (transient ischemic attack)   Bradycardia   Carotid stenosis, left    TIA:  Asymptomatic. Work up done. LDL 91, hemoglobin A1c is 5.7 Therapy evaluations recommending outpatient PT.    Atrial fibrillation/tachybradycardia syndrome Patient's CHA2DS2-VASc score more than 4 patient currently on flecainide and Toprol  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.   Chronic diastolic heart failure Patient appears to be compensated at this time.     Essential hypertension Blood pressure parameters elevated, cozaar added by cardiology.  Continue with metoprolol 100 mg daily.   Discharge Instructions  Discharge Instructions    Ambulatory referral to Neurology   Complete by: As directed    Follow up with stroke clinic NP (Jessica Vanschaick or Cecille Rubin, if both not available, consider Zachery Dauer, or [place]) at [facility] in about 4 weeks. Thanks.   Ambulatory referral to Vascular Surgery   Complete by: As directed    Left ICA  stenosis, asymptomatic   Pocket PAL II device should be worn for: Three days   Complete by: As directed    Number of days: Three days     Allergies as of 05/01/2020      Reactions   Allopurinol Other (See Comments)   "Made me feel badly, so I stopped taking it"   Amlodipine Besylate Swelling, Other (See Comments)   Edema (legs)   Penicillins Hives, Other (See Comments)   "The reaction happened a long time ago. I'm willing to take it now, if necessary."   Albuterol Anxiety, Other (See Comments)   Made the patient jittery   Latex Rash, Other (See Comments)   Allergic to Band-Aids   Penicillin G Hives, Other (See Comments)   "The reaction happened a long time ago. I'm willing to take it now, if necessary."      Medication List    STOP taking these medications   furosemide 20 MG tablet Commonly known as: LASIX   rivaroxaban 20 MG Tabs tablet Commonly known as: Xarelto     TAKE these medications   apixaban 5 MG Tabs tablet Commonly known as: ELIQUIS Take 1 tablet (5 mg total) by mouth 2 (two) times daily.   diltiazem 180 MG 24 hr capsule Commonly known as: CARDIZEM CD Take 1 capsule (180 mg total) by mouth daily. Start taking on: May 02, 2020   flecainide 50 MG tablet Commonly known as: TAMBOCOR Take 1 tablet (50 mg total) by mouth every 12 (twelve) hours.   losartan 50 MG tablet Commonly known as: COZAAR Take 1 tablet (50 mg total) by mouth daily. Start taking on: May 02, 2020   metoprolol  succinate 100 MG 24 hr tablet Commonly known as: TOPROL-XL Take 1 tablet (100 mg total) by mouth daily.   One-A-Day Womens Formula Tabs Take 1 tablet by mouth daily with breakfast.   senna-docusate 8.6-50 MG tablet Commonly known as: Senokot-S Take 1 tablet by mouth at bedtime as needed for mild constipation.   vitamin C 500 MG tablet Commonly known as: ASCORBIC ACID Take 500 mg by mouth daily as needed (for supplementation).       Follow-up Information    [facility]. Schedule an appointment as soon as possible for a visit in 4 week(s).   Specialty: Neurology Contact information: [address] [place] [phone]       [facility] [place]. Schedule an appointment as soon as possible for a visit in 4 week(s).   Specialty: Vascular Surgery Contact information: [place] [address] [place] [facility] Follow up.   Why: on 4/19 at 10 am for post pacemaker wound check  Contact information: [place] [address] [phone]       [name], MD. Schedule an appointment as soon as possible for a visit in 1 week(s).   Specialty: Family Medicine Contact information: [address] [place] [address] [phone]        Jerline Pain, MD .   Specialty: Cardiology Contact information: [phone] [place] [address] [phone]              Allergies  Allergen Reactions  . Allopurinol Other (See Comments)    "Made me feel badly, so I stopped taking it"  . Amlodipine Besylate Swelling and Other (See Comments)    Edema (legs)  . Penicillins Hives and Other (See Comments)    "The reaction happened a long time ago. I'm willing to take it now, if necessary."  . Albuterol Anxiety and Other (See Comments)    Made the patient jittery  . Latex Rash and Other (See Comments)    Allergic to Band-Aids  . Penicillin G Hives and Other (See Comments)    "The reaction happened a long time ago. I'm willing to take it now, if necessary."    Consultations:  Cardiology  EP   Procedures/Studies: DG Chest 2 View  Result Date: 05/01/2020 CLINICAL DATA:  Cardiac vice in situ. EXAM: CHEST - 2 VIEW COMPARISON:  CT chest 02/05/2017.  Chest x-ray 02/05/2017. FINDINGS: Lower portion of the  chest not imaged on lateral view. Cardiac pacer noted with lead tips over the right atrium and right ventricle. Cardiomegaly. No pulmonary venous congestion. No focal infiltrates. No pleural effusion or pneumothorax. No acute bony abnormality. IMPRESSION: 1. Cardiac pacer with lead tips over the right atrium and right ventricle. Cardiomegaly. No pulmonary venous congestion. 2.  No acute pulmonary disease. Electronically Signed   By: Marcello Moores  Register   On: 05/01/2020 08:19   CT HEAD WO CONTRAST  Result Date: 04/27/2020 CLINICAL DATA:  Transient ischemic attack. Slurred speech and left-sided facial droop. EXAM: CT HEAD WITHOUT CONTRAST TECHNIQUE: Contiguous axial images were obtained from the base of the skull through the vertex without intravenous contrast. COMPARISON:  None. FINDINGS: Brain: No evidence of acute large vascular territory infarct. No acute hemorrhage. No mass lesion or abnormal mass effect. No hydrocephalus. Mild-to-moderate patchy white matter hypoattenuation, likely related to chronic microvascular ischemic disease.  Mild to moderate generalized cerebral volume loss. Vascular: Calcific atherosclerosis. No hyperdense vessel identified. Skull: No acute fracture. Sinuses/Orbits: Mild paranasal sinus mucosal thickening. Unremarkable orbits. Other: No mastoid effusions. IMPRESSION: 1. No evidence of acute intracranial abnormality. 2. Mild to moderate chronic microvascular ischemic disease and generalized cerebral volume loss. Electronically Signed   By: Margaretha Sheffield MD   On: 04/27/2020 15:44   CT ANGIO NECK W OR WO CONTRAST  Result Date: 04/28/2020 CLINICAL DATA:  TIA.  Carotid stenosis. EXAM: CT ANGIOGRAPHY NECK TECHNIQUE: Multidetector CT imaging of the neck was performed using the standard protocol during bolus administration of intravenous contrast. Multiplanar CT image reconstructions and MIPs were obtained to evaluate the vascular anatomy. Carotid stenosis measurements (when applicable)  are obtained utilizing NASCET criteria, using the distal internal carotid diameter as the denominator. CONTRAST:  75mL OMNIPAQUE IOHEXOL 350 MG/ML SOLN COMPARISON:  MRA  neck 04/27/2020 FINDINGS: Aortic arch: Moderate atherosclerotic calcification aortic arch without aneurysm or dissection. Atherosclerotic disease without stenosis in the proximal great vessels. Right carotid system: Mild atherosclerotic disease proximal right internal carotid artery without significant stenosis. Tortuosity of the right internal carotid artery Left carotid system: Mild atherosclerotic disease left common carotid artery. Atherosclerotic disease left carotid bifurcation and proximal left internal carotid artery. Minimal luminal diameter of the internal carotid artery 1.8 mm, corresponding to 60% diameter stenosis. Tortuosity of the left internal carotid artery. Vertebral arteries: Both vertebral arteries patent to the basilar without stenosis. Skeleton: Disc degeneration and mild spurring C5-6 and C6-7. No acute skeletal abnormality. Other neck: Negative Upper chest: Lung apices clear bilaterally. IMPRESSION: 1. 60% diameter stenosis proximal left internal carotid artery due to atherosclerotic disease 2. Right carotid artery widely patent without stenosis. Both vertebral arteries widely patent. 3. Atherosclerotic aortic arch. Electronically Signed   By: Franchot Gallo M.D.   On: 04/28/2020 11:24   MR ANGIO HEAD WO CONTRAST  Result Date: 04/27/2020 CLINICAL DATA:  TIA. Episode of slurred speech and facial droop. Left hand weakness. Symptoms lasted 20 minutes. EXAM: MRI HEAD WITHOUT CONTRAST MRA HEAD WITHOUT CONTRAST MRA NECK WITHOUT CONTRAST TECHNIQUE: Multiplanar, multiecho pulse sequences of the brain and surrounding structures were obtained without intravenous contrast. Angiographic images of the Circle of Willis were obtained using MRA technique without intravenous contrast. Angiographic images of the neck were obtained using MRA  technique without intravenous contrast. Carotid stenosis measurements (when applicable) are obtained utilizing NASCET criteria, using the distal internal carotid diameter as the denominator. COMPARISON:  CT head without contrast 04/27/2020 FINDINGS: MRI HEAD FINDINGS Brain: Moderate generalized atrophy and white matter disease is present bilaterally. Asymmetric subcortical white matter changes are present in the right frontal operculum and corona radiata. The ventricles are proportionate to the degree of atrophy. No significant extraaxial fluid collection is present. Basal ganglia are within normal limits. The brainstem and cerebellum are within normal limits. The internal auditory canals are within normal limits. Vascular: Flow is present in the major intracranial arteries. Skull and upper cervical spine: The craniocervical junction is normal. Upper cervical spine is within normal limits. Marrow signal is unremarkable. Sinuses/Orbits: The paranasal sinuses and mastoid air cells are clear. The globes and orbits are within normal limits. MRA HEAD FINDINGS The internal carotid arteries are within normal limits from the high cervical segments through the ICA termini bilaterally. The A1 and M1 segments are normal. 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 MCA bifurcations are within normal limits. Moderate 10 UA shin of distal MCA branch  vessels is present bilaterally without a significant proximal stenosis or occlusion. Vertebral arteries are codominant. Right PICA origin visualized and normal. Left AICA is dominant. Mild narrowing is present in the distal vertebral artery at the vertebrobasilar junction. The basilar artery is normal. Left posterior cerebral artery originates from basilar tip. The right posterior cerebral artery is of fetal type. Attenuation PCA branch vessels is noted bilaterally MRA NECK FINDINGS Time-of-flight images demonstrate a 3 vessel arch configuration. Significant  signal loss is noted at the proximal left internal carotid artery with decreased more distal signal. There is tortuosity of the internal carotid arteries bilaterally. Flow is antegrade in the vertebral arteries bilaterally. IMPRESSION: 1. No acute intracranial abnormality. 2. Moderate generalized atrophy and white matter disease likely reflects the sequela of chronic microvascular ischemia. 3. No significant proximal stenosis, aneurysm, or branch vessel occlusion within the Circle of Willis. 4. 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 5. Moderate distal small vessel disease in the anterior and posterior circulation without other significant proximal stenosis, aneurysm, or branch vessel occlusion. 6. Moderate to high-grade stenosis of the proximal left internal carotid artery suggest on the time-of-flight images. This could be confirmed with carotid Doppler study or CTA. It is contralateral to the symptomatic side. Electronically Signed   By: San Morelle M.D.   On: 04/27/2020 19:49   MR ANGIO NECK WO CONTRAST  Result Date: 04/27/2020 CLINICAL DATA:  TIA. Episode of slurred speech and facial droop. Left hand weakness. Symptoms lasted 20 minutes. EXAM: MRI HEAD WITHOUT CONTRAST MRA HEAD WITHOUT CONTRAST MRA NECK WITHOUT CONTRAST TECHNIQUE: Multiplanar, multiecho pulse sequences of the brain and surrounding structures were obtained without intravenous contrast. Angiographic images of the Circle of Willis were obtained using MRA technique without intravenous contrast. Angiographic images of the neck were obtained using MRA technique without intravenous contrast. Carotid stenosis measurements (when applicable) are obtained utilizing NASCET criteria, using the distal internal carotid diameter as the denominator. COMPARISON:  CT head without contrast 04/27/2020 FINDINGS: MRI HEAD FINDINGS Brain: Moderate generalized atrophy and white matter disease is present bilaterally. Asymmetric subcortical white  matter changes are present in the right frontal operculum and corona radiata. The ventricles are proportionate to the degree of atrophy. No significant extraaxial fluid collection is present. Basal ganglia are within normal limits. The brainstem and cerebellum are within normal limits. The internal auditory canals are within normal limits. Vascular: Flow is present in the major intracranial arteries. Skull and upper cervical spine: The craniocervical junction is normal. Upper cervical spine is within normal limits. Marrow signal is unremarkable. Sinuses/Orbits: The paranasal sinuses and mastoid air cells are clear. The globes and orbits are within normal limits. MRA HEAD FINDINGS The internal carotid arteries are within normal limits from the high cervical segments through the ICA termini bilaterally. The A1 and M1 segments are normal. 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 MCA bifurcations are within normal limits. Moderate 10 UA shin of distal MCA branch vessels is present bilaterally without a significant proximal stenosis or occlusion. Vertebral arteries are codominant. Right PICA origin visualized and normal. Left AICA is dominant. Mild narrowing is present in the distal vertebral artery at the vertebrobasilar junction. The basilar artery is normal. Left posterior cerebral artery originates from basilar tip. The right posterior cerebral artery is of fetal type. Attenuation PCA branch vessels is noted bilaterally MRA NECK FINDINGS Time-of-flight images demonstrate a 3 vessel arch configuration. Significant signal loss is noted at the proximal left internal carotid artery  with decreased more distal signal. There is tortuosity of the internal carotid arteries bilaterally. Flow is antegrade in the vertebral arteries bilaterally. IMPRESSION: 1. No acute intracranial abnormality. 2. Moderate generalized atrophy and white matter disease likely reflects the sequela of chronic microvascular  ischemia. 3. No significant proximal stenosis, aneurysm, or branch vessel occlusion within the Circle of Willis. 4. 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 5. Moderate distal small vessel disease in the anterior and posterior circulation without other significant proximal stenosis, aneurysm, or branch vessel occlusion. 6. Moderate to high-grade stenosis of the proximal left internal carotid artery suggest on the time-of-flight images. This could be confirmed with carotid Doppler study or CTA. It is contralateral to the symptomatic side. Electronically Signed   By: San Morelle M.D.   On: 04/27/2020 19:49   MR BRAIN WO CONTRAST  Result Date: 04/27/2020 CLINICAL DATA:  TIA. Episode of slurred speech and facial droop. Left hand weakness. Symptoms lasted 20 minutes. EXAM: MRI HEAD WITHOUT CONTRAST MRA HEAD WITHOUT CONTRAST MRA NECK WITHOUT CONTRAST TECHNIQUE: Multiplanar, multiecho pulse sequences of the brain and surrounding structures were obtained without intravenous contrast. Angiographic images of the Circle of Willis were obtained using MRA technique without intravenous contrast. Angiographic images of the neck were obtained using MRA technique without intravenous contrast. Carotid stenosis measurements (when applicable) are obtained utilizing NASCET criteria, using the distal internal carotid diameter as the denominator. COMPARISON:  CT head without contrast 04/27/2020 FINDINGS: MRI HEAD FINDINGS Brain: Moderate generalized atrophy and white matter disease is present bilaterally. Asymmetric subcortical white matter changes are present in the right frontal operculum and corona radiata. The ventricles are proportionate to the degree of atrophy. No significant extraaxial fluid collection is present. Basal ganglia are within normal limits. The brainstem and cerebellum are within normal limits. The internal auditory canals are within normal limits. Vascular: Flow is present in the major  intracranial arteries. Skull and upper cervical spine: The craniocervical junction is normal. Upper cervical spine is within normal limits. Marrow signal is unremarkable. Sinuses/Orbits: The paranasal sinuses and mastoid air cells are clear. The globes and orbits are within normal limits. MRA HEAD FINDINGS The internal carotid arteries are within normal limits from the high cervical segments through the ICA termini bilaterally. The A1 and M1 segments are normal. 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 MCA bifurcations are within normal limits. Moderate 10 UA shin of distal MCA branch vessels is present bilaterally without a significant proximal stenosis or occlusion. Vertebral arteries are codominant. Right PICA origin visualized and normal. Left AICA is dominant. Mild narrowing is present in the distal vertebral artery at the vertebrobasilar junction. The basilar artery is normal. Left posterior cerebral artery originates from basilar tip. The right posterior cerebral artery is of fetal type. Attenuation PCA branch vessels is noted bilaterally MRA NECK FINDINGS Time-of-flight images demonstrate a 3 vessel arch configuration. Significant signal loss is noted at the proximal left internal carotid artery with decreased more distal signal. There is tortuosity of the internal carotid arteries bilaterally. Flow is antegrade in the vertebral arteries bilaterally. IMPRESSION: 1. No acute intracranial abnormality. 2. Moderate generalized atrophy and white matter disease likely reflects the sequela of chronic microvascular ischemia. 3. No significant proximal stenosis, aneurysm, or branch vessel occlusion within the Circle of Willis. 4. 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 5. Moderate distal small vessel disease in the anterior and posterior circulation without other significant proximal stenosis, aneurysm, or branch vessel occlusion. 6. Moderate  to high-grade stenosis of the  proximal left internal carotid artery suggest on the time-of-flight images. This could be confirmed with carotid Doppler study or CTA. It is contralateral to the symptomatic side. Electronically Signed   By: San Morelle M.D.   On: 04/27/2020 19:49   ECHOCARDIOGRAM COMPLETE  Result Date: 04/28/2020    ECHOCARDIOGRAM REPORT   Patient Name:   Akshitha AMYBETH SIEG Date of Exam: 04/28/2020 Medical Rec #:  [id number]     Height:       67.0 in Accession #:    [id number]    Weight:       180.0 lb Date of Birth:  12-16-1933     BSA:          1.934 m Patient Age:    30 years      BP:           156/50 mmHg Patient Gender: F             HR:           54 bpm. Exam Location:  Inpatient Procedure: 2D Echo, 3D Echo, Cardiac Doppler and Color Doppler Indications:    TIA  History:        Patient has prior history of Echocardiogram examinations, most                 recent 02/06/2017. CHF, Abnormal ECG and Prior CABG, TIA,                 Arrythmias:Atrial Fibrillation; Risk Factors:Diabetes. Edema.  Sonographer:    Roseanna Rainbow RDCS Referring Phys: [id number] [place]  Sonographer Comments: Suboptimal parasternal window. IMPRESSIONS  1. Left ventricular ejection fraction, by estimation, is 60 to 65%. The left ventricle has normal function. The left ventricle has no regional wall motion abnormalities. There is severe left ventricular hypertrophy. Left ventricular diastolic parameters  are indeterminate.  2. Right ventricular systolic function is normal. The right ventricular size is normal. There is mildly elevated pulmonary artery systolic pressure.  3. Left atrial size was moderately dilated.  4. The mitral valve is degenerative. Mild mitral valve regurgitation. No evidence of mitral stenosis. Moderate mitral annular calcification.  5. The aortic valve was not well visualized. Aortic valve regurgitation is not visualized. No aortic stenosis is present.  6. Aortic dilatation noted. There is mild dilatation of the ascending aorta,  measuring 38 mm.  7. The inferior vena cava is normal in size with greater than 50% respiratory variability, suggesting right atrial pressure of 3 mmHg. FINDINGS  Left Ventricle: Left ventricular ejection fraction, by estimation, is 60 to 65%. The left ventricle has normal function. The left ventricle has no regional wall motion abnormalities. The left ventricular internal cavity size was normal in size. There is  severe left ventricular hypertrophy. Left ventricular diastolic parameters are indeterminate. Right Ventricle: The right ventricular size is normal. No increase in right ventricular wall thickness. Right ventricular systolic function is normal. There is mildly elevated pulmonary artery systolic pressure. The tricuspid regurgitant velocity is 2.84  m/s, and with an assumed right atrial pressure of 8 mmHg, the estimated right ventricular systolic pressure is 03.4 mmHg. Left Atrium: Left atrial size was moderately dilated. Right Atrium: Right atrial size was normal in size. Pericardium: There is no evidence of pericardial effusion. Mitral Valve: The mitral valve is degenerative in appearance. There is mild thickening of the mitral valve leaflet(s). There is mild calcification of the mitral valve leaflet(s). Moderate mitral annular calcification.  Mild mitral valve regurgitation. No evidence of mitral valve stenosis. Tricuspid Valve: The tricuspid valve is normal in structure. Tricuspid valve regurgitation is not demonstrated. No evidence of tricuspid stenosis. Aortic Valve: The aortic valve was not well visualized. Aortic valve regurgitation is not visualized. No aortic stenosis is present. Pulmonic Valve: The pulmonic valve was normal in structure. Pulmonic valve regurgitation is not visualized. No evidence of pulmonic stenosis. Aorta: The aortic root is normal in size and structure and aortic dilatation noted. There is mild dilatation of the ascending aorta, measuring 38 mm. Venous: The inferior vena cava is  normal in size with greater than 50% respiratory variability, suggesting right atrial pressure of 3 mmHg. IAS/Shunts: No atrial level shunt detected by color flow Doppler.  LEFT VENTRICLE PLAX 2D LVIDd:         3.80 cm     Diastology LVIDs:         2.20 cm     LV e' medial:    6.09 cm/s LV PW:         1.40 cm     LV E/e' medial:  14.3 LV IVS:        2.00 cm     LV e' lateral:   7.40 cm/s LVOT diam:     2.20 cm     LV E/e' lateral: 11.8 LV SV:         94 LV SV Index:   49 LVOT Area:     3.80 cm  LV Volumes (MOD) LV vol d, MOD A2C: 61.0 ml LV vol d, MOD A4C: 66.0 ml LV vol s, MOD A2C: 13.2 ml LV vol s, MOD A4C: 17.7 ml LV SV MOD A2C:     47.8 ml LV SV MOD A4C:     66.0 ml LV SV MOD BP:      48.5 ml RIGHT VENTRICLE            IVC RV S prime:     8.05 cm/s  IVC diam: 2.00 cm TAPSE (M-mode): 1.5 cm LEFT ATRIUM           Index       RIGHT ATRIUM           Index LA diam:      4.30 cm 2.22 cm/m  RA Area:     12.20 cm LA Vol (A2C): 47.9 ml 24.77 ml/m RA Volume:   23.00 ml  11.89 ml/m LA Vol (A4C): 35.3 ml 18.26 ml/m  AORTIC VALVE LVOT Vmax:   93.30 cm/s LVOT Vmean:  66.700 cm/s LVOT VTI:    0.248 m  AORTA Ao Root diam: 3.80 cm Ao Asc diam:  3.80 cm MITRAL VALVE               TRICUSPID VALVE MV Area (PHT): 3.50 cm    TR Peak grad:   32.3 mmHg MV Decel Time: 217 msec    TR Vmax:        284.00 cm/s MV E velocity: 86.95 cm/s MV A velocity: 84.85 cm/s  SHUNTS MV E/A ratio:  1.02        Systemic VTI:  0.25 m                            Systemic Diam: 2.20 cm Jenkins Rouge MD Electronically signed by Jenkins Rouge MD Signature Date/Time: 04/28/2020/9:50:00 AM    Final        Subjective: No new complaints.  Discharge Exam: Vitals:   05/01/20 0756 05/01/20 1002  BP: (!) 182/68 (!) 177/66  Pulse: 63 63  Resp: 18   Temp: 97.6 F (36.4 C)   SpO2: 98%    Vitals:   05/01/20 0006 05/01/20 0414 05/01/20 0756 05/01/20 1002  BP: (!) 182/86 (!) 167/72 (!) 182/68 (!) 177/66  Pulse: 60 61 63 63  Resp: (!) 22 20 18     Temp: 98.7 F (37.1 C) 98.7 F (37.1 C) 97.6 F (36.4 C)   TempSrc: Axillary Oral Oral   SpO2: 98% 99% 98%   Weight:        General: Pt is alert, awake, not in acute distress Cardiovascular: RRR, S1/S2 +, no rubs, no gallops Respiratory: CTA bilaterally, no wheezing, no rhonchi Abdominal: Soft, NT, ND, bowel sounds + Extremities: no edema, no cyanosis    The results of significant diagnostics from this hospitalization (including imaging, microbiology, ancillary and laboratory) are listed below for reference.     Microbiology: Recent Results (from the past 240 hour(s))  Resp Panel by RT-PCR (Flu A&B, Covid) Nasopharyngeal Swab     Status: None   Collection Time: 04/27/20  2:35 PM   Specimen: Nasopharyngeal Swab; Nasopharyngeal(NP) swabs in vial transport medium  Result Value Ref Range Status   SARS Coronavirus 2 by RT PCR NEGATIVE NEGATIVE Final    Comment: (NOTE) SARS-CoV-2 target nucleic acids are NOT DETECTED.  The SARS-CoV-2 RNA is generally detectable in upper respiratory specimens during the acute phase of infection. The lowest concentration of SARS-CoV-2 viral copies this assay can detect is 138 copies/mL. A negative result does not preclude SARS-Cov-2 infection and should not be used as the sole basis for treatment or other patient management decisions. A negative result may occur with  improper specimen collection/handling, submission of specimen other than nasopharyngeal swab, presence of viral mutation(s) within the areas targeted by this assay, and inadequate number of viral copies(<138 copies/mL). A negative result must be combined with clinical observations, patient history, and epidemiological information. The expected result is Negative.  Fact Sheet for Patients:  EntrepreneurPulse.com.au  Fact Sheet for Healthcare Providers:  IncredibleEmployment.be  This test is no t yet approved or cleared by the [place] FDA  and  has been authorized for detection and/or diagnosis of SARS-CoV-2 by FDA under an Emergency Use Authorization (EUA). This EUA will remain  in effect (meaning this test can be used) for the duration of the COVID-19 declaration under Section 564(b)(1) of the Act, 21 U.S.C.section 360bbb-3(b)(1), unless the authorization is terminated  or revoked sooner.       Influenza A by PCR NEGATIVE NEGATIVE Final   Influenza B by PCR NEGATIVE NEGATIVE Final    Comment: (NOTE) The Xpert Xpress SARS-CoV-2/FLU/RSV plus assay is intended as an aid in the diagnosis of influenza from Nasopharyngeal swab specimens and should not be used as a sole basis for treatment. Nasal washings and aspirates are unacceptable for Xpert Xpress SARS-CoV-2/FLU/RSV testing.  Fact Sheet for Patients: EntrepreneurPulse.com.au  Fact Sheet for Healthcare Providers: IncredibleEmployment.be  This test is not yet approved or cleared by the [place] FDA and has been authorized for detection and/or diagnosis of SARS-CoV-2 by FDA under an Emergency Use Authorization (EUA). This EUA will remain in effect (meaning this test can be used) for the duration of the COVID-19 declaration under Section 564(b)(1) of the Act, 21 U.S.C. section 360bbb-3(b)(1), unless the authorization is terminated or revoked.  Performed at [facility], [place],  [place] [address]   MRSA PCR Screening     Status: None   Collection Time: 04/30/20  7:22 AM   Specimen: Nasopharyngeal  Result Value Ref Range Status   MRSA by PCR NEGATIVE NEGATIVE Final    Comment:        The GeneXpert MRSA Assay (FDA approved for NASAL specimens only), is one component of a comprehensive MRSA colonization surveillance program. It is not intended to diagnose MRSA infection nor to guide or monitor treatment for MRSA infections. Performed at [facility], [place] [address], [place],   [address]      Labs: BNP (last 3 results) No results for input(s): BNP in the last 8760 hours. Basic Metabolic Panel: Recent Labs  Lab 04/27/20 1408 04/28/20 0934 04/28/20 1811 04/29/20 0716 04/30/20 0504 05/01/20 0408  NA 138 138  --  140 139 136  K 4.7 3.9  --  4.1 4.7 3.7  CL 106 106  --  108 110 108  CO2 23 25  --  25 25 23   GLUCOSE 120* 100*  --  107* 88 98  BUN 20 22  --  16 19 11   CREATININE 1.23* 1.05*  --  1.01* 1.09* 0.80  CALCIUM 9.6 9.1  --  9.3 8.6* 8.8*  MG  --   --  2.0 2.0  --   --    Liver Function Tests: Recent Labs  Lab 04/27/20 1408 04/28/20 0934 04/29/20 0716 04/30/20 0504 05/01/20 0408  AST 19 19 21 31 17   ALT 16 15 15 14 14   ALKPHOS 63 51 59 48 55  BILITOT 0.7 0.6 0.7 1.2 0.9  PROT 7.0 6.0* 6.5 5.6* 5.7*  ALBUMIN 3.8 3.2* 3.4* 3.0* 3.2*   No results for input(s): LIPASE, AMYLASE in the last 168 hours. No results for input(s): AMMONIA in the last 168 hours. CBC: Recent Labs  Lab 04/27/20 1408 04/29/20 0716 04/30/20 0504 05/01/20 0408  WBC 9.2 8.8 7.0 8.9  NEUTROABS 6.5 5.0 3.6 6.8  HGB 14.6 14.6 12.5 12.9  HCT 44.8 43.8 38.4 39.6  MCV 93.7 92.2 93.7 92.7  PLT 272 251 207 197   Cardiac Enzymes: No results for input(s): CKTOTAL, CKMB, CKMBINDEX, TROPONINI in the last 168 hours. BNP: Invalid input(s): POCBNP CBG: No results for input(s): GLUCAP in the last 168 hours. D-Dimer No results for input(s): DDIMER in the last 72 hours. Hgb A1c No results for input(s): HGBA1C in the last 72 hours. Lipid Profile No results for input(s): CHOL, HDL, LDLCALC, TRIG, CHOLHDL, LDLDIRECT in the last 72 hours. Thyroid function studies Recent Labs    04/28/20 1811  TSH 3.057   Anemia work up No results for input(s): VITAMINB12, FOLATE, FERRITIN, TIBC, IRON, RETICCTPCT in the last 72 hours. Urinalysis    Component Value Date/Time   COLORURINE YELLOW 04/28/2020 0625   APPEARANCEUR HAZY (A) 04/28/2020 0625   LABSPEC 1.011 04/28/2020 0625    PHURINE 5.0 04/28/2020 0625   GLUCOSEU NEGATIVE 04/28/2020 0625   HGBUR NEGATIVE 04/28/2020 0625   BILIRUBINUR NEGATIVE 04/28/2020 0625   KETONESUR NEGATIVE 04/28/2020 0625   PROTEINUR NEGATIVE 04/28/2020 0625   NITRITE NEGATIVE 04/28/2020 0625   LEUKOCYTESUR LARGE (A) 04/28/2020 0625   Sepsis Labs Invalid input(s): PROCALCITONIN,  WBC,  LACTICIDVEN Microbiology Recent Results (from the past 240 hour(s))  Resp Panel by RT-PCR (Flu A&B, Covid) Nasopharyngeal Swab     Status: None   Collection Time: 04/27/20  2:35 PM   Specimen: Nasopharyngeal Swab; Nasopharyngeal(NP) swabs  in vial transport medium  Result Value Ref Range Status   SARS Coronavirus 2 by RT PCR NEGATIVE NEGATIVE Final    Comment: (NOTE) SARS-CoV-2 target nucleic acids are NOT DETECTED.  The SARS-CoV-2 RNA is generally detectable in upper respiratory specimens during the acute phase of infection. The lowest concentration of SARS-CoV-2 viral copies this assay can detect is 138 copies/mL. A negative result does not preclude SARS-Cov-2 infection and should not be used as the sole basis for treatment or other patient management decisions. A negative result may occur with  improper specimen collection/handling, submission of specimen other than nasopharyngeal swab, presence of viral mutation(s) within the areas targeted by this assay, and inadequate number of viral copies(<138 copies/mL). A negative result must be combined with clinical observations, patient history, and epidemiological information. The expected result is Negative.  Fact Sheet for Patients:  EntrepreneurPulse.com.au  Fact Sheet for Healthcare Providers:  IncredibleEmployment.be  This test is no t yet approved or cleared by the [place] FDA and  has been authorized for detection and/or diagnosis of SARS-CoV-2 by FDA under an Emergency Use Authorization (EUA). This EUA will remain  in effect (meaning this test  can be used) for the duration of the COVID-19 declaration under Section 564(b)(1) of the Act, 21 U.S.C.section 360bbb-3(b)(1), unless the authorization is terminated  or revoked sooner.       Influenza A by PCR NEGATIVE NEGATIVE Final   Influenza B by PCR NEGATIVE NEGATIVE Final    Comment: (NOTE) The Xpert Xpress SARS-CoV-2/FLU/RSV plus assay is intended as an aid in the diagnosis of influenza from Nasopharyngeal swab specimens and should not be used as a sole basis for treatment. Nasal washings and aspirates are unacceptable for Xpert Xpress SARS-CoV-2/FLU/RSV testing.  Fact Sheet for Patients: EntrepreneurPulse.com.au  Fact Sheet for Healthcare Providers: IncredibleEmployment.be  This test is not yet approved or cleared by the [place] FDA and has been authorized for detection and/or diagnosis of SARS-CoV-2 by FDA under an Emergency Use Authorization (EUA). This EUA will remain in effect (meaning this test can be used) for the duration of the COVID-19 declaration under Section 564(b)(1) of the Act, 21 U.S.C. section 360bbb-3(b)(1), unless the authorization is terminated or revoked.  Performed at [facility], [place] [address], [place], [place] [address]   MRSA PCR Screening     Status: None   Collection Time: 04/30/20  7:22 AM   Specimen: Nasopharyngeal  Result Value Ref Range Status   MRSA by PCR NEGATIVE NEGATIVE Final    Comment:        The GeneXpert MRSA Assay (FDA approved for NASAL specimens only), is one component of a comprehensive MRSA colonization surveillance program. It is not intended to diagnose MRSA infection nor to guide or monitor treatment for MRSA infections. Performed at [facility], [place] [address], [place], [place] [address]      Time coordinating discharge: 36 minutes. SIGNED:   Hosie Poisson, MD  [facility] 05/01/2020, 10:39 AM

## 2020-05-01 NOTE — TOC Benefit Eligibility Note (Signed)
Transition of Care [facility]) Benefit Eligibility Note    Patient Details  Name: Amber Kennedy MRN: [id number] Date of Birth: 1933/02/10   Medication/Dose: Arne Cleveland  5 MG BID  Covered?: Yes  Tier: 3 Drug  Prescription Coverage Preferred Pharmacy: [facility] with Person/Company/Phone Number:: [facility]  @ [employer] SW # [phone]  Co-Pay: $45.00  Prior Approval: No  Deductible:  (NO DEDUCTIBLE WITH PLAN)       Amber Kennedy Phone Number: 05/01/2020, 2:14 PM

## 2020-05-01 NOTE — Progress Notes (Signed)
Blood pressure remains uncontrolled. Need to control, especially to avoid post-op bleeding and given recurrent TIA. Added losartan 50 mg. Will arrange outpatient f/u in 4 weeks. Keep EP f/u in 10 days.   Nigel Mormon, MD Pager: [phone] Office: [phone]

## 2020-05-01 NOTE — Progress Notes (Signed)
SLP Cancellation Note  Patient Details Name: Amber Kennedy MRN: [id number] DOB: 04-Jun-1933   Cancelled treatment:       Reason Eval/Treat Not Completed: SLP screened, no needs identified, will sign off   [name] 05/01/2020, 1:26 PM

## 2020-05-02 LAB — GLUCOSE, CAPILLARY: Glucose-Capillary: 125 mg/dL — ABNORMAL HIGH (ref 70–99)

## 2020-05-02 MED ORDER — POLYETHYLENE GLYCOL 3350 17 G PO PACK
17.0000 g | PACK | Freq: Every day | ORAL | 0 refills | Status: AC | PRN
Start: 2020-05-02 — End: ?

## 2020-05-02 MED ORDER — POLYETHYLENE GLYCOL 3350 17 G PO PACK
17.0000 g | PACK | Freq: Every day | ORAL | Status: DC
  Administered 2020-05-02: 17 g via ORAL
  Filled 2020-05-02: qty 1

## 2020-05-02 NOTE — Progress Notes (Signed)
Physical Therapy Treatment Patient Details Name: Amber Kennedy MRN: [id number] DOB: [birth year] Today's Date: 05/02/2020    History of Present Illness Pt is an 87 y.o. female admitted 04/27/20 after episode of slurred speech, L hand weakness and facial droop that lasted 20-30 min per family; symptoms improved by the time EMS arrived. CT/MRI negative for acute intracranial infarct; mild to moderate chronic ischemic changes. Workup for TIA, PAF with RVR adn intermittent pauses. 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 PMH includes HTN, afib, vision loss, gout.    PT Comments    Pt eager to d/c home today, agreeable to practicing steps and progressing gait distance to ensure both pt and caregiver comfort with d/c. Pt ambulatory in hallway without AD >300 ft, occasionally requiring close guard for mild unsteadiness. Pt endorses feeling weaker than normal, but states her granddaughter who is a PT will assist her with mobility progression once home. Pt proficiently navigated steps, PT cuing both pt and caregiver for safety during all mobility. Pt plans to d/c presently.     Follow Up Recommendations  Outpatient PT;Supervision for mobility/OOB (pt declines)     Equipment Recommendations  None recommended by PT    Recommendations for Other Services       Precautions / Restrictions Precautions Precautions: Fall;ICD/Pacemaker Precaution Comments: To wear "pocket pal" dressing/pack s/p pacemaker until 4/9 Restrictions Weight Bearing Restrictions: No    Mobility  Bed Mobility Overal bed mobility: Modified Independent             General bed mobility comments: Increased time, cues to not use LUE to push/pull    Transfers Overall transfer level: Needs assistance Equipment used: None Transfers: Sit to/from Stand Sit to Stand: Min guard         General transfer comment: min guard for safety, slow to rise and steady  Ambulation/Gait Ambulation/Gait assistance: Supervision;Min guard Gait Distance  (Feet): 315 Feet Assistive device: None Gait Pattern/deviations: Step-through pattern;Decreased stride length Gait velocity: decr   General Gait Details: min guard initially as pt with mild unsteadiness, transitioning to min guard for safety. Pt's daughter present and performs appropriate guard   Stairs   Stairs assistance: Min guard Stair Management: One rail Right;Alternating pattern;Step to pattern;Forwards Number of Stairs: 10 General stair comments: min guard for safety, PT had pt's daughter guard pt on steps to prepare for home. PT cuing pt for step-to pattern with descending steps, caregiver standing below pt on both ascending/descending   Wheelchair Mobility    Modified Rankin (Stroke Patients Only)       Balance Overall balance assessment: Needs assistance Sitting-balance support: No upper extremity supported;Feet supported Sitting balance-Leahy Scale: Good     Standing balance support: No upper extremity supported;During functional activity Standing balance-Leahy Scale: Fair                              Cognition Arousal/Alertness: Awake/alert Behavior During Therapy: WFL for tasks assessed/performed Overall Cognitive Status: Within Functional Limits for tasks assessed                                 General Comments: Pt endorses being very independent and likes doing things her way      Exercises      General Comments        Pertinent Vitals/Pain Pain Assessment: Faces Faces Pain Scale: Hurts a little bit Pain Location:  L shoulder/chest, s/p pacemaker Pain Descriptors / Indicators: Discomfort Pain Intervention(s): Limited activity within patient's tolerance;Monitored during session;Repositioned    Home Living                      Prior Function            PT Goals (current goals can now be found in the care plan section) Acute Rehab PT Goals Patient Stated Goal: Home ASAP PT Goal Formulation: With  patient Time For Goal Achievement: 05/05/20 Potential to Achieve Goals: Good Progress towards PT goals: Progressing toward goals    Frequency    Min 3X/week      PT Plan Current plan remains appropriate    Co-evaluation              AM-PAC PT "6 Clicks" Mobility   Outcome Measure  Help needed turning from your back to your side while in a flat bed without using bedrails?: None Help needed moving from lying on your back to sitting on the side of a flat bed without using bedrails?: None Help needed moving to and from a bed to a chair (including a wheelchair)?: A Little Help needed standing up from a chair using your arms (e.g., wheelchair or bedside chair)?: A Little Help needed to walk in hospital room?: A Little Help needed climbing 3-5 steps with a railing? : A Little 6 Click Score: 20    End of Session   Activity Tolerance: Patient tolerated treatment well;Patient limited by fatigue Patient left: with call bell/phone within reach;with family/visitor present;in bed (NT present for washup) Nurse Communication: Mobility status PT Visit Diagnosis: Unsteadiness on feet (R26.81);Other symptoms and signs involving the nervous system (R29.898)     Time: 4315-4008 PT Time Calculation (min) (ACUTE ONLY): 14 min  Charges:  $Gait Training: 8-22 mins                    Amber Kennedy, PT Acute Rehabilitation Services Pager [phone]  Office [phone]  Amber Kennedy 05/02/2020, 12:33 PM

## 2020-05-02 NOTE — Plan of Care (Signed)
  Problem: Education: Goal: Knowledge of General Education information will improve Description: Including pain rating scale, medication(s)/side effects and non-pharmacologic comfort measures Outcome: Adequate for Discharge   

## 2020-05-02 NOTE — Progress Notes (Signed)
Discharge instructions (including medications) discussed with and copy provided to patient/caregiver 

## 2020-05-02 NOTE — Progress Notes (Signed)
Vitals were taken before d/c pt's bp was 178/86. Provider notified and Cardizem was given and pt educated on the importance of taking blood pressure at home with medication.

## 2020-05-09 ENCOUNTER — Ambulatory Visit: Payer: Medicare HMO | Admitting: Student

## 2020-05-09 ENCOUNTER — Encounter: Payer: Self-pay | Admitting: Cardiology

## 2020-05-09 DIAGNOSIS — I495 Sick sinus syndrome: Secondary | ICD-10-CM

## 2020-05-09 HISTORY — DX: Sick sinus syndrome: I49.5

## 2020-05-13 ENCOUNTER — Ambulatory Visit (INDEPENDENT_AMBULATORY_CARE_PROVIDER_SITE_OTHER): Payer: Medicare HMO | Admitting: Emergency Medicine

## 2020-05-13 ENCOUNTER — Other Ambulatory Visit: Payer: Self-pay

## 2020-05-13 DIAGNOSIS — I495 Sick sinus syndrome: Secondary | ICD-10-CM

## 2020-05-13 LAB — CUP PACEART INCLINIC DEVICE CHECK
Battery Remaining Longevity: 97 mo
Battery Voltage: 3.04 V
Brady Statistic RA Percent Paced: 91 %
Brady Statistic RV Percent Paced: 62 %
Date Time Interrogation Session: 20220419102401
Implantable Lead Implant Date: 20220406
Implantable Lead Implant Date: 20220406
Implantable Lead Location: 753859
Implantable Lead Location: 753860
Implantable Pulse Generator Implant Date: 20220406
Lead Channel Impedance Value: 512.5 Ohm
Lead Channel Impedance Value: 612.5 Ohm
Lead Channel Pacing Threshold Amplitude: 0.75 V
Lead Channel Pacing Threshold Amplitude: 0.875 V
Lead Channel Pacing Threshold Pulse Width: 0.4 ms
Lead Channel Pacing Threshold Pulse Width: 0.4 ms
Lead Channel Sensing Intrinsic Amplitude: 12 mV
Lead Channel Setting Pacing Amplitude: 1.875
Lead Channel Setting Pacing Amplitude: 3.5 V
Lead Channel Setting Pacing Pulse Width: 0.4 ms
Lead Channel Setting Sensing Sensitivity: 2 mV
Pulse Gen Model: 2272
Pulse Gen Serial Number: 3911950

## 2020-05-13 NOTE — Progress Notes (Signed)
Wound check appointment. Dermabond removed. Wound without redness or edema. Incision edges approximated, wound well healed. Normal device function. Thresholds, R wave sensing, and impedances consistent with implant measurements. Device programmed at 3.5V/auto capture programmed on for extra safety margin until 3 month visit. Histogram distribution appropriate for patient and level of activity. 17 AMS, longest 05/12/20 for 9.25 hours. V rates overall controlled. AT/AF burden 8.5%. + Eliquis. Paient educated about wound care, arm mobility, lifting restrictions. Remotes will be followed by Dr. Bonney Roussel office. 91 day follow up with Dr. Caryl Comes 08/14/20.

## 2020-05-16 ENCOUNTER — Telehealth: Payer: Self-pay | Admitting: Cardiology

## 2020-05-16 NOTE — Telephone Encounter (Signed)
Thank you. Known to have paroxysmal Afib and sinus node dysfunction. Glad to see her rate is controlled.  Thanks MJP

## 2020-05-21 ENCOUNTER — Encounter: Payer: Medicare HMO | Admitting: Vascular Surgery

## 2020-06-02 ENCOUNTER — Other Ambulatory Visit: Payer: Self-pay

## 2020-06-02 DIAGNOSIS — I10 Essential (primary) hypertension: Secondary | ICD-10-CM

## 2020-06-02 DIAGNOSIS — I480 Paroxysmal atrial fibrillation: Secondary | ICD-10-CM

## 2020-06-02 MED ORDER — DILTIAZEM HCL ER COATED BEADS 180 MG PO CP24: 180.0000 mg | ORAL_CAPSULE | Freq: Every day | ORAL | 1 refills | Status: DC

## 2020-06-02 MED ORDER — APIXABAN 5 MG PO TABS: 5.0000 mg | ORAL_TABLET | Freq: Two times a day (BID) | ORAL | 1 refills | Status: DC

## 2020-06-02 MED ORDER — METOPROLOL SUCCINATE ER 100 MG PO TB24: 100.0000 mg | ORAL_TABLET | Freq: Every day | ORAL | 2 refills | Status: DC

## 2020-06-02 MED ORDER — ROSUVASTATIN CALCIUM 20 MG PO TABS: 20.0000 mg | ORAL_TABLET | Freq: Every day | ORAL | 11 refills | Status: DC

## 2020-06-02 MED ORDER — LOSARTAN POTASSIUM 50 MG PO TABS: 50.0000 mg | ORAL_TABLET | Freq: Every day | ORAL | 1 refills | Status: DC

## 2020-06-05 ENCOUNTER — Other Ambulatory Visit: Payer: Self-pay

## 2020-06-05 ENCOUNTER — Encounter: Payer: Self-pay | Admitting: Cardiology

## 2020-06-05 ENCOUNTER — Ambulatory Visit: Payer: Medicare HMO | Admitting: Cardiology

## 2020-06-05 VITALS — BP 132/64 | HR 82 | Temp 98.5°F | Resp 16 | Ht 67.0 in | Wt 176.0 lb

## 2020-06-05 DIAGNOSIS — I10 Essential (primary) hypertension: Secondary | ICD-10-CM

## 2020-06-05 DIAGNOSIS — I495 Sick sinus syndrome: Secondary | ICD-10-CM

## 2020-06-05 DIAGNOSIS — I4819 Other persistent atrial fibrillation: Secondary | ICD-10-CM

## 2020-06-05 MED ORDER — FLECAINIDE ACETATE 50 MG PO TABS: 50.0000 mg | ORAL_TABLET | Freq: Two times a day (BID) | ORAL | 2 refills | Status: DC

## 2020-06-05 NOTE — Progress Notes (Signed)
Patient referred by Amber Arabian, MD for atrial fibrillation  Subjective:   Amber Kennedy, female    DOB: January 18, 1934, 87 y.o.   MRN: [id number]   Chief Complaint  Patient presents with  . Hypertension  . Transient Ischemic Attack  . Hospitalization Follow-up   HPI  87 y.o. Caucasian female with hypertension, hyperlipidemia, paroxysmal atrial fibrillation, sick sinu syndrome s/p pacemaker placement (04/2020), TIA (04/2020)  Patient has not had any stroke/TIA symptoms. Blood pressure is well controlled She has noticed that her heart rate remains elevated. She has ran out of flecainide. On a separate note , she has had some ear congestion and cough for which she is going to see her PCP or ENT.    Current Outpatient Medications on File Prior to Visit  Medication Sig Dispense Refill  . apixaban (ELIQUIS) 5 MG TABS tablet Take 1 tablet (5 mg total) by mouth 2 (two) times daily. 60 tablet 1  . diltiazem (CARDIZEM CD) 180 MG 24 hr capsule Take 1 capsule (180 mg total) by mouth daily. 30 capsule 1  . flecainide (TAMBOCOR) 50 MG tablet Take 1 tablet (50 mg total) by mouth every 12 (twelve) hours. 60 tablet 1  . losartan (COZAAR) 50 MG tablet Take 1 tablet (50 mg total) by mouth daily. 30 tablet 1  . metoprolol succinate (TOPROL-XL) 100 MG 24 hr tablet Take 1 tablet (100 mg total) by mouth daily. 90 tablet 2  . Multiple Vitamins-Calcium (ONE-A-DAY WOMENS FORMULA) TABS Take 1 tablet by mouth daily with breakfast.    . polyethylene glycol (MIRALAX / GLYCOLAX) 17 g packet Take 17 g by mouth daily as needed. 14 each 0  . rosuvastatin (CRESTOR) 20 MG tablet Take 1 tablet (20 mg total) by mouth daily. 30 tablet 11  . senna-docusate (SENOKOT-S) 8.6-50 MG tablet Take 1 tablet by mouth at bedtime as needed for mild constipation.    . vitamin C (ASCORBIC ACID) 500 MG tablet Take 500 mg by mouth daily as needed (for supplementation).     No current facility-administered medications on file prior to  visit.    Cardiovascular studies:  EKG 06/05/2020: Atrial fibrillation 107 bpm Occasional ectopic ventricular beat    Anterolateral T wave inversion, consider ischemia  Unscheduled (Alert) 05/16/2020: Persistent AF since 05/15/2020 with controlled ventricular response. AT/AF burden 80%.  Normal pacemaker function. RA paced 42% and RV 56%  Echocardiogram 04/28/2020: 1. Left ventricular ejection fraction, by estimation, is 60 to 65%. The  left ventricle has normal function. The left ventricle has no regional  wall motion abnormalities. There is severe left ventricular hypertrophy.  Left ventricular diastolic parameters  are indeterminate.  2. Right ventricular systolic function is normal. The right ventricular  size is normal. There is mildly elevated pulmonary artery systolic  pressure.  3. Left atrial size was moderately dilated.  4. The mitral valve is degenerative. Mild mitral valve regurgitation. No  evidence of mitral stenosis. Moderate mitral annular calcification.  5. The aortic valve was not well visualized. Aortic valve regurgitation  is not visualized. No aortic stenosis is present.  6. Aortic dilatation noted. There is mild dilatation of the ascending  aorta, measuring 38 mm.  7. The inferior vena cava is normal in size with greater than 50%  respiratory variability, suggesting right atrial pressure of 3 mmHg.   CTA head/neck 04/28/2020: 1. 60% diameter stenosis proximal left internal carotid artery due to atherosclerotic disease 2. Right carotid artery widely patent without stenosis. Both vertebral arteries  widely patent. 3. Atherosclerotic aortic arch.   EKG 03/24/2020: Sinus rhythm 67 bpm  Left axis deviation Possible old anteroseptal infarct  Echocardiogram 02/06/2017: - Left ventricle: The cavity size was normal. Wall thickness was   increased in a pattern of mild LVH. Systolic function was normal.   The estimated ejection fraction was in the range of  60% to 65%.   Wall motion was normal; there were no regional wall motion   abnormalities. Features are consistent with a pseudonormal left   ventricular filling pattern, with concomitant abnormal relaxation   and increased filling pressure (grade 2 diastolic dysfunction).   Indeterminate filling pressures. - Mitral valve: Mildly calcified annulus. - Left atrium: The atrium was mildly dilated. - Atrial septum: No defect or patent foramen ovale was identified. - Tricuspid valve: There was mild-moderate regurgitation. - Pulmonary arteries: PA peak pressure: 35 mm Hg (S). - Systemic veins: Dilated IVC with normal respiratory variation.   Estimated CVP 8 mmHg.  Recent labs: 05/01/2020: Glucose 98, BUN/Cr 11/0.80. EGFR >60. Na/K 136/3.7. Albumin 3.2. Total protein 5.7. Rest of the CMP normal H/H 12/39. MCV 92. Platelets 197 TSH 3.0 normal  03/13/2020: Glucose 88, BUN/Cr 15/0.94. EGFR 57. Na/K 137/4.5. Rest of the CMP normal HbA1C 5.6% Chol 155, TG 150, HDL 46, LDL 83  02/01/2019: Glucose 115, BUN/Cr 16/0.9. EGFR 57 HbA1C 5.3% Chol 189, TG 259, HDL 42, LDL 102 TSH 6.3 normal  12/30/2017: Glucose 94. BUN/Cr 16/0.94. eGFR 57. Na/K 138/4.6. HbA1C 5.4% Chol 175, TG 185, HDL 39, LDL 137.   Review of Systems  Cardiovascular: Positive for palpitations. Negative for chest pain, dyspnea on exertion, leg swelling and syncope.          Vitals:   06/05/20 1009 06/05/20 1014  BP: (!) 144/69 132/64  Pulse: 84 82  Resp: 16   Temp: 98.5 F (36.9 C)   SpO2: 97%       Objective:   Physical Exam Vitals and nursing note reviewed.  Constitutional:      Appearance: She is well-developed.  Neck:     Vascular: No JVD.  Cardiovascular:     Rate and Rhythm: Tachycardia present. Rhythm irregular.     Pulses: Intact distal pulses.     Heart sounds: Normal heart sounds. No murmur heard.   Pulmonary:     Effort: Pulmonary effort is normal.     Breath sounds: Normal breath sounds. No  wheezing or rales.  Musculoskeletal:     Right lower leg: No edema.     Left lower leg: No edema.             Assessment & Recommendations:   87 y.o. Caucasian female with hypertension, hyperlipidemia, paroxysmal atrial fibrillation, sick sinu syndrome s/p pacemaker placement (04/2020), TIA (04/2020)  Afib: Now persistent since 05/15/2020. She is off flecainide, which could explain the persistence. Resume Flecainide at 50 mg bid. If she remains in Afib in 2 weeks from today, will recommend cardioversion. CHA2DS2VASc score 4. Annual stroke risk 4%. Continue Xarelto 20 mg daily.  Hypertension: Well controlled  Hyperlipidemia: She is reluctant to try lipid lowering therapy, and would like to make diet and lifestlye changes.  LDL is down to 102.  F/u in 2 weeks  Nigel Mormon, MD [facility] Pager: [phone] Office: [phone] If no answer Cell [phone]

## 2020-06-06 ENCOUNTER — Ambulatory Visit: Payer: Medicare HMO | Admitting: Cardiology

## 2020-06-07 ENCOUNTER — Emergency Department (HOSPITAL_COMMUNITY): Payer: Medicare HMO

## 2020-06-07 ENCOUNTER — Other Ambulatory Visit: Payer: Self-pay

## 2020-06-07 ENCOUNTER — Inpatient Hospital Stay (HOSPITAL_COMMUNITY)
Admission: EM | Admit: 2020-06-07 | Discharge: 2020-06-14 | DRG: 641 | Disposition: A | Discharge status: Skilled Nursing Facility | Payer: Medicare HMO | Attending: Internal Medicine | Admitting: Internal Medicine

## 2020-06-07 DIAGNOSIS — I10 Essential (primary) hypertension: Secondary | ICD-10-CM | POA: Diagnosis not present

## 2020-06-07 DIAGNOSIS — Z79899 Other long term (current) drug therapy: Secondary | ICD-10-CM

## 2020-06-07 DIAGNOSIS — E860 Dehydration: Secondary | ICD-10-CM | POA: Diagnosis present

## 2020-06-07 DIAGNOSIS — Z20822 Contact with and (suspected) exposure to covid-19: Secondary | ICD-10-CM | POA: Diagnosis present

## 2020-06-07 DIAGNOSIS — H6690 Otitis media, unspecified, unspecified ear: Secondary | ICD-10-CM | POA: Diagnosis not present

## 2020-06-07 DIAGNOSIS — M109 Gout, unspecified: Secondary | ICD-10-CM | POA: Diagnosis present

## 2020-06-07 DIAGNOSIS — H6691 Otitis media, unspecified, right ear: Secondary | ICD-10-CM | POA: Diagnosis present

## 2020-06-07 DIAGNOSIS — Z880 Allergy status to penicillin: Secondary | ICD-10-CM | POA: Diagnosis not present

## 2020-06-07 DIAGNOSIS — I495 Sick sinus syndrome: Secondary | ICD-10-CM | POA: Diagnosis present

## 2020-06-07 DIAGNOSIS — R531 Weakness: Secondary | ICD-10-CM | POA: Diagnosis not present

## 2020-06-07 DIAGNOSIS — E878 Other disorders of electrolyte and fluid balance, not elsewhere classified: Secondary | ICD-10-CM | POA: Diagnosis present

## 2020-06-07 DIAGNOSIS — E785 Hyperlipidemia, unspecified: Secondary | ICD-10-CM | POA: Diagnosis present

## 2020-06-07 DIAGNOSIS — Z91040 Latex allergy status: Secondary | ICD-10-CM | POA: Diagnosis not present

## 2020-06-07 DIAGNOSIS — D492 Neoplasm of unspecified behavior of bone, soft tissue, and skin: Secondary | ICD-10-CM | POA: Diagnosis not present

## 2020-06-07 DIAGNOSIS — R4702 Dysphasia: Secondary | ICD-10-CM | POA: Diagnosis not present

## 2020-06-07 DIAGNOSIS — G510 Bell's palsy: Secondary | ICD-10-CM | POA: Diagnosis present

## 2020-06-07 DIAGNOSIS — Z87891 Personal history of nicotine dependence: Secondary | ICD-10-CM

## 2020-06-07 DIAGNOSIS — Z7901 Long term (current) use of anticoagulants: Secondary | ICD-10-CM

## 2020-06-07 DIAGNOSIS — Z950 Presence of cardiac pacemaker: Secondary | ICD-10-CM

## 2020-06-07 DIAGNOSIS — H66001 Acute suppurative otitis media without spontaneous rupture of ear drum, right ear: Secondary | ICD-10-CM | POA: Diagnosis not present

## 2020-06-07 DIAGNOSIS — Z8673 Personal history of transient ischemic attack (TIA), and cerebral infarction without residual deficits: Secondary | ICD-10-CM | POA: Diagnosis not present

## 2020-06-07 DIAGNOSIS — R2681 Unsteadiness on feet: Secondary | ICD-10-CM | POA: Diagnosis not present

## 2020-06-07 DIAGNOSIS — E7800 Pure hypercholesterolemia, unspecified: Secondary | ICD-10-CM | POA: Diagnosis present

## 2020-06-07 DIAGNOSIS — H6122 Impacted cerumen, left ear: Secondary | ICD-10-CM | POA: Diagnosis not present

## 2020-06-07 DIAGNOSIS — I480 Paroxysmal atrial fibrillation: Secondary | ICD-10-CM | POA: Diagnosis present

## 2020-06-07 DIAGNOSIS — I4891 Unspecified atrial fibrillation: Secondary | ICD-10-CM | POA: Diagnosis present

## 2020-06-07 DIAGNOSIS — Z888 Allergy status to other drugs, medicaments and biological substances status: Secondary | ICD-10-CM

## 2020-06-07 DIAGNOSIS — H6090 Unspecified otitis externa, unspecified ear: Secondary | ICD-10-CM

## 2020-06-07 DIAGNOSIS — R262 Difficulty in walking, not elsewhere classified: Secondary | ICD-10-CM | POA: Diagnosis not present

## 2020-06-07 DIAGNOSIS — H60391 Other infective otitis externa, right ear: Secondary | ICD-10-CM

## 2020-06-07 DIAGNOSIS — R5381 Other malaise: Secondary | ICD-10-CM | POA: Diagnosis not present

## 2020-06-07 DIAGNOSIS — H60501 Unspecified acute noninfective otitis externa, right ear: Secondary | ICD-10-CM | POA: Diagnosis not present

## 2020-06-07 DIAGNOSIS — E871 Hypo-osmolality and hyponatremia: Secondary | ICD-10-CM | POA: Diagnosis not present

## 2020-06-07 DIAGNOSIS — J341 Cyst and mucocele of nose and nasal sinus: Secondary | ICD-10-CM | POA: Diagnosis not present

## 2020-06-07 DIAGNOSIS — H70001 Acute mastoiditis without complications, right ear: Secondary | ICD-10-CM | POA: Diagnosis not present

## 2020-06-07 DIAGNOSIS — H608X1 Other otitis externa, right ear: Secondary | ICD-10-CM | POA: Diagnosis not present

## 2020-06-07 DIAGNOSIS — R197 Diarrhea, unspecified: Secondary | ICD-10-CM | POA: Diagnosis present

## 2020-06-07 DIAGNOSIS — H6091 Unspecified otitis externa, right ear: Secondary | ICD-10-CM | POA: Diagnosis not present

## 2020-06-07 DIAGNOSIS — R4781 Slurred speech: Secondary | ICD-10-CM | POA: Diagnosis not present

## 2020-06-07 DIAGNOSIS — R29810 Facial weakness: Secondary | ICD-10-CM | POA: Diagnosis not present

## 2020-06-07 DIAGNOSIS — R000 Tachycardia, unspecified: Secondary | ICD-10-CM | POA: Diagnosis not present

## 2020-06-07 LAB — CBC WITH DIFFERENTIAL/PLATELET
Abs Immature Granulocytes: 0.05 10*3/uL (ref 0.00–0.07)
Basophils Absolute: 0 10*3/uL (ref 0.0–0.1)
Basophils Relative: 0 %
Eosinophils Absolute: 0 10*3/uL (ref 0.0–0.5)
Eosinophils Relative: 0 %
HCT: 43.1 % (ref 36.0–46.0)
Hemoglobin: 14.8 g/dL (ref 12.0–15.0)
Immature Granulocytes: 1 %
Lymphocytes Relative: 22 %
Lymphs Abs: 2.2 10*3/uL (ref 0.7–4.0)
MCH: 30.2 pg (ref 26.0–34.0)
MCHC: 34.3 g/dL (ref 30.0–36.0)
MCV: 88 fL (ref 80.0–100.0)
Monocytes Absolute: 0.8 10*3/uL (ref 0.1–1.0)
Monocytes Relative: 8 %
Neutro Abs: 6.7 10*3/uL (ref 1.7–7.7)
Neutrophils Relative %: 69 %
Platelets: 184 10*3/uL (ref 150–400)
RBC: 4.9 MIL/uL (ref 3.87–5.11)
RDW: 12.4 % (ref 11.5–15.5)
WBC: 9.7 10*3/uL (ref 4.0–10.5)
nRBC: 0 % (ref 0.0–0.2)

## 2020-06-07 LAB — CREATININE, URINE, RANDOM: Creatinine, Urine: 46.24 mg/dL

## 2020-06-07 LAB — BASIC METABOLIC PANEL
Anion gap: 10 (ref 5–15)
BUN: 15 mg/dL (ref 8–23)
CO2: 19 mmol/L — ABNORMAL LOW (ref 22–32)
Calcium: 8.9 mg/dL (ref 8.9–10.3)
Chloride: 96 mmol/L — ABNORMAL LOW (ref 98–111)
Creatinine, Ser: 0.9 mg/dL (ref 0.44–1.00)
GFR, Estimated: >60 mL/min (ref >60)
Glucose, Bld: 137 mg/dL — ABNORMAL HIGH (ref 70–99)
Potassium: 4.2 mmol/L (ref 3.5–5.1)
Sodium: 125 mmol/L — ABNORMAL LOW (ref 135–145)

## 2020-06-07 LAB — COMPREHENSIVE METABOLIC PANEL
ALT: 18 U/L (ref 0–44)
AST: 34 U/L (ref 15–41)
Albumin: 3.9 g/dL (ref 3.5–5.0)
Alkaline Phosphatase: 71 U/L (ref 38–126)
Anion gap: 12 (ref 5–15)
BUN: 15 mg/dL (ref 8–23)
CO2: 18 mmol/L — ABNORMAL LOW (ref 22–32)
Calcium: 9.1 mg/dL (ref 8.9–10.3)
Chloride: 91 mmol/L — ABNORMAL LOW (ref 98–111)
Creatinine, Ser: 0.97 mg/dL (ref 0.44–1.00)
GFR, Estimated: 57 mL/min — ABNORMAL LOW (ref >60)
Glucose, Bld: 124 mg/dL — ABNORMAL HIGH (ref 70–99)
Potassium: 4.4 mmol/L (ref 3.5–5.1)
Sodium: 121 mmol/L — ABNORMAL LOW (ref 135–145)
Total Bilirubin: 1.8 mg/dL — ABNORMAL HIGH (ref 0.3–1.2)
Total Protein: 7.4 g/dL (ref 6.5–8.1)

## 2020-06-07 LAB — TROPONIN I (HIGH SENSITIVITY)
Troponin I (High Sensitivity): 7 ng/L (ref <18)
Troponin I (High Sensitivity): <2 ng/L (ref <18)

## 2020-06-07 LAB — URINALYSIS, ROUTINE W REFLEX MICROSCOPIC
Bilirubin Urine: NEGATIVE
Glucose, UA: NEGATIVE mg/dL
Hgb urine dipstick: NEGATIVE
Ketones, ur: NEGATIVE mg/dL
Leukocytes,Ua: NEGATIVE
Nitrite: NEGATIVE
Protein, ur: NEGATIVE mg/dL
Specific Gravity, Urine: 1.008 (ref 1.005–1.030)
pH: 6 (ref 5.0–8.0)

## 2020-06-07 LAB — PROTIME-INR
INR: 1.1 (ref 0.8–1.2)
Prothrombin Time: 14.1 seconds (ref 11.4–15.2)

## 2020-06-07 LAB — SARS CORONAVIRUS 2 (TAT 6-24 HRS): SARS Coronavirus 2: NEGATIVE

## 2020-06-07 LAB — C-REACTIVE PROTEIN: CRP: <0.5 mg/dL (ref <1.0)

## 2020-06-07 LAB — SEDIMENTATION RATE: Sed Rate: 10 mm/hr (ref 0–22)

## 2020-06-07 LAB — SODIUM, URINE, RANDOM: Sodium, Ur: 29 mmol/L

## 2020-06-07 IMAGING — CT CT HEAD W/O CM
4 series · 16 of 47 positions shown, 18 images · non-contrast
Comparison: [DATE] CT head and MRI brain.

CLINICAL DATA: 87-year-old presenting with a possible RIGHT facial
droop, RIGHT facial numbness and slurred speech. Personal history of
a prior TIA.

EXAM:
CT HEAD WITHOUT CONTRAST
TECHNIQUE: Contiguous axial images were obtained from the base of the skull
through the vertex without intravenous contrast.

[Series 3: head without · axial · non-contrast · 0.40mm/px · z∈[-107,+3]mm · 7 of 30 slices shown, 9 images]
[im 4/30  brain]
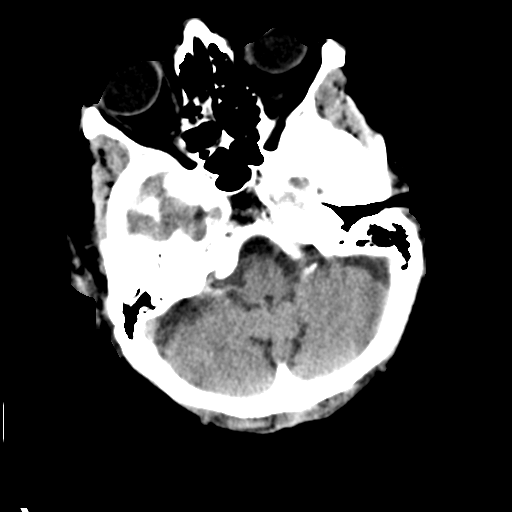
[im 4/30  bone]
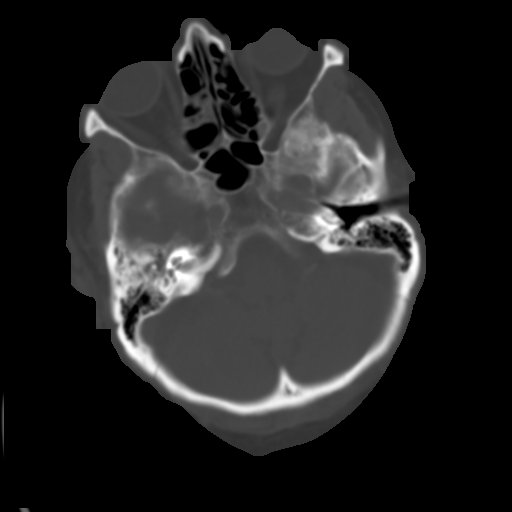
[im 8/30  brain]
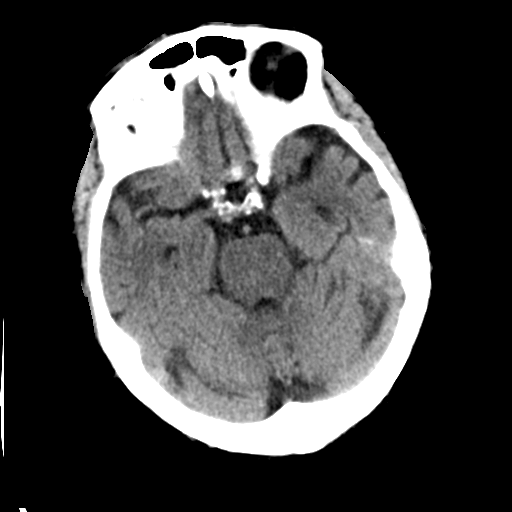
[im 11/30  brain]
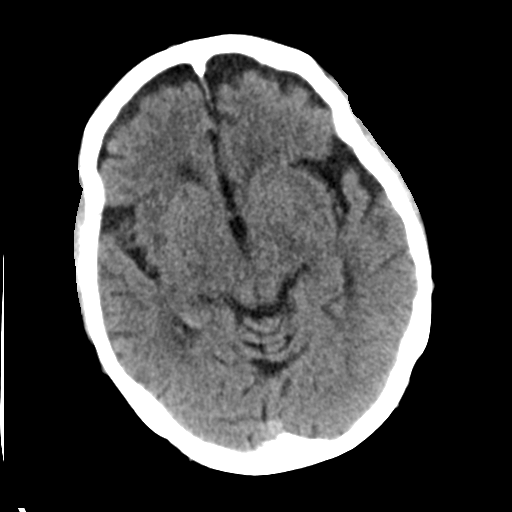
[im 15/30  brain]
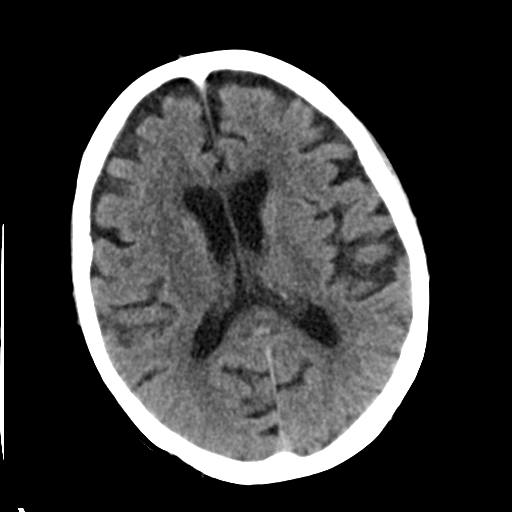
[im 19/30  brain]
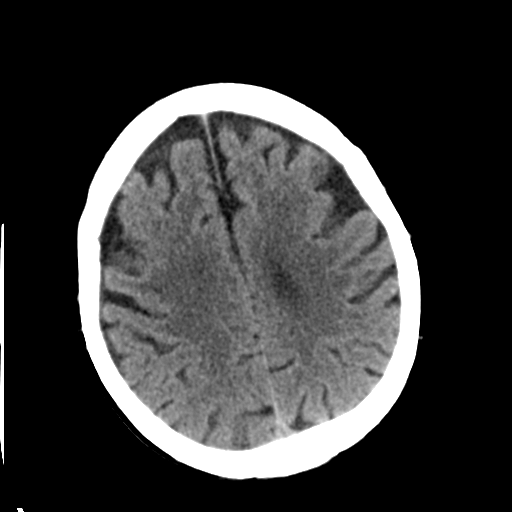
[im 19/30  bone]
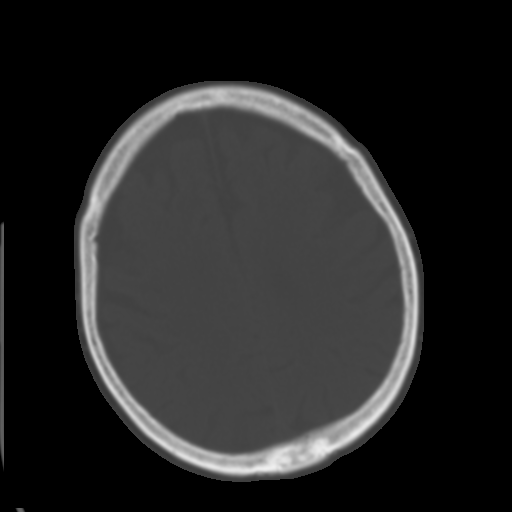
[im 22/30  brain]
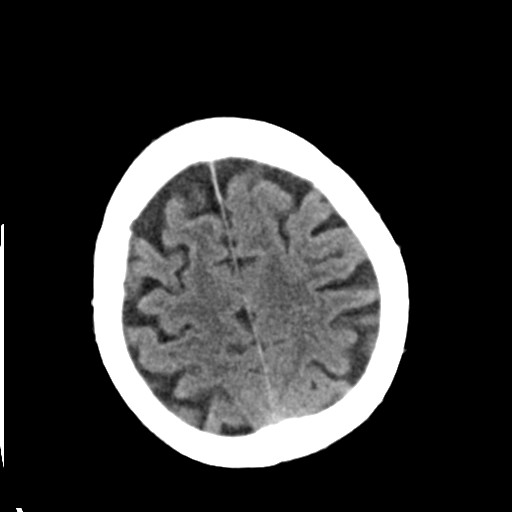
[im 26/30  brain]
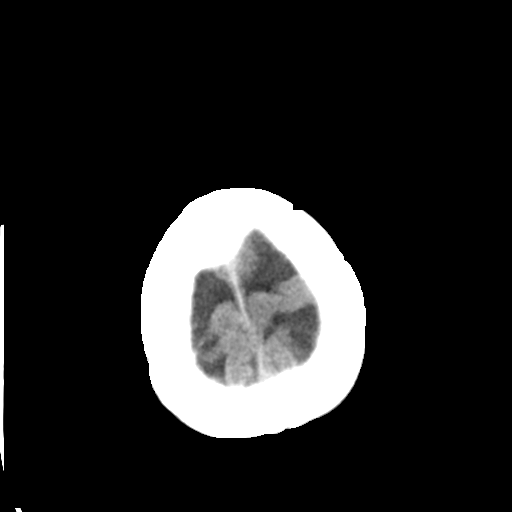

[Series 4: head bone · axial · 0.40mm/px · z∈[-108,-80]mm · 3 of 73 slices shown]
[im 8/73  bone]
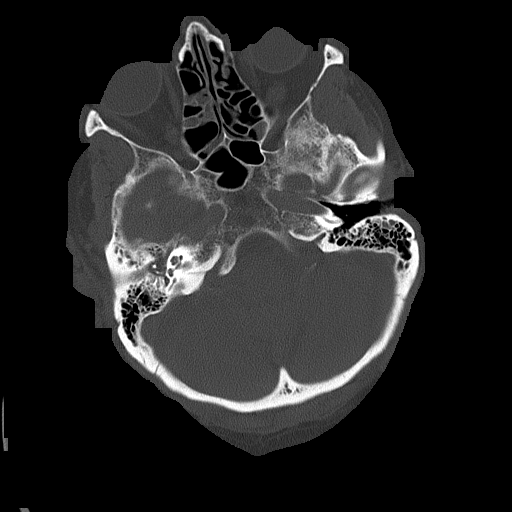
[im 15/73  bone]
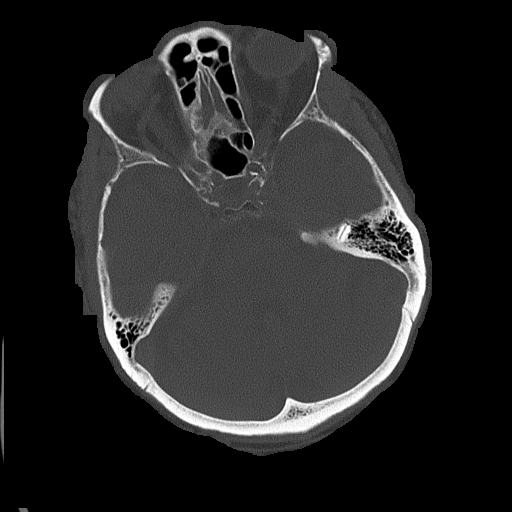
[im 22/73  bone]
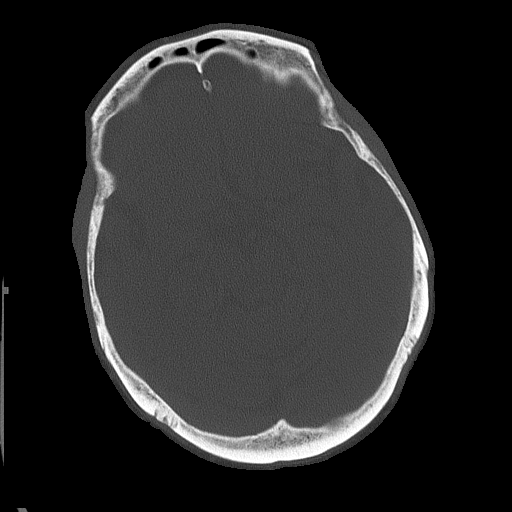

[Series 5: head without cor · coronal · non-contrast · 0.29mm/px · 3 of 59 slices shown]
[im 20/59  brain]
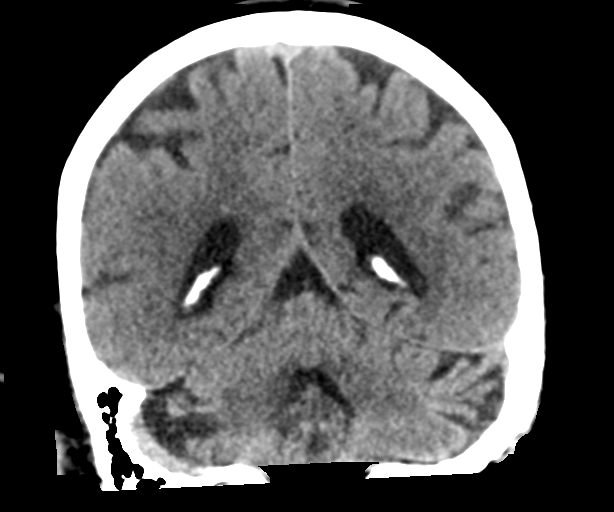
[im 26/59  brain]
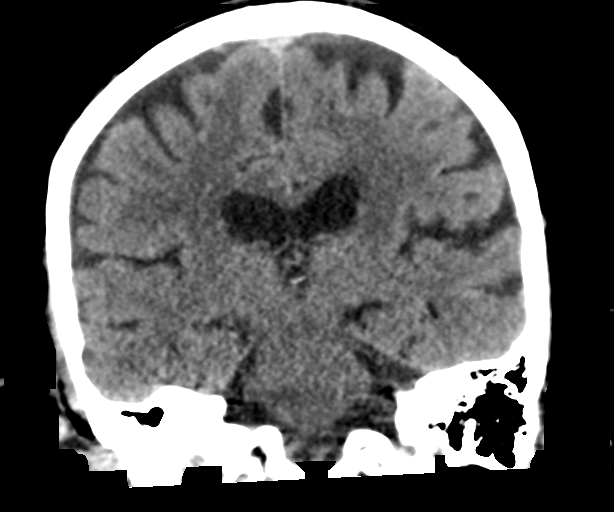
[im 33/59  brain]
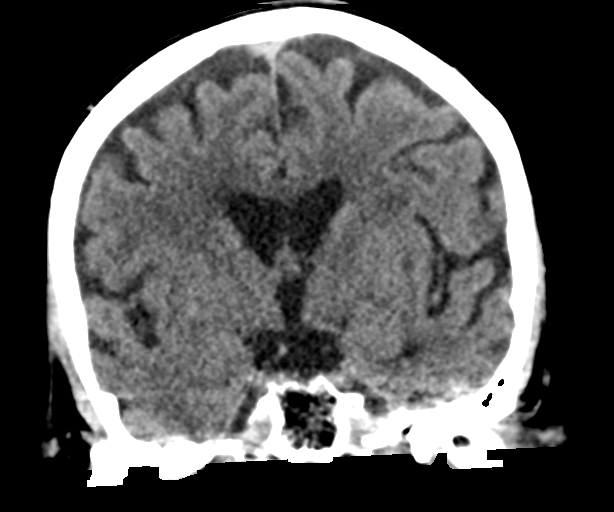

[Series 6: head without sag · sagittal · non-contrast · 0.29mm/px · 3 of 54 slices shown]
[im 18/54  brain]
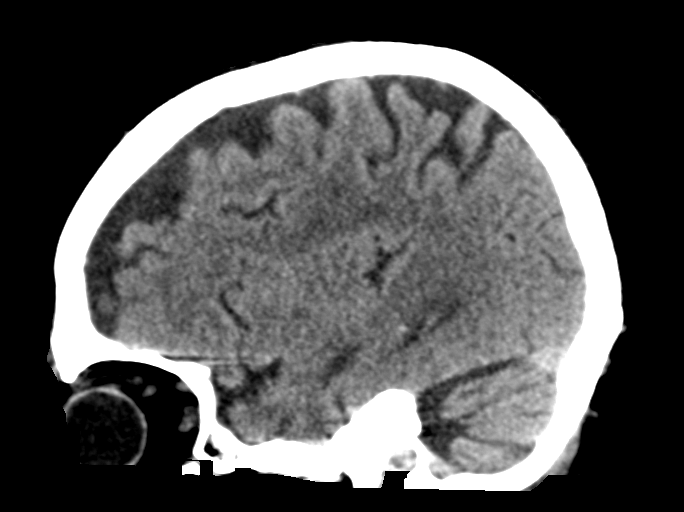
[im 27/54  brain]
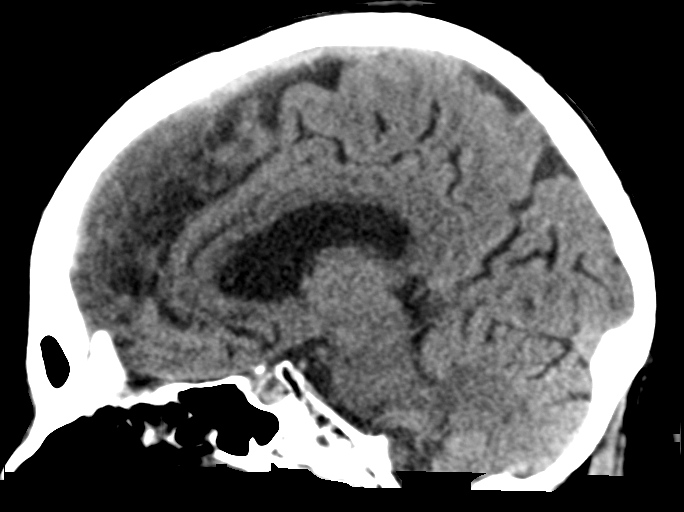
[im 36/54  brain]
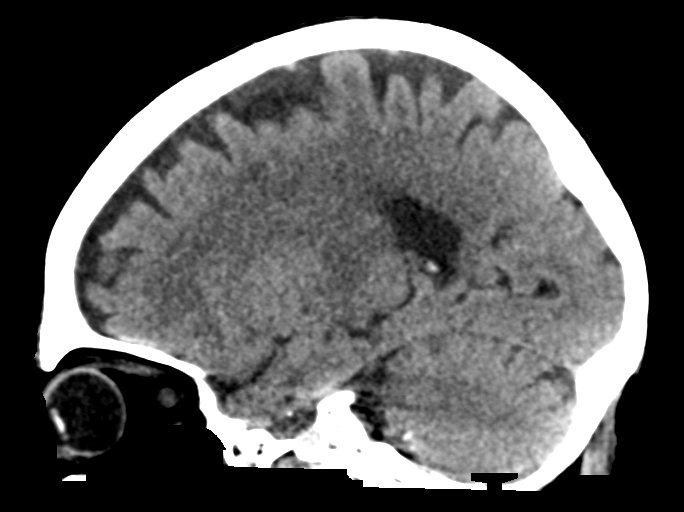

[16 of 47 positions shown; findings below may reference images not displayed]

FINDINGS: Brain: Ventricular system normal in size and appearance for age.
Mild moderate age-appropriate cortical atrophy. Mild to moderate
changes of small vessel disease of the white matter diffusely. No
mass lesion. No midline shift. No acute hemorrhage or hematoma. No
extra-axial fluid collections. No evidence of acute infarction.

Vascular: Severe BILATERAL carotid siphon and mild BILATERAL
vertebral artery atherosclerosis. No hyperdense vessel.

Skull: No skull fracture or other focal osseous abnormality
involving the skull.

Sinuses/Orbits: Opacification of scattered ethmoid air cells
bilaterally, slightly increased since the prior CT. Remaining
visualized paranasal sinuses, BILATERAL mastoid air cells and
BILATERAL middle ear cavities well-aerated.

Other: None.
IMPRESSION: 1. No acute intracranial abnormality.
2. Mild to moderate age appropriate cortical atrophy and
mild-to-moderate chronic microvascular ischemic changes of the white
matter.
3. Mild BILATERAL ethmoid sinus disease.

## 2020-06-07 IMAGING — CT CT TEMPORAL BONES W/O CM
2 of 7 series · 13 of 40 positions shown, 16 images · non-contrast
Comparison: No pertinent prior exam.

CLINICAL DATA: Neoplasm, auditory canal (internal or external).
Right ear infection, concern for malignant external otitis, concern
for bony erosion/bony involvement.

EXAM:
CT TEMPORAL BONES WITHOUT CONTRAST
TECHNIQUE: Axial and coronal plane CT imaging of the petrous temporal bones was
performed with thin-collimation image reconstruction. No intravenous
contrast was administered. Multiplanar CT image reconstructions were
also generated.

[Series 9: axial mag right · axial · 0.18mm/px · z∈[-159,-104]mm · 11 of 111 slices shown, 14 images]
[im 10/111  brain]
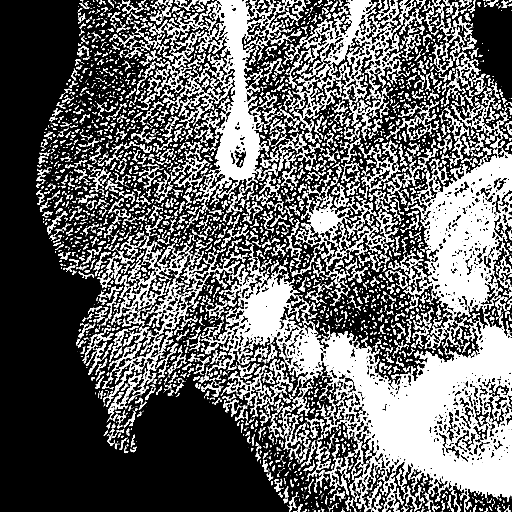
[im 10/111  bone]
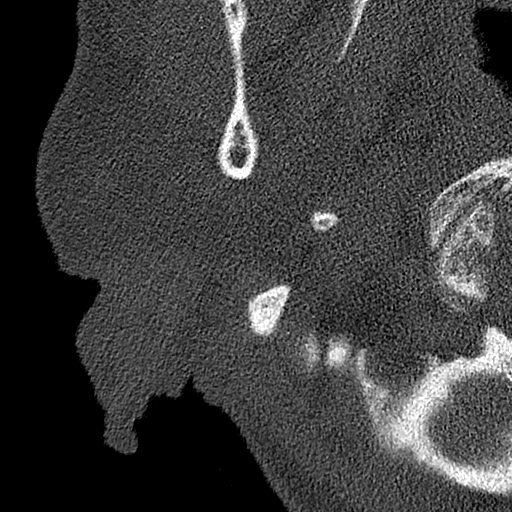
[im 19/111  bone]
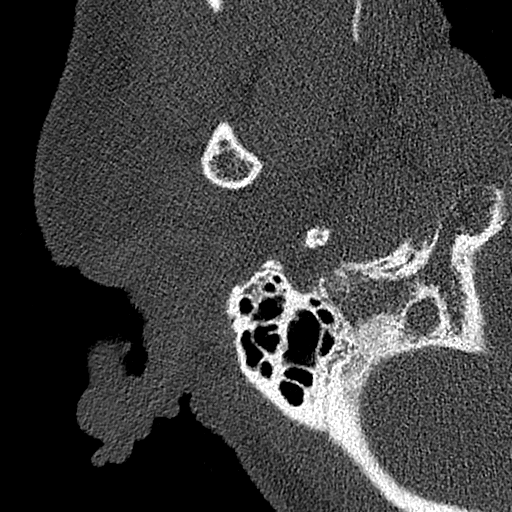
[im 28/111  bone]
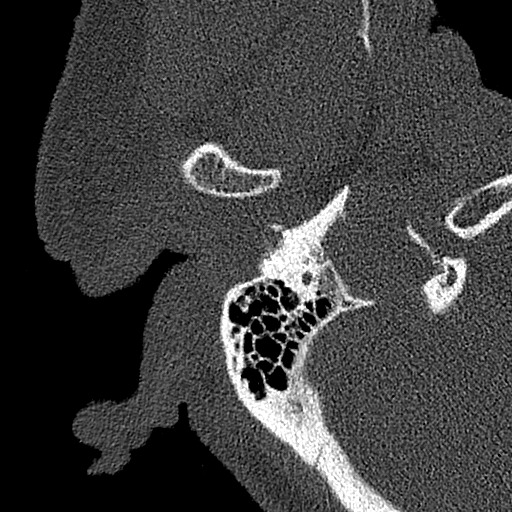
[im 37/111  bone]
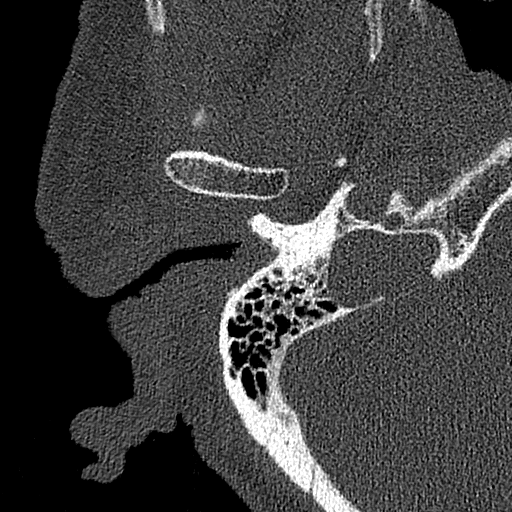
[im 46/111  brain]
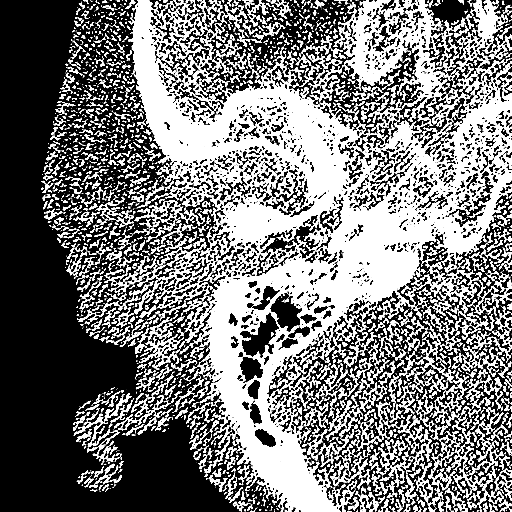
[im 46/111  bone]
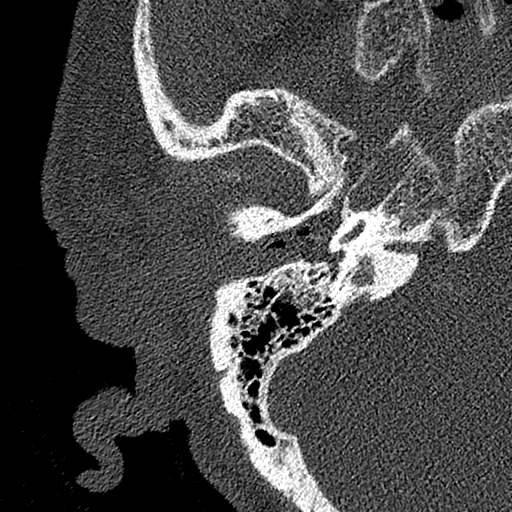
[im 56/111  bone]
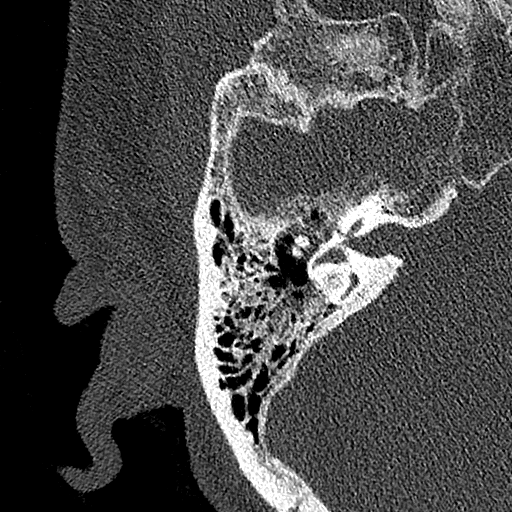
[im 65/111  bone]
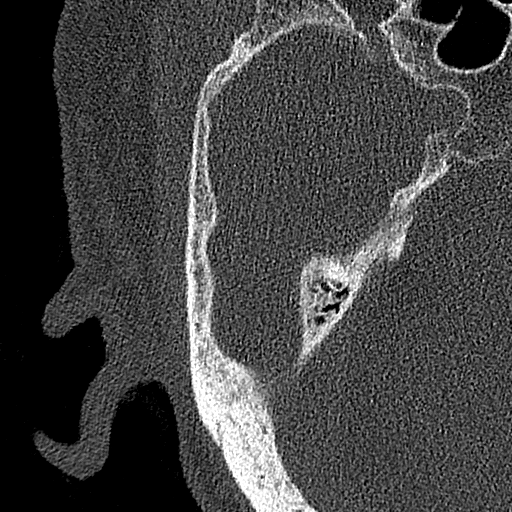
[im 74/111  bone]
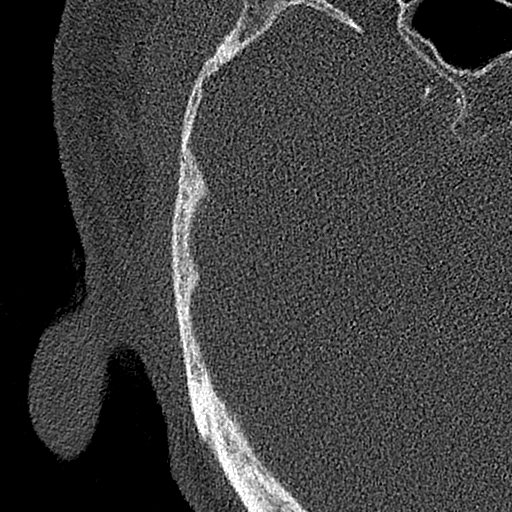
[im 83/111  brain]
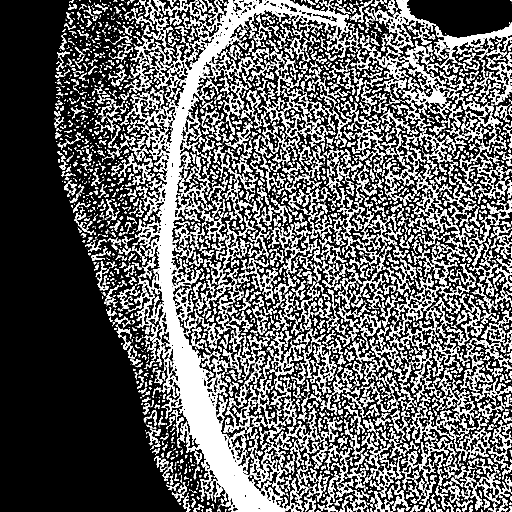
[im 83/111  bone]
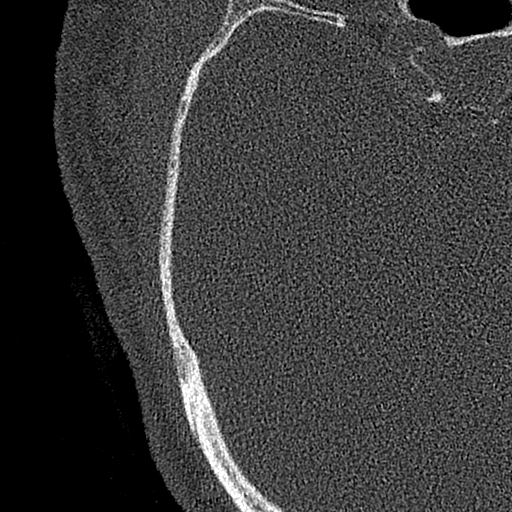
[im 92/111  bone]
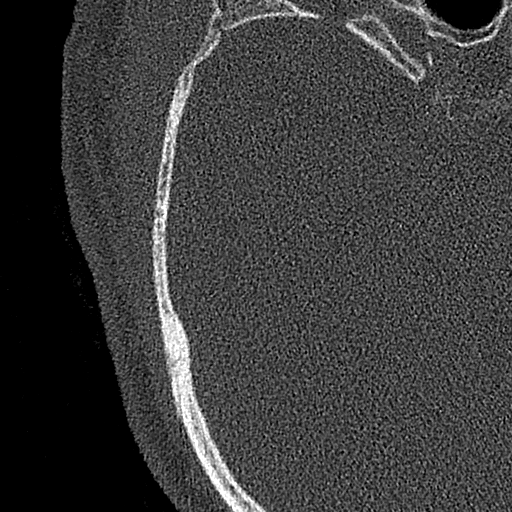
[im 101/111  bone]
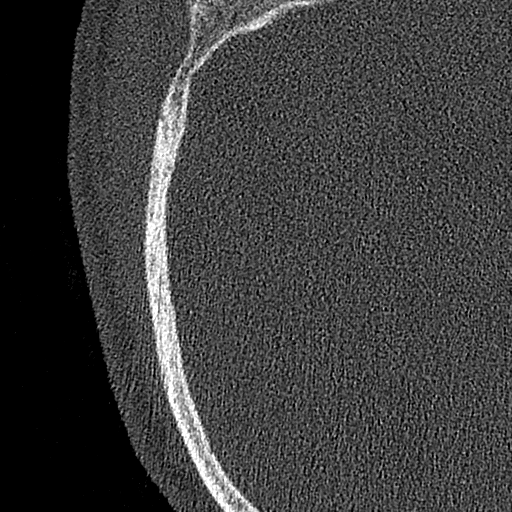

[Series 13: full fov cor bone · coronal · 0.14mm/px · 2 of 248 slices shown]
[im 83/248  bone]
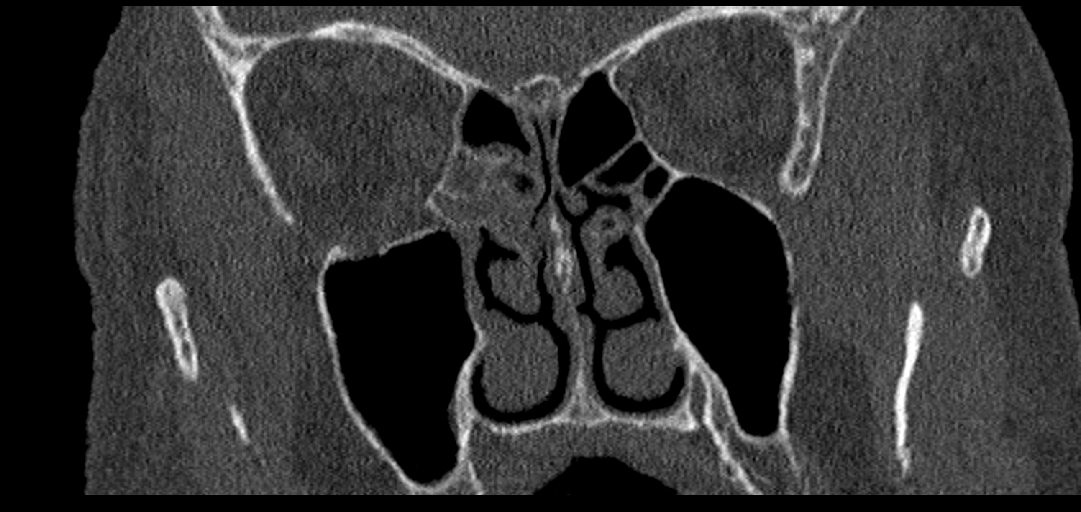
[im 165/248  bone]
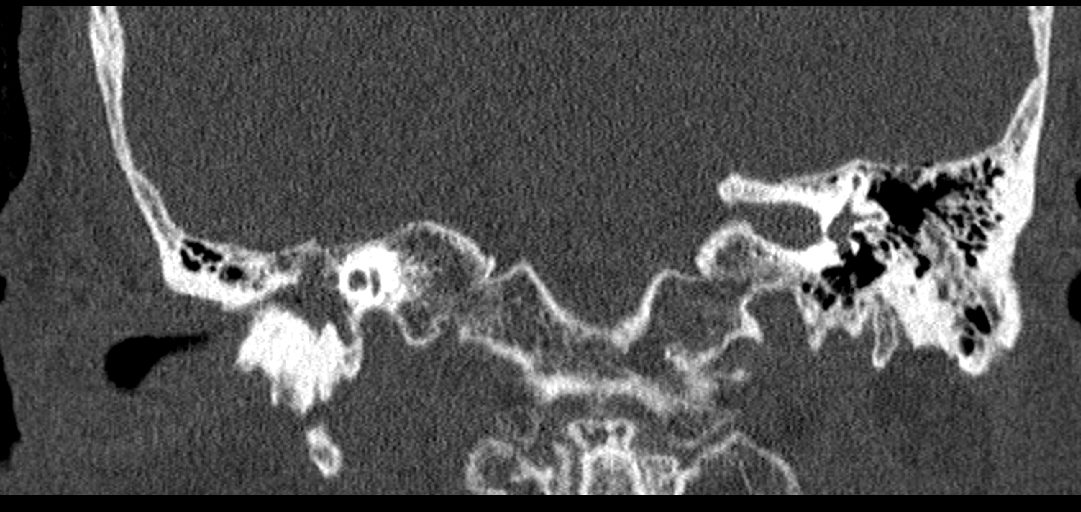

[13 of 40 positions shown; findings below may reference images not displayed]

FINDINGS: RIGHT TEMPORAL BONE

External auditory canal: Extensive partial opacification of the
external auditory canal. Associated soft tissue thickening of the
external auditory canal. No definite bony erosion of the external
auditory canal is appreciated.

Middle ear cavity: Extensive partial opacification of the middle ear
cavity. No definite ossicular erosion is appreciated. The scutum
remain sharp.

Inner ear structures: The cochlea, vestibule and semicircular canals
are normal. The vestibular aqueduct is not enlarged.

Internal auditory and facial nerve canals:  Normal

Mastoid air cells: Moderate partial opacification of the mastoid air
cells without appreciable coalescent osseous erosion.

LEFT TEMPORAL BONE

External auditory canal: Nonspecific mild partial opacification of
the external auditory canal.

Middle ear cavity: Normally aerated. The ossicles are unremarkable.
The scutum is sharp.

Inner ear structures: The cochlea, vestibule and semicircular canals
are normal. The vestibular aqueduct is not enlarged.

Internal auditory and facial nerve canals:  Normal.

Mastoid air cells: Small left mastoid effusion without appreciable
coalescent osseous erosion.

Limited intracranial:  Reported separately

Visible orbits/paranasal sinuses: No acute or significant orbital
finding. Small right maxillary sinus mucous retention cyst.
IMPRESSION: On the right, there is extensive partial opacification of the right
external auditory canal and associated soft tissue thickening along
the external auditory canal. There is also extensive partial
opacification of the middle ear cavity and a moderate right mastoid
effusion. No appreciable osseous erosion. Findings are nonspecific
and may reflect external otitis, otitis media and mastoiditis.
Neoplasm is difficult to definitively exclude. Correlate with
findings on otoscopic examination.

On the left, there is mild nonspecific partial opacification of the
external auditory canal. A small left mastoid effusion is also
present. No appreciable osseous erosion. Correlate with findings on
other otoscopic examination.

## 2020-06-07 MED ORDER — CIPROFLOXACIN-DEXAMETHASONE 0.3-0.1 % OT SUSP
4.0000 [drp] | Freq: Two times a day (BID) | OTIC | Status: AC
  Administered 2020-06-07 – 2020-06-13 (×13): 4 [drp] via OTIC
  Filled 2020-06-07: qty 7.5

## 2020-06-07 MED ORDER — METOPROLOL SUCCINATE ER 100 MG PO TB24
100.0000 mg | ORAL_TABLET | Freq: Every day | ORAL | Status: DC
  Administered 2020-06-07 – 2020-06-14 (×8): 100 mg via ORAL
  Filled 2020-06-07 (×8): qty 1
  Filled 2020-06-07: qty 4

## 2020-06-07 MED ORDER — POLYVINYL ALCOHOL 1.4 % OP SOLN
1.0000 [drp] | OPHTHALMIC | Status: DC | PRN
  Administered 2020-06-07 – 2020-06-12 (×4): 1 [drp] via OPHTHALMIC
  Filled 2020-06-07: qty 15

## 2020-06-07 MED ORDER — ARTIFICIAL TEARS OPHTHALMIC OINT
1.0000 "application " | TOPICAL_OINTMENT | Freq: Three times a day (TID) | OPHTHALMIC | Status: DC
  Administered 2020-06-07 – 2020-06-14 (×21): 1 via OPHTHALMIC
  Filled 2020-06-07: qty 3.5

## 2020-06-07 MED ORDER — POLYETHYLENE GLYCOL 3350 17 G PO PACK
17.0000 g | PACK | Freq: Every day | ORAL | Status: DC | PRN
  Administered 2020-06-11 – 2020-06-13 (×3): 17 g via ORAL
  Filled 2020-06-07 (×3): qty 1

## 2020-06-07 MED ORDER — ONDANSETRON HCL 4 MG/2ML IJ SOLN
4.0000 mg | Freq: Four times a day (QID) | INTRAMUSCULAR | Status: DC | PRN
Start: 2020-06-07 — End: 2020-06-14

## 2020-06-07 MED ORDER — DILTIAZEM HCL ER COATED BEADS 180 MG PO CP24
180.0000 mg | ORAL_CAPSULE | Freq: Every day | ORAL | Status: DC
  Administered 2020-06-07 – 2020-06-14 (×8): 180 mg via ORAL
  Filled 2020-06-07 (×8): qty 1

## 2020-06-07 MED ORDER — ACETAMINOPHEN 325 MG PO TABS: 650.0000 mg | ORAL_TABLET | Freq: Four times a day (QID) | ORAL | Status: DC | PRN

## 2020-06-07 MED ORDER — CEFDINIR 300 MG PO CAPS
300.0000 mg | ORAL_CAPSULE | Freq: Two times a day (BID) | ORAL | Status: DC
  Administered 2020-06-07: 300 mg via ORAL
  Filled 2020-06-07 (×2): qty 1

## 2020-06-07 MED ORDER — VALACYCLOVIR HCL 500 MG PO TABS
1000.0000 mg | ORAL_TABLET | Freq: Three times a day (TID) | ORAL | Status: DC
  Administered 2020-06-07 – 2020-06-14 (×20): 1000 mg via ORAL
  Filled 2020-06-07 (×20): qty 2

## 2020-06-07 MED ORDER — FLECAINIDE ACETATE 50 MG PO TABS
50.0000 mg | ORAL_TABLET | Freq: Two times a day (BID) | ORAL | Status: DC
  Administered 2020-06-07 – 2020-06-14 (×14): 50 mg via ORAL
  Filled 2020-06-07 (×14): qty 1

## 2020-06-07 MED ORDER — PREDNISONE 20 MG PO TABS
40.0000 mg | ORAL_TABLET | Freq: Once | ORAL | Status: AC
  Administered 2020-06-07: 40 mg via ORAL
  Filled 2020-06-07: qty 2

## 2020-06-07 MED ORDER — METOPROLOL SUCCINATE ER 100 MG PO TB24: 100.0000 mg | ORAL_TABLET | Freq: Every day | ORAL | Status: DC

## 2020-06-07 MED ORDER — ROSUVASTATIN CALCIUM 20 MG PO TABS
20.0000 mg | ORAL_TABLET | Freq: Every day | ORAL | Status: DC
  Administered 2020-06-07 – 2020-06-14 (×8): 20 mg via ORAL
  Filled 2020-06-07 (×8): qty 1

## 2020-06-07 MED ORDER — SODIUM CHLORIDE 0.9 % IV SOLN
2.0000 g | INTRAVENOUS | Status: DC
  Administered 2020-06-07 – 2020-06-13 (×7): 2 g via INTRAVENOUS
  Filled 2020-06-07: qty 20
  Filled 2020-06-07 (×2): qty 2
  Filled 2020-06-07: qty 20
  Filled 2020-06-07: qty 2
  Filled 2020-06-07: qty 20
  Filled 2020-06-07 (×2): qty 2

## 2020-06-07 MED ORDER — LOSARTAN POTASSIUM 50 MG PO TABS
50.0000 mg | ORAL_TABLET | Freq: Every day | ORAL | Status: DC
  Administered 2020-06-07 – 2020-06-14 (×8): 50 mg via ORAL
  Filled 2020-06-07 (×8): qty 1

## 2020-06-07 MED ORDER — ACETAMINOPHEN 650 MG RE SUPP: 650.0000 mg | Freq: Four times a day (QID) | RECTAL | Status: DC | PRN

## 2020-06-07 MED ORDER — ONDANSETRON HCL 4 MG PO TABS: 4.0000 mg | ORAL_TABLET | Freq: Four times a day (QID) | ORAL | Status: DC | PRN

## 2020-06-07 MED ORDER — ONDANSETRON HCL 4 MG/2ML IJ SOLN
4.0000 mg | Freq: Once | INTRAMUSCULAR | Status: AC
  Administered 2020-06-07: 4 mg via INTRAVENOUS
  Filled 2020-06-07: qty 2

## 2020-06-07 MED ORDER — ARTIFICIAL TEARS OPHTHALMIC OINT
TOPICAL_OINTMENT | OPHTHALMIC | Status: DC | PRN
  Filled 2020-06-07: qty 3.5

## 2020-06-07 MED ORDER — SODIUM CHLORIDE 0.9 % IV BOLUS
1000.0000 mL | Freq: Once | INTRAVENOUS | Status: AC
  Administered 2020-06-07: 1000 mL via INTRAVENOUS

## 2020-06-07 MED ORDER — VALACYCLOVIR HCL 500 MG PO TABS
1000.0000 mg | ORAL_TABLET | Freq: Three times a day (TID) | ORAL | Status: DC
  Administered 2020-06-07: 1000 mg via ORAL
  Filled 2020-06-07 (×2): qty 2

## 2020-06-07 NOTE — ED Notes (Signed)
Patient transported to CT 

## 2020-06-07 NOTE — ED Triage Notes (Signed)
Pt brought to ED via EMS from home with c/o right sided facial droop, slurred speech. LKW estimated 8PM yesterday. Pt alert and oriented x 4 on arrival to ED, strong grips bilaterally, talking on phone in exam room. Hx TIA, afib, pacemaker.  EMS v/s: 180/100 100 HR 129 CBG

## 2020-06-07 NOTE — ED Provider Notes (Signed)
[facility] EMERGENCY DEPARTMENT Provider Note   CSN: [id number] Arrival date & time: 06/07/20  1040     History Chief Complaint  Patient presents with  . Altered Mental Status    Amber Kennedy is a 87 y.o. female.  Presenting to the emergency room with concern for facial droop.  Patient reports that she thinks this started sometime yesterday.  She is unsure of when exactly it started.  She did not see anybody during the day.  She denies any change in her speech.  No vision changes, no numbness weakness or tingling elsewhere in her body or extremities.  She states that she has noted some drainage from her right ear over the past few days as well.  Tried cleaning it out with hydrogen peroxide.  States that a long time ago she was diagnosed with a fungal infection in her right ear.  No fevers to her knowledge.  Has had some nausea for the past couple days, a couple episodes of nonbloody nonbilious emesis.  No associated abdominal pain or chest pain.  Extensive past medical history including atrial fibrillation, tachybradycardia syndrome/sick sinus syndrome recent admission for TIA and pacemaker placement.  Patient states that she has been compliant with all of her medication.  HPI     Past Medical History:  Diagnosis Date  . A-fib ([place])   . Gout   . Hypercholesterolemia   . Hypertension   . Hypokalemia   . Hyponatremia   . Pacemaker St. Jude Assurity MRI dual chamber pacemaker 04/30/2020 04/30/2020  . Sick sinus syndrome ([place]) 05/09/2020  . Vision loss     Patient Active Problem List   Diagnosis Date Noted  . Sick sinus syndrome ([place]) 05/09/2020  . Pacemaker St. Jude Assurity MRI dual chamber pacemaker 04/30/2020 04/30/2020  . Carotid stenosis, left   . TIA (transient ischemic attack) 04/27/2020  . Bradycardia 04/27/2020  . Leg edema 09/20/2018  . Paroxysmal atrial fibrillation ([place]) 09/20/2018  . Atrial fibrillation with RVR ([place]) 02/05/2017  . Essential  hypertension 02/05/2017  . Hyponatremia 02/05/2017  . Hypokalemia 02/05/2017    Past Surgical History:  Procedure Laterality Date  . [place]  . PACEMAKER IMPLANT N/A 04/30/2020   Procedure: PACEMAKER IMPLANT;  Surgeon: Deboraha Sprang, MD;  Location: [facility];  Service: Cardiovascular;  Laterality: N/A;     OB History   No obstetric history on file.     Family History  Problem Relation Age of Onset  . Sudden death Father   . Sudden Cardiac Death Neg Hx     Social History   Tobacco Use  . Smoking status: Former Smoker    Packs/day: 0.50    Years: 35.00    Pack years: 17.50    Types: Cigarettes    Quit date: 2004    Years since quitting: 18.3  . Smokeless tobacco: Never Used  Vaping Use  . Vaping Use: Never used  Substance Use Topics  . Alcohol use: No  . Drug use: No    Home Medications Prior to Admission medications   Medication Sig Start Date End Date Taking? Authorizing Provider  apixaban (ELIQUIS) 5 MG TABS tablet Take 1 tablet (5 mg total) by mouth 2 (two) times daily. 06/02/20  Yes Patwardhan, Manish J, MD  diltiazem (CARDIZEM CD) 180 MG 24 hr capsule Take 1 capsule (180 mg total) by mouth daily. 06/02/20  Yes Patwardhan, Reynold Bowen, MD  flecainide (TAMBOCOR) 50 MG tablet Take  1 tablet (50 mg total) by mouth every 12 (twelve) hours. 06/05/20  Yes Patwardhan, Manish J, MD  losartan (COZAAR) 50 MG tablet Take 1 tablet (50 mg total) by mouth daily. 06/02/20  Yes Patwardhan, Manish J, MD  metoprolol succinate (TOPROL-XL) 100 MG 24 hr tablet Take 1 tablet (100 mg total) by mouth daily. 06/02/20  Yes Patwardhan, Manish J, MD  Multiple Vitamins-Calcium (ONE-A-DAY WOMENS FORMULA) TABS Take 1 tablet by mouth daily with breakfast.   Yes [provider]  polyethylene glycol (MIRALAX / GLYCOLAX) 17 g packet Take 17 g by mouth daily as needed. 05/02/20  Yes Hosie Poisson, MD  rosuvastatin (CRESTOR) 20 MG tablet Take 1 tablet (20 mg total) by mouth daily.  06/02/20 06/02/21 Yes Patwardhan, Manish J, MD  senna-docusate (SENOKOT-S) 8.6-50 MG tablet Take 1 tablet by mouth at bedtime as needed for mild constipation. 05/01/20  Yes Hosie Poisson, MD  vitamin C (ASCORBIC ACID) 500 MG tablet Take 500 mg by mouth daily as needed (for supplementation).   Yes [provider]    Allergies    Allopurinol, Amlodipine besylate, Penicillins, Albuterol, Latex, and Penicillin g  Review of Systems   Review of Systems  Constitutional: Positive for fatigue. Negative for chills and fever.  HENT: Negative for ear pain and sore throat.   Eyes: Negative for pain and visual disturbance.  Respiratory: Negative for cough and shortness of breath.   Cardiovascular: Negative for chest pain and palpitations.  Gastrointestinal: Positive for nausea and vomiting. Negative for abdominal pain.  Genitourinary: Negative for dysuria and hematuria.  Musculoskeletal: Negative for arthralgias and back pain.  Skin: Negative for color change and rash.  Neurological: Positive for facial asymmetry. Negative for seizures and syncope.  All other systems reviewed and are negative.   Physical Exam Updated Vital Signs BP 136/73   Pulse 75   Temp 98.5 F (36.9 C) (Oral)   Resp 19   Ht 5\' 7"  (1.702 m)   Wt 79.8 kg   SpO2 96%   BMI 27.57 kg/m   Physical Exam Vitals and nursing note reviewed.  Constitutional:      General: She is not in acute distress.    Appearance: She is well-developed.  HENT:     Head: Normocephalic and atraumatic.  Eyes:     Conjunctiva/sclera: Conjunctivae normal.  Cardiovascular:     Rate and Rhythm: Normal rate and regular rhythm.     Heart sounds: No murmur heard.   Pulmonary:     Effort: Pulmonary effort is normal. No respiratory distress.     Breath sounds: Normal breath sounds.  Abdominal:     Palpations: Abdomen is soft.     Tenderness: There is no abdominal tenderness.  Musculoskeletal:        General: No deformity or signs of  injury.     Cervical back: Neck supple.  Skin:    General: Skin is warm and dry.  Neurological:     Mental Status: She is alert.     Comments: AAOx3 CN 2-12 intact except for obvious right facial droop, forehead is involved speech clear visual fields intact 5/5 strength in b/l UE and LE Sensation to light touch intact in b/l UE and LE Normal FNF   Psychiatric:        Mood and Affect: Mood normal.     ED Results / Procedures / Treatments   Labs (all labs ordered are listed, but only abnormal results are displayed) Labs Reviewed  COMPREHENSIVE METABOLIC PANEL -  Abnormal; Notable for the following components:      Result Value   Sodium 121 (*)    Chloride 91 (*)    CO2 18 (*)    Glucose, Bld 124 (*)    Total Bilirubin 1.8 (*)    GFR, Estimated 57 (*)    All other components within normal limits  SARS CORONAVIRUS 2 (TAT 6-24 HRS)  CBC WITH DIFFERENTIAL/PLATELET  SEDIMENTATION RATE  C-REACTIVE PROTEIN  URINALYSIS, ROUTINE W REFLEX MICROSCOPIC  SODIUM, URINE, RANDOM  CREATININE, URINE, RANDOM  TROPONIN I (HIGH SENSITIVITY)  TROPONIN I (HIGH SENSITIVITY)    EKG EKG Interpretation  Date/Time:  Saturday Jun 07 2020 10:55:43 EDT Ventricular Rate:  105 PR Interval:    QRS Duration: 110 QT Interval:  322 QTC Calculation: 426 R Axis:   -1 Text Interpretation: Atrial fibrillation Ventricular premature complex S1,S2,S3 pattern Consider RVH w/ secondary repol abnormality Anteroseptal infarct, old Repol abnrm suggests ischemia, diffuse leads Minimal ST elevation, lateral leads Confirmed by Madalyn Rob [phone]) on 06/07/2020 12:29:12 PM   Radiology CT Head Wo Contrast  Result Date: 06/07/2020 CLINICAL DATA:  87 year old presenting with a possible RIGHT facial droop, RIGHT facial numbness and slurred speech. Personal history of a prior TIA. EXAM: CT HEAD WITHOUT CONTRAST TECHNIQUE: Contiguous axial images were obtained from the base of the skull through the vertex without  intravenous contrast. COMPARISON:  04/27/2020 CT head and MRI brain. FINDINGS: Brain: Ventricular system normal in size and appearance for age. Mild moderate age-appropriate cortical atrophy. Mild to moderate changes of small vessel disease of the white matter diffusely. No mass lesion. No midline shift. No acute hemorrhage or hematoma. No extra-axial fluid collections. No evidence of acute infarction. Vascular: Severe BILATERAL carotid siphon and mild BILATERAL vertebral artery atherosclerosis. No hyperdense vessel. Skull: No skull fracture or other focal osseous abnormality involving the skull. Sinuses/Orbits: Opacification of scattered ethmoid air cells bilaterally, slightly increased since the prior CT. Remaining visualized paranasal sinuses, BILATERAL mastoid air cells and BILATERAL middle ear cavities well-aerated. Other: None. IMPRESSION: 1. No acute intracranial abnormality. 2. Mild to moderate age appropriate cortical atrophy and mild-to-moderate chronic microvascular ischemic changes of the white matter. 3. Mild BILATERAL ethmoid sinus disease. Electronically Signed   By: Evangeline Dakin M.D.   On: 06/07/2020 12:56   CT Temporal Bones Wo Contrast  Result Date: 06/07/2020 CLINICAL DATA:  Neoplasm, auditory canal (internal or external). Right ear infection, concern for malignant external otitis, concern for bony erosion/bony involvement. EXAM: CT TEMPORAL BONES WITHOUT CONTRAST TECHNIQUE: Axial and coronal plane CT imaging of the petrous temporal bones was performed with thin-collimation image reconstruction. No intravenous contrast was administered. Multiplanar CT image reconstructions were also generated. COMPARISON:  No pertinent prior exam. FINDINGS: RIGHT TEMPORAL BONE External auditory canal: Extensive partial opacification of the external auditory canal. Associated soft tissue thickening of the external auditory canal. No definite bony erosion of the external auditory canal is appreciated. Middle  ear cavity: Extensive partial opacification of the middle ear cavity. No definite ossicular erosion is appreciated. The scutum remain sharp. Inner ear structures: The cochlea, vestibule and semicircular canals are normal. The vestibular aqueduct is not enlarged. Internal auditory and facial nerve canals:  Normal Mastoid air cells: Moderate partial opacification of the mastoid air cells without appreciable coalescent osseous erosion. LEFT TEMPORAL BONE External auditory canal: Nonspecific mild partial opacification of the external auditory canal. Middle ear cavity: Normally aerated. The ossicles are unremarkable. The scutum is sharp. Inner ear structures: The cochlea, vestibule  and semicircular canals are normal. The vestibular aqueduct is not enlarged. Internal auditory and facial nerve canals:  Normal. Mastoid air cells: Small left mastoid effusion without appreciable coalescent osseous erosion. Limited intracranial:  Reported separately Visible orbits/paranasal sinuses: No acute or significant orbital finding. Small right maxillary sinus mucous retention cyst. IMPRESSION: On the right, there is extensive partial opacification of the right external auditory canal and associated soft tissue thickening along the external auditory canal. There is also extensive partial opacification of the middle ear cavity and a moderate right mastoid effusion. No appreciable osseous erosion. Findings are nonspecific and may reflect external otitis, otitis media and mastoiditis. Neoplasm is difficult to definitively exclude. Correlate with findings on otoscopic examination. On the left, there is mild nonspecific partial opacification of the external auditory canal. A small left mastoid effusion is also present. No appreciable osseous erosion. Correlate with findings on other otoscopic examination. Electronically Signed   By: Kellie Simmering DO   On: 06/07/2020 13:11    Procedures .Critical Care Performed by: Lucrezia Starch,  MD Authorized by: Lucrezia Starch, MD   Critical care provider statement:    Critical care time (minutes):  43   Critical care was time spent personally by me on the following activities:  Discussions with consultants, evaluation of patient's response to treatment, examination of patient, ordering and performing treatments and interventions, ordering and review of laboratory studies, ordering and review of radiographic studies, pulse oximetry, re-evaluation of patient's condition, obtaining history from patient or surrogate and review of old charts     Medications Ordered in ED Medications  metoprolol succinate (TOPROL-XL) 24 hr tablet 100 mg (100 mg Oral Given 06/07/20 1222)  cefdinir (OMNICEF) capsule 300 mg (has no administration in time range)  predniSONE (DELTASONE) tablet 40 mg (has no administration in time range)  valACYclovir (VALTREX) tablet 1,000 mg (has no administration in time range)  ondansetron (ZOFRAN) injection 4 mg (4 mg Intravenous Given 06/07/20 1114)  sodium chloride 0.9 % bolus 1,000 mL (0 mLs Intravenous Stopped 06/07/20 1223)    ED Course  I have reviewed the triage vital signs and the nursing notes.  Pertinent labs & imaging results that were available during my care of the patient were reviewed by me and considered in my medical decision making (see chart for details).    MDM Rules/Calculators/A&P                         87 year old lady presented to ER with chief complaint of right facial droop.  Unclear last known well.  On exam she was noted to have significant facial droop, forehead was involved.  No other neurologic involvement.  Suspect Bell's palsy.  On ear exam findings concerning for otitis media and externa.  CT scan negative for any acute bony involvement.  No focal tenderness over the mastoid process, lower suspicion for mastoiditis.  Patient noted to have nausea and occasional episode of vomiting.  Provided Zofran and fluids soon after arrival.  Her  basic labs were concerning for significant hyponatremia.  Due to the hyponatremia, believe patient would benefit from admission for further management.  Will consult medicine.  Reviewed case with neurology, Lorrin Goodell - he recommended steroids, valacyclovir for the suspected Bell's palsy.   Final Clinical Impression(s) / ED Diagnoses Final diagnoses:  Bell's palsy  Acute otitis media, unspecified otitis media type  Hyponatremia    Rx / DC Orders ED Discharge Orders    None  Lucrezia Starch, MD 06/07/20 [phone]

## 2020-06-07 NOTE — Consult Note (Signed)
Neurology Consultation Reason for Consult: Facial weakness Referring Physician: Nicoletta Dress, Na  CC: Facial weakness  History is obtained from: Patient, daughter  HPI: Amber Kennedy is a 87 y.o. female with a history of atrial fibrillation who presents with right facial weakness in the setting of right ear infection.  She started noticing problems with the right ear on Monday or Tuesday.  Then yesterday, she noticed that her right face was starting to become weak.  When she woke up this morning it was significantly worse.  She denies numbness, but has noticed being slightly dizzy when she walks.  This is not terribly unusual, however.  She denies vertigo, nausea, vomiting, diplopia or other symptoms.  ROS: A 14 point ROS was performed and is negative except as noted in the HPI.   Past Medical History:  Diagnosis Date  . A-fib ([place])   . Gout   . Hypercholesterolemia   . Hypertension   . Hypokalemia   . Hyponatremia   . Pacemaker St. Jude Assurity MRI dual chamber pacemaker 04/30/2020 04/30/2020  . Sick sinus syndrome ([place]) 05/09/2020  . Vision loss      Family History  Problem Relation Age of Onset  . Sudden death Father   . Sudden Cardiac Death Neg Hx      Social History:  reports that she quit smoking about 18 years ago. Her smoking use included cigarettes. She has a 17.50 pack-year smoking history. She has never used smokeless tobacco. She reports that she does not drink alcohol and does not use drugs.   Exam: Current vital signs: BP (!) 156/96 (BP Location: Right Arm)   Pulse 70   Temp 98.4 F (36.9 C) (Oral)   Resp 16   Ht 5' 7"  (1.702 m)   Wt 79.9 kg   SpO2 97%   BMI 27.59 kg/m  Vital signs in last 24 hours: Temp:  [98.4 F (36.9 C)-98.5 F (36.9 C)] 98.4 F (36.9 C) (05/14 1823) Pulse Rate:  [42-132] 70 (05/14 1823) Resp:  [12-20] 16 (05/14 1823) BP: (129-169)/(70-110) 156/96 (05/14 1823) SpO2:  [94 %-100 %] 97 % (05/14 1823) Weight:  [79.8 kg-79.9 kg] 79.9 kg  (05/14 1900)   Physical Exam  Constitutional: Appears well-developed and well-nourished.  Psych: Affect appropriate to situation Eyes: No scleral injection HENT: Her right ear appears to have some sort of exudate. She has some pustular appearing lesions on the right aspect of her soft palate MSK: no joint deformities.  Cardiovascular: Normal rate and regular rhythm.  Respiratory: Effort normal, non-labored breathing GI: Soft.  No distension. There is no tenderness.  Skin: WDI  Neuro: Mental Status: Patient is awake, alert, oriented to person, place, month, year, and situation. Patient is able to give a clear and coherent history.No signs of aphasia or neglect Cranial Nerves: II: Visual Fields are full. Pupils are equal, round, and reactive to light.   III,IV, VI: EOMI without ptosis or diploplia.  V: Facial sensation is symmetric to temperature VII: Facial movement consistent with peripheral facial palsy on the right VIII: hearing is intact to voice X: Uvula elevates symmetrically XI: Shoulder shrug is symmetric. XII: tongue is midline without atrophy or fasciculations.  Motor: Tone is normal. Bulk is normal. 5/5 strength was present in all four extremities.  Sensory: Sensation is symmetric to light touch and temperature in the arms and legs. Cerebellar: FNF intact bilaterally   I have reviewed labs in epic and the results pertinent to this consultation are: ESR 10  I have reviewed the images obtained: CT head- partial opacification of the right ear canal  Impression: 87 year old female with right-sided ear infection now developing facial palsy.  Facial palsy is a rare complication of otitis media/externa but certainly can happen.  The presence of the pustules/vesicles on the soft palate does make me wonder if she could be having a herpetic reactivation triggered in the setting of localized infection.  I am not certain if I would consider this idiopathic Bell's palsy given  the evidence of localized infection.  Typically for idiopathic Bell's palsy, I would do prednisone 60 mg x 7 days.  I would also do Valtrex 1 g 3 times daily x1 week given the severity of the weakness.  Recommendations: 1) agree with ENT evaluation,?  Whether lesions on the right soft palate could be herpetic in nature, if so would recommend Valtrex as above 2) defer decision on safety of steroids to ENT given localized infection\ 3) neurology will be available on an as-needed basis moving forward.   Roland Rack, MD [facility] [phone]  If 7pm- 7am, please page neurology on call as listed in [place].

## 2020-06-07 NOTE — Progress Notes (Signed)
Received pt from ED. Pt GCS 15, NIH score 1, speech clear. Pt denies ear pain, facial numbness decreased sensation or tingling. Pt son at bedside wanting pt to eat. This rn explained that there are no current orders for a diet. Informed pt/son md will be notified to address diet orders.

## 2020-06-07 NOTE — ED Notes (Signed)
Report received by previous [employer].

## 2020-06-07 NOTE — H&P (Signed)
History and Physical    Amber Kennedy:[id number] DOB: [birth year] DOA: 06/07/2020  PCP: [name], MD Patient coming from: home  Chief Complaint: facial droop, ear drainage and decreased appetite  HPI: Amber Kennedy is a 87 y.o. female with medical history significant of recent TIA on 04/27/2020, SSS s/p pacemaker placement on 04/30/2020, A. fib, chronic hyponatremia, HTN, HLD who presented with multiple complaints including facial droop, ear drainage and decreased appetite.   Patient lives by herself at home.  She reports that she has not been feeling well for 1 week.  Patient noted right ear canal discomfort with some drainage and decreased hearing for 1 week.  Associate symptoms included low-grade fever with T-max 99.88F, decreased appetite, decreased oral intake, generalized weakness, and fatigue. Patient put some hydrogen peroxide to her right ear canal to " wash it off" and some olive oil into her right ear canal, but did not seek medical attention.  She had some nausea and vomiting yesterday.  Around 8:00am this morning when she got up, she noticed right facial droop. She is not sure what was the last time she was normal since she "did not look at the mirror" yesterday.  Patient called EMS and was brought to ED for further evaluation.  In the emergency room, she was afebrile with pulse 120, RR 20, BP 161/90 and room air O2 sats 99%.  The labs showed sodium 121, glucose 124, negative troponin, unremarkable CBC.  EKG showed A. fib with RVR. Head CT showed No acute intracranial abnormality.  CT temporal bones showed suggested external otitis, otitis media and mastoiditis.  Neurology was consulted and ED.  Patient received cefdinir, prednisone 40 mg p.o. x1, Valtrex 1000 mg p.o  and normal saline bolus 1000 mL at ED.  Review of Systems: As per HPI otherwise 10 point review of systems negative.  Review of Systems Otherwise negative except as per HPI, including: General: Positive for fevers,  chills, decreased appetite, decreased oral intake, generalized weakness and fatigue.  Positive for right ear pain, drainage and decreased hearing. Resp: Denies cough, wheezing, shortness of breath. Cardiac: Denies chest pain, palpitations, orthopnea, paroxysmal nocturnal dyspnea. GI: Denies abdominal pain,diarrhea or constipation.  Positive for nausea and vomiting. GU: Denies dysuria, frequency, hesitancy or incontinence MS: Denies muscle aches, joint pain or swelling Neuro: Denies headache, neurologic deficits (focal weakness, numbness, tingling), abnormal gait.  Positive for right facial droop and unable to close right eye Psych: Denies anxiety, depression, SI/HI/AVH Skin: Denies new rashes or lesions ID: Denies sick contacts, exotic exposures, travel  Past Medical History:  Diagnosis Date  . A-fib ([place])   . Gout   . Hypercholesterolemia   . Hypertension   . Hypokalemia   . Hyponatremia   . Pacemaker St. Jude Assurity MRI dual chamber pacemaker 04/30/2020 04/30/2020  . Sick sinus syndrome ([place]) 05/09/2020  . Vision loss     Past Surgical History:  Procedure Laterality Date  . [place]  . PACEMAKER IMPLANT N/A 04/30/2020   Procedure: PACEMAKER IMPLANT;  Surgeon: Deboraha Sprang, MD;  Location: [facility];  Service: Cardiovascular;  Laterality: N/A;    SOCIAL HISTORY:  reports that she quit smoking about 18 years ago. Her smoking use included cigarettes. She has a 17.50 pack-year smoking history. She has never used smokeless tobacco. She reports that she does not drink alcohol and does not use drugs.  Allergies  Allergen Reactions  . Allopurinol Other (See Comments)    "Made  me feel badly, so I stopped taking it"  . Amlodipine Besylate Swelling and Other (See Comments)    Edema (legs)  . Penicillins Hives and Other (See Comments)    "The reaction happened a long time ago. I'm willing to take it now, if necessary."  . Albuterol Anxiety and Other (See  Comments)    Made the patient jittery  . Latex Rash and Other (See Comments)    Allergic to Band-Aids  . Penicillin G Hives and Other (See Comments)    "The reaction happened a long time ago. I'm willing to take it now, if necessary."    FAMILY HISTORY: Family History  Problem Relation Age of Onset  . Sudden death Father   . Sudden Cardiac Death Neg Hx      Prior to Admission medications   Medication Sig Start Date End Date Taking? Authorizing Provider  apixaban (ELIQUIS) 5 MG TABS tablet Take 1 tablet (5 mg total) by mouth 2 (two) times daily. 06/02/20  Yes Patwardhan, Manish J, MD  diltiazem (CARDIZEM CD) 180 MG 24 hr capsule Take 1 capsule (180 mg total) by mouth daily. 06/02/20  Yes Patwardhan, Reynold Bowen, MD  flecainide (TAMBOCOR) 50 MG tablet Take 1 tablet (50 mg total) by mouth every 12 (twelve) hours. 06/05/20  Yes Patwardhan, Manish J, MD  losartan (COZAAR) 50 MG tablet Take 1 tablet (50 mg total) by mouth daily. 06/02/20  Yes Patwardhan, Manish J, MD  metoprolol succinate (TOPROL-XL) 100 MG 24 hr tablet Take 1 tablet (100 mg total) by mouth daily. 06/02/20  Yes Patwardhan, Manish J, MD  Multiple Vitamins-Calcium (ONE-A-DAY WOMENS FORMULA) TABS Take 1 tablet by mouth daily with breakfast.   Yes [provider]  polyethylene glycol (MIRALAX / GLYCOLAX) 17 g packet Take 17 g by mouth daily as needed. 05/02/20  Yes Hosie Poisson, MD  rosuvastatin (CRESTOR) 20 MG tablet Take 1 tablet (20 mg total) by mouth daily. 06/02/20 06/02/21 Yes Patwardhan, Manish J, MD  senna-docusate (SENOKOT-S) 8.6-50 MG tablet Take 1 tablet by mouth at bedtime as needed for mild constipation. 05/01/20  Yes Hosie Poisson, MD  vitamin C (ASCORBIC ACID) 500 MG tablet Take 500 mg by mouth daily as needed (for supplementation).   Yes [provider]    Physical Exam: Vitals:   06/07/20 1645 06/07/20 1745 06/07/20 1823 06/07/20 1900  BP:  (!) 145/85 (!) 156/96   Pulse:  70 70   Resp: 18 13 16    Temp:    98.4 F (36.9 C)   TempSrc:   Oral   SpO2:  96% 97%   Weight:    79.9 kg  Height:          Constitutional: NAD, calm, comfortable.  Acute ill-appearing. Eyes: PERRL, lids and conjunctivae normal ENMT: Mucous membranes are moist. Posterior pharynx clear of any exudate or lesions.Normal dentition.  Right sided ear canal swelling with some mild tenderness to externa area.  Partial tympanic membrane was visualized and appears to be full. No perforation noted. Mastoid bone area without erythema, swelling, warm to touch or tenderness to palpation Neck: normal, supple, no masses, no thyromegaly Respiratory: clear to auscultation bilaterally, no wheezing, no crackles. Normal respiratory effort. No accessory muscle use.  Cardiovascular: Tachycardia.  Irregularly irregular. no murmurs / rubs / gallops. No extremity edema. 2+ pedal pulses. No carotid bruits.  Abdomen: no tenderness, no masses palpated. No hepatosplenomegaly. Bowel sounds positive.  Musculoskeletal: no clubbing / cyanosis. No joint deformity upper and lower extremities.  Good ROM, no contractures. Normal muscle tone.  Skin: no rashes, lesions, ulcers. No induration Neurologic: Eyebrow sagging, unable to completely close right eye, disappearance of nasal labial fold and right facial droop.  Sensation intact, DTR normal. Strength 5/5 in all 4.  Psychiatric: Normal judgment and insight. Alert and oriented x 3. Normal mood.     Labs on Admission: I have personally reviewed following labs and imaging studies  CBC: Recent Labs  Lab 06/07/20 1104  WBC 9.7  NEUTROABS 6.7  HGB 14.8  HCT 43.1  MCV 88.0  PLT [id number]   Basic Metabolic Panel: Recent Labs  Lab 06/07/20 1104  Marcine Gadway 121*  K 4.4  CL 91*  CO2 18*  GLUCOSE 124*  BUN 15  CREATININE 0.97  CALCIUM 9.1   GFR: Estimated Creatinine Clearance: 46.1 mL/min (by C-G formula based on SCr of 0.97 mg/dL). Liver Function Tests: Recent Labs  Lab 06/07/20 1104  AST 34  ALT 18   ALKPHOS 71  BILITOT 1.8*  PROT 7.4  ALBUMIN 3.9   No results for input(s): LIPASE, AMYLASE in the last 168 hours. No results for input(s): AMMONIA in the last 168 hours. Coagulation Profile: No results for input(s): INR, PROTIME in the last 168 hours. Cardiac Enzymes: No results for input(s): CKTOTAL, CKMB, CKMBINDEX, TROPONINI in the last 168 hours. BNP (last 3 results) No results for input(s): PROBNP in the last 8760 hours. HbA1C: No results for input(s): HGBA1C in the last 72 hours. CBG: No results for input(s): GLUCAP in the last 168 hours. Lipid Profile: No results for input(s): CHOL, HDL, LDLCALC, TRIG, CHOLHDL, LDLDIRECT in the last 72 hours. Thyroid Function Tests: No results for input(s): TSH, T4TOTAL, FREET4, T3FREE, THYROIDAB in the last 72 hours. Anemia Panel: No results for input(s): VITAMINB12, FOLATE, FERRITIN, TIBC, IRON, RETICCTPCT in the last 72 hours. Urine analysis:    Component Value Date/Time   COLORURINE YELLOW 04/28/2020 0625   APPEARANCEUR HAZY (A) 04/28/2020 0625   LABSPEC 1.011 04/28/2020 0625   PHURINE 5.0 04/28/2020 0625   GLUCOSEU NEGATIVE 04/28/2020 0625   HGBUR NEGATIVE 04/28/2020 0625   BILIRUBINUR NEGATIVE 04/28/2020 0625   KETONESUR NEGATIVE 04/28/2020 0625   PROTEINUR NEGATIVE 04/28/2020 0625   NITRITE NEGATIVE 04/28/2020 0625   LEUKOCYTESUR LARGE (A) 04/28/2020 0625   Sepsis Labs: !!!!!!!!!!!!!!!!!!!!!!!!!!!!!!!!!!!!!!!!!!!! @LABRCNTIP (procalcitonin:4,lacticidven:4) )No results found for this or any previous visit (from the past 240 hour(s)).   Radiological Exams on Admission: CT Head Wo Contrast  Result Date: 06/07/2020 CLINICAL DATA:  87 year old presenting with a possible RIGHT facial droop, RIGHT facial numbness and slurred speech. Personal history of a prior TIA. EXAM: CT HEAD WITHOUT CONTRAST TECHNIQUE: Contiguous axial images were obtained from the base of the skull through the vertex without intravenous contrast. COMPARISON:   04/27/2020 CT head and MRI brain. FINDINGS: Brain: Ventricular system normal in size and appearance for age. Mild moderate age-appropriate cortical atrophy. Mild to moderate changes of small vessel disease of the white matter diffusely. No mass lesion. No midline shift. No acute hemorrhage or hematoma. No extra-axial fluid collections. No evidence of acute infarction. Vascular: Severe BILATERAL carotid siphon and mild BILATERAL vertebral artery atherosclerosis. No hyperdense vessel. Skull: No skull fracture or other focal osseous abnormality involving the skull. Sinuses/Orbits: Opacification of scattered ethmoid air cells bilaterally, slightly increased since the prior CT. Remaining visualized paranasal sinuses, BILATERAL mastoid air cells and BILATERAL middle ear cavities well-aerated. Other: None. IMPRESSION: 1. No acute intracranial abnormality. 2. Mild to moderate age appropriate cortical atrophy  and mild-to-moderate chronic microvascular ischemic changes of the white matter. 3. Mild BILATERAL ethmoid sinus disease. Electronically Signed   By: Evangeline Dakin M.D.   On: 06/07/2020 12:56   CT Temporal Bones Wo Contrast  Result Date: 06/07/2020 CLINICAL DATA:  Neoplasm, auditory canal (internal or external). Right ear infection, concern for malignant external otitis, concern for bony erosion/bony involvement. EXAM: CT TEMPORAL BONES WITHOUT CONTRAST TECHNIQUE: Axial and coronal plane CT imaging of the petrous temporal bones was performed with thin-collimation image reconstruction. No intravenous contrast was administered. Multiplanar CT image reconstructions were also generated. COMPARISON:  No pertinent prior exam. FINDINGS: RIGHT TEMPORAL BONE External auditory canal: Extensive partial opacification of the external auditory canal. Associated soft tissue thickening of the external auditory canal. No definite bony erosion of the external auditory canal is appreciated. Middle ear cavity: Extensive partial  opacification of the middle ear cavity. No definite ossicular erosion is appreciated. The scutum remain sharp. Inner ear structures: The cochlea, vestibule and semicircular canals are normal. The vestibular aqueduct is not enlarged. Internal auditory and facial nerve canals:  Normal Mastoid air cells: Moderate partial opacification of the mastoid air cells without appreciable coalescent osseous erosion. LEFT TEMPORAL BONE External auditory canal: Nonspecific mild partial opacification of the external auditory canal. Middle ear cavity: Normally aerated. The ossicles are unremarkable. The scutum is sharp. Inner ear structures: The cochlea, vestibule and semicircular canals are normal. The vestibular aqueduct is not enlarged. Internal auditory and facial nerve canals:  Normal. Mastoid air cells: Small left mastoid effusion without appreciable coalescent osseous erosion. Limited intracranial:  Reported separately Visible orbits/paranasal sinuses: No acute or significant orbital finding. Small right maxillary sinus mucous retention cyst. IMPRESSION: On the right, there is extensive partial opacification of the right external auditory canal and associated soft tissue thickening along the external auditory canal. There is also extensive partial opacification of the middle ear cavity and a moderate right mastoid effusion. No appreciable osseous erosion. Findings are nonspecific and may reflect external otitis, otitis media and mastoiditis. Neoplasm is difficult to definitively exclude. Correlate with findings on otoscopic examination. On the left, there is mild nonspecific partial opacification of the external auditory canal. A small left mastoid effusion is also present. No appreciable osseous erosion. Correlate with findings on other otoscopic examination. Electronically Signed   By: Kellie Simmering DO   On: 06/07/2020 13:11     All images have been reviewed by me personally.  EKG: Independently reviewed.    Assessment/Plan Active Problems:   Atrial fibrillation with RVR (HCC)   Essential hypertension   Hyponatremia   Paroxysmal atrial fibrillation ([facility])   Sick sinus syndrome ([facility])   Pacemaker St. Jude Assurity MRI dual chamber pacemaker 04/30/2020   Otitis externa   Bell's palsy   Acute otitis media   Assessment Plan  # Otitis media and externa complicated with evidence of right-sided Bell's palsy  Clinical manifestation consistent with otitis media and externa with complication of right side Bell's palsy.  No clinical evidence of mastoiditis.   -Blood cultures are pending -Normal WBC -IV ceftriaxone daily -Neurology is consulted -ENT is consulted  #Hyponatremia  -Sodium 121 on admission - likely related to decreased oral intake for 1 week -Patient had received NS bolus 1000 mL per ED provider.  Will hold further IV fluids for now pending BMP every 6 hours.  The goal of sodium correction is between 4-6 meq over 24 hours   #A. fib with RVR #History of PAF -heart rate was 120  upon arrival to the ED, which resolved with IV fluids -EKG showed A. fib with RVR -Negative troponins -Continue to monitor -Continue home Cardizem, flecainide, Toprol-XL -Hold home Eliquis for now pending ENT evaluation and possible procedure tonight  # recent TIA on 4/3 -noted and stable  # Hx of SSS s/p PPM on 4/6 -noted and stable -Left upper chest wall incision well-healed   #HTN HLD -chronic and stable         Body mass index is 27.59 kg/m.        DVT prophylaxis:  Code Status:  Family Communication:  Consults called:  Admission status:   Status is: Observation  The patient remains OBS appropriate and will d/c before 2 midnights.  Dispo: The patient is from: Home              Anticipated d/c is to: Home              Patient currently is not medically stable to d/c.   Difficult to place patient No       Time Spent: 65 minutes.  >50% of the time was devoted to discussing  the patients care, assessment, plan and disposition with other care givers along with counseling the patient about the risks and benefits of treatment.    Amber Lange MD [facility]  If 7PM-7AM, please contact night-coverage   06/07/2020, 7:30 PM

## 2020-06-07 NOTE — Consult Note (Signed)
Reason for Consult: Right ear infection, facial paralysis Referring Physician: Hospitalist  Amber Kennedy is an 87 y.o. female.  HPI: 87 year old female with past history of right ear canal fungal infection managed with topical anti-fungal cream who developed right ear obstruction and hearing loss early this week.  She treated with peroxide and oil drops.  Last night, her right face became immobile.  She came to the ER and a temporal bone CT demonstrated right external and middle ear opacification.  She was admitted and started on ceftriaxone and prednisone.  Consultation was requested.  Past Medical History:  Diagnosis Date  . A-fib ([place])   . Gout   . Hypercholesterolemia   . Hypertension   . Hypokalemia   . Hyponatremia   . Pacemaker St. Jude Assurity MRI dual chamber pacemaker 04/30/2020 04/30/2020  . Sick sinus syndrome ([place]) 05/09/2020  . Vision loss     Past Surgical History:  Procedure Laterality Date  . [place]  . PACEMAKER IMPLANT N/A 04/30/2020   Procedure: PACEMAKER IMPLANT;  Surgeon: Deboraha Sprang, MD;  Location: [place];  Service: Cardiovascular;  Laterality: N/A;    Family History  Problem Relation Age of Onset  . Sudden death Father   . Sudden Cardiac Death Neg Hx     Social History:  reports that she quit smoking about 18 years ago. Her smoking use included cigarettes. She has a 17.50 pack-year smoking history. She has never used smokeless tobacco. She reports that she does not drink alcohol and does not use drugs.  Allergies:  Allergies  Allergen Reactions  . Allopurinol Other (See Comments)    "Made me feel badly, so I stopped taking it"  . Amlodipine Besylate Swelling and Other (See Comments)    Edema (legs)  . Penicillins Hives and Other (See Comments)    "The reaction happened a long time ago. I'm willing to take it now, if necessary."  . Albuterol Anxiety and Other (See Comments)    Made the patient jittery  . Latex Rash and  Other (See Comments)    Allergic to Band-Aids  . Penicillin G Hives and Other (See Comments)    "The reaction happened a long time ago. I'm willing to take it now, if necessary."    Medications: I have reviewed the patient's current medications.  Results for orders placed or performed during the hospital encounter of 06/07/20 (from the past 48 hour(s))  CBC with Differential     Status: None   Collection Time: 06/07/20 11:04 AM  Result Value Ref Range   WBC 9.7 4.0 - 10.5 K/uL   RBC 4.90 3.87 - 5.11 MIL/uL   Hemoglobin 14.8 12.0 - 15.0 g/dL   HCT 43.1 36.0 - 46.0 %   MCV 88.0 80.0 - 100.0 fL   MCH 30.2 26.0 - 34.0 pg   MCHC 34.3 30.0 - 36.0 g/dL   RDW 12.4 11.5 - 15.5 %   Platelets 184 150 - 400 K/uL   nRBC 0.0 0.0 - 0.2 %   Neutrophils Relative % 69 %   Neutro Abs 6.7 1.7 - 7.7 K/uL   Lymphocytes Relative 22 %   Lymphs Abs 2.2 0.7 - 4.0 K/uL   Monocytes Relative 8 %   Monocytes Absolute 0.8 0.1 - 1.0 K/uL   Eosinophils Relative 0 %   Eosinophils Absolute 0.0 0.0 - 0.5 K/uL   Basophils Relative 0 %   Basophils Absolute 0.0 0.0 - 0.1 K/uL  Immature Granulocytes 1 %   Abs Immature Granulocytes 0.05 0.00 - 0.07 K/uL    Comment: Performed at [facility], [address] [place], [place] [address]  Comprehensive metabolic panel     Status: Abnormal   Collection Time: 06/07/20 11:04 AM  Result Value Ref Range   Sodium 121 (L) 135 - 145 mmol/L   Potassium 4.4 3.5 - 5.1 mmol/L   Chloride 91 (L) 98 - 111 mmol/L   CO2 18 (L) 22 - 32 mmol/L   Glucose, Bld 124 (H) 70 - 99 mg/dL    Comment: Glucose reference range applies only to samples taken after fasting for at least 8 hours.   BUN 15 8 - 23 mg/dL   Creatinine, Ser 6.040.97 0.44 - 1.00 mg/dL   Calcium 9.1 8.9 - 54.010.3 mg/dL   Total Protein 7.4 6.5 - 8.1 g/dL   Albumin 3.9 3.5 - 5.0 g/dL   AST 34 15 - 41 U/L   ALT 18 0 - 44 U/L   Alkaline Phosphatase 71 38 - 126 U/L   Total Bilirubin 1.8 (H) 0.3 - 1.2 mg/dL   GFR,  Estimated 57 (L) >60 mL/min    Comment: (NOTE) Calculated using the CKD-EPI Creatinine Equation (2021)    Anion gap 12 5 - 15    Comment: Performed at [facility], [address] [place], [place] [address]  Troponin I (High Sensitivity)     Status: None   Collection Time: 06/07/20 11:04 AM  Result Value Ref Range   Troponin I (High Sensitivity) 7 <18 ng/L    Comment: (NOTE) Elevated high sensitivity troponin I (hsTnI) values and significant  changes across serial measurements may suggest ACS but many other  chronic and acute conditions are known to elevate hsTnI results.  Refer to the "Links" section for chest pain algorithms and additional  guidance. Performed at [facility], [address] [place], [place] [address]   Sedimentation rate     Status: None   Collection Time: 06/07/20 11:15 AM  Result Value Ref Range   Sed Rate 10 0 - 22 mm/hr    Comment: Performed at [facility], [address] [place], [place] [address]  Urinalysis, Routine w reflex microscopic Urine, Clean Catch     Status: None   Collection Time: 06/07/20  1:06 PM  Result Value Ref Range   Color, Urine YELLOW YELLOW   APPearance CLEAR CLEAR   Specific Gravity, Urine 1.008 1.005 - 1.030   pH 6.0 5.0 - 8.0   Glucose, UA NEGATIVE NEGATIVE mg/dL   Hgb urine dipstick NEGATIVE NEGATIVE   Bilirubin Urine NEGATIVE NEGATIVE   Ketones, ur NEGATIVE NEGATIVE mg/dL   Protein, ur NEGATIVE NEGATIVE mg/dL   Nitrite NEGATIVE NEGATIVE   Leukocytes,Ua NEGATIVE NEGATIVE    Comment: Performed at [facility], [address] [place], [place] [address]  Sodium, urine, random     Status: None   Collection Time: 06/07/20  1:06 PM  Result Value Ref Range   Sodium, Ur 29 mmol/L    Comment: Performed at [facility], [address] [place], [place] [address]  Creatinine, urine, random     Status: None   Collection Time: 06/07/20  1:06 PM  Result Value Ref Range   Creatinine, Urine 46.24  mg/dL    Comment: Performed at [facility], [address] [place], [place] [address]  Troponin I (High Sensitivity)  Status: None   Collection Time: 06/07/20  1:30 PM  Result Value Ref Range   Troponin I (High Sensitivity) <2 <18 ng/L    Comment: (NOTE) Elevated high sensitivity troponin I (hsTnI) values and significant  changes across serial measurements may suggest ACS but many other  chronic and acute conditions are known to elevate hsTnI results.  Refer to the "Links" section for chest pain algorithms and additional  guidance. Performed at [facility], [place] [address] [place], [place] [address]   SARS CORONAVIRUS 2 (TAT 6-24 HRS) Nasopharyngeal Nasopharyngeal Swab     Status: None   Collection Time: 06/07/20  2:05 PM   Specimen: Nasopharyngeal Swab  Result Value Ref Range   SARS Coronavirus 2 NEGATIVE NEGATIVE    Comment: (NOTE) SARS-CoV-2 target nucleic acids are NOT DETECTED.  The SARS-CoV-2 RNA is generally detectable in upper and lower respiratory specimens during the acute phase of infection. Negative results do not preclude SARS-CoV-2 infection, do not rule out co-infections with other pathogens, and should not be used as the sole basis for treatment or other patient management decisions. Negative results must be combined with clinical observations, patient history, and epidemiological information. The expected result is Negative.  Fact Sheet for Patients: SugarRoll.be  Fact Sheet for Healthcare Providers: https://www.woods-mathews.com/  This test is not yet approved or cleared by the [place] FDA and  has been authorized for detection and/or diagnosis of SARS-CoV-2 by FDA under an Emergency Use Authorization (EUA). This EUA will remain  in effect (meaning this test can be used) for the duration of the COVID-19 declaration under Se ction 564(b)(1) of the Act, 21 U.S.C. section 360bbb-3(b)(1), unless  the authorization is terminated or revoked sooner.  Performed at [facility], [place] [address], [place], [place] [address]     CT Head Wo Contrast  Result Date: 06/07/2020 CLINICAL DATA:  87 year old presenting with a possible RIGHT facial droop, RIGHT facial numbness and slurred speech. Personal history of a prior TIA. EXAM: CT HEAD WITHOUT CONTRAST TECHNIQUE: Contiguous axial images were obtained from the base of the skull through the vertex without intravenous contrast. COMPARISON:  04/27/2020 CT head and MRI brain. FINDINGS: Brain: Ventricular system normal in size and appearance for age. Mild moderate age-appropriate cortical atrophy. Mild to moderate changes of small vessel disease of the white matter diffusely. No mass lesion. No midline shift. No acute hemorrhage or hematoma. No extra-axial fluid collections. No evidence of acute infarction. Vascular: Severe BILATERAL carotid siphon and mild BILATERAL vertebral artery atherosclerosis. No hyperdense vessel. Skull: No skull fracture or other focal osseous abnormality involving the skull. Sinuses/Orbits: Opacification of scattered ethmoid air cells bilaterally, slightly increased since the prior CT. Remaining visualized paranasal sinuses, BILATERAL mastoid air cells and BILATERAL middle ear cavities well-aerated. Other: None. IMPRESSION: 1. No acute intracranial abnormality. 2. Mild to moderate age appropriate cortical atrophy and mild-to-moderate chronic microvascular ischemic changes of the white matter. 3. Mild BILATERAL ethmoid sinus disease. Electronically Signed   By: Evangeline Dakin M.D.   On: 06/07/2020 12:56   CT Temporal Bones Wo Contrast  Result Date: 06/07/2020 CLINICAL DATA:  Neoplasm, auditory canal (internal or external). Right ear infection, concern for malignant external otitis, concern for bony erosion/bony involvement. EXAM: CT TEMPORAL BONES WITHOUT CONTRAST TECHNIQUE: Axial and coronal plane CT imaging of the petrous  temporal bones was performed with thin-collimation image reconstruction. No intravenous contrast was administered. Multiplanar CT image reconstructions were also generated. COMPARISON:  No pertinent prior exam.  FINDINGS: RIGHT TEMPORAL BONE External auditory canal: Extensive partial opacification of the external auditory canal. Associated soft tissue thickening of the external auditory canal. No definite bony erosion of the external auditory canal is appreciated. Middle ear cavity: Extensive partial opacification of the middle ear cavity. No definite ossicular erosion is appreciated. The scutum remain sharp. Inner ear structures: The cochlea, vestibule and semicircular canals are normal. The vestibular aqueduct is not enlarged. Internal auditory and facial nerve canals:  Normal Mastoid air cells: Moderate partial opacification of the mastoid air cells without appreciable coalescent osseous erosion. LEFT TEMPORAL BONE External auditory canal: Nonspecific mild partial opacification of the external auditory canal. Middle ear cavity: Normally aerated. The ossicles are unremarkable. The scutum is sharp. Inner ear structures: The cochlea, vestibule and semicircular canals are normal. The vestibular aqueduct is not enlarged. Internal auditory and facial nerve canals:  Normal. Mastoid air cells: Small left mastoid effusion without appreciable coalescent osseous erosion. Limited intracranial:  Reported separately Visible orbits/paranasal sinuses: No acute or significant orbital finding. Small right maxillary sinus mucous retention cyst. IMPRESSION: On the right, there is extensive partial opacification of the right external auditory canal and associated soft tissue thickening along the external auditory canal. There is also extensive partial opacification of the middle ear cavity and a moderate right mastoid effusion. No appreciable osseous erosion. Findings are nonspecific and may reflect external otitis, otitis media and  mastoiditis. Neoplasm is difficult to definitively exclude. Correlate with findings on otoscopic examination. On the left, there is mild nonspecific partial opacification of the external auditory canal. A small left mastoid effusion is also present. No appreciable osseous erosion. Correlate with findings on other otoscopic examination. Electronically Signed   By: Kellie Simmering DO   On: 06/07/2020 13:11    Review of Systems  HENT: Positive for hearing loss.   Neurological: Positive for facial asymmetry.  All other systems reviewed and are negative.  Blood pressure (!) 156/96, pulse 70, temperature 98.4 F (36.9 C), temperature source Oral, resp. rate 16, height 5\' 7"  (1.702 m), weight 79.9 kg, SpO2 97 %. Physical Exam Constitutional:      Appearance: Normal appearance. She is normal weight.  HENT:     Head: Normocephalic and atraumatic.     Ears:     Comments: Left external ear normal, ear canal with cerumen obstructing view of tympanic membrane.  Right external ear with ulcerated conchal bowl, ear canal edematous, unable to visualize tympanic membrane.    Nose: Nose normal.     Mouth/Throat:     Mouth: Mucous membranes are moist.     Pharynx: Oropharynx is clear.  Eyes:     Extraocular Movements: Extraocular movements intact.     Conjunctiva/sclera: Conjunctivae normal.     Pupils: Pupils are equal, round, and reactive to light.  Cardiovascular:     Rate and Rhythm: Normal rate.  Pulmonary:     Effort: Pulmonary effort is normal.  Musculoskeletal:     Cervical back: Normal range of motion.  Skin:    General: Skin is warm and dry.  Neurological:     Mental Status: She is alert and oriented to person, place, and time. Mental status is at baseline.     Cranial Nerves: Cranial nerve deficit (Right facial paralysis, unable to close right eye, no resting tone.) present.  Psychiatric:        Mood and Affect: Mood normal.        Behavior: Behavior normal.  Thought Content: Thought  content normal.        Judgment: Judgment normal.     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  I personally reviewed her temporal bone CT.  The mastoid involvement is minimal.  She has evidence of middle ear and external canal involvement.  The ulcerated conchal bowl could be consistent with zoster oticus.  I agree with covering bacterial involvement with ceftriaxone.  I placed an otowick and will start Ciprodex drops.  Will remove the wick in 3-5 days.  I recommend covering potential viral involvement with valacyclovir or equivalent.  Continue systemic steroids.  Right eye care is critical.  Will add artificial tear drops to the already ordered lube.  She can probably be discharged when medically stable on outpatient management.  Follow-up with me 5/17.  Melida Quitter 06/07/2020, 9:27 PM

## 2020-06-07 NOTE — ED Notes (Signed)
Report called to floor

## 2020-06-07 NOTE — Progress Notes (Signed)
Spoke to Dr Nicoletta Dress by phone and received update. Md states will hold on IVFs and follow labs for sodium level, will keep pt npo until ENT eval this evening and bedside swallow study done by nurse. Pt son and daughter at bedside, updates given to pt by phone.

## 2020-06-08 ENCOUNTER — Encounter (HOSPITAL_COMMUNITY): Payer: Self-pay | Admitting: Internal Medicine

## 2020-06-08 DIAGNOSIS — I10 Essential (primary) hypertension: Secondary | ICD-10-CM | POA: Diagnosis present

## 2020-06-08 DIAGNOSIS — Z950 Presence of cardiac pacemaker: Secondary | ICD-10-CM | POA: Diagnosis not present

## 2020-06-08 DIAGNOSIS — R197 Diarrhea, unspecified: Secondary | ICD-10-CM | POA: Diagnosis present

## 2020-06-08 DIAGNOSIS — E878 Other disorders of electrolyte and fluid balance, not elsewhere classified: Secondary | ICD-10-CM | POA: Diagnosis present

## 2020-06-08 DIAGNOSIS — H6690 Otitis media, unspecified, unspecified ear: Secondary | ICD-10-CM | POA: Diagnosis not present

## 2020-06-08 DIAGNOSIS — G510 Bell's palsy: Secondary | ICD-10-CM | POA: Diagnosis present

## 2020-06-08 DIAGNOSIS — Z87891 Personal history of nicotine dependence: Secondary | ICD-10-CM | POA: Diagnosis not present

## 2020-06-08 DIAGNOSIS — Z7901 Long term (current) use of anticoagulants: Secondary | ICD-10-CM | POA: Diagnosis not present

## 2020-06-08 DIAGNOSIS — H66001 Acute suppurative otitis media without spontaneous rupture of ear drum, right ear: Secondary | ICD-10-CM | POA: Diagnosis not present

## 2020-06-08 DIAGNOSIS — E785 Hyperlipidemia, unspecified: Secondary | ICD-10-CM | POA: Diagnosis present

## 2020-06-08 DIAGNOSIS — I4891 Unspecified atrial fibrillation: Secondary | ICD-10-CM | POA: Diagnosis not present

## 2020-06-08 DIAGNOSIS — E7800 Pure hypercholesterolemia, unspecified: Secondary | ICD-10-CM | POA: Diagnosis present

## 2020-06-08 DIAGNOSIS — H6091 Unspecified otitis externa, right ear: Secondary | ICD-10-CM | POA: Diagnosis present

## 2020-06-08 DIAGNOSIS — Z91040 Latex allergy status: Secondary | ICD-10-CM | POA: Diagnosis not present

## 2020-06-08 DIAGNOSIS — Z888 Allergy status to other drugs, medicaments and biological substances status: Secondary | ICD-10-CM | POA: Diagnosis not present

## 2020-06-08 DIAGNOSIS — Z8673 Personal history of transient ischemic attack (TIA), and cerebral infarction without residual deficits: Secondary | ICD-10-CM | POA: Diagnosis not present

## 2020-06-08 DIAGNOSIS — H6691 Otitis media, unspecified, right ear: Secondary | ICD-10-CM | POA: Diagnosis present

## 2020-06-08 DIAGNOSIS — Z880 Allergy status to penicillin: Secondary | ICD-10-CM | POA: Diagnosis not present

## 2020-06-08 DIAGNOSIS — E871 Hypo-osmolality and hyponatremia: Secondary | ICD-10-CM | POA: Diagnosis present

## 2020-06-08 DIAGNOSIS — Z20822 Contact with and (suspected) exposure to covid-19: Secondary | ICD-10-CM | POA: Diagnosis present

## 2020-06-08 DIAGNOSIS — Z79899 Other long term (current) drug therapy: Secondary | ICD-10-CM | POA: Diagnosis not present

## 2020-06-08 DIAGNOSIS — I495 Sick sinus syndrome: Secondary | ICD-10-CM | POA: Diagnosis present

## 2020-06-08 DIAGNOSIS — E860 Dehydration: Secondary | ICD-10-CM | POA: Diagnosis present

## 2020-06-08 DIAGNOSIS — M109 Gout, unspecified: Secondary | ICD-10-CM | POA: Diagnosis present

## 2020-06-08 DIAGNOSIS — I480 Paroxysmal atrial fibrillation: Secondary | ICD-10-CM | POA: Diagnosis present

## 2020-06-08 LAB — BASIC METABOLIC PANEL
Anion gap: 11 (ref 5–15)
Anion gap: 9 (ref 5–15)
Anion gap: 9 (ref 5–15)
Anion gap: 9 (ref 5–15)
BUN: 17 mg/dL (ref 8–23)
BUN: 18 mg/dL (ref 8–23)
BUN: 22 mg/dL (ref 8–23)
BUN: 23 mg/dL (ref 8–23)
CO2: 19 mmol/L — ABNORMAL LOW (ref 22–32)
CO2: 22 mmol/L (ref 22–32)
CO2: 22 mmol/L (ref 22–32)
CO2: 20 mmol/L — ABNORMAL LOW (ref 22–32)
Calcium: 8.7 mg/dL — ABNORMAL LOW (ref 8.9–10.3)
Calcium: 9.1 mg/dL (ref 8.9–10.3)
Calcium: 9.1 mg/dL (ref 8.9–10.3)
Calcium: 9 mg/dL (ref 8.9–10.3)
Chloride: 92 mmol/L — ABNORMAL LOW (ref 98–111)
Chloride: 93 mmol/L — ABNORMAL LOW (ref 98–111)
Chloride: 96 mmol/L — ABNORMAL LOW (ref 98–111)
Chloride: 96 mmol/L — ABNORMAL LOW (ref 98–111)
Creatinine, Ser: 1 mg/dL (ref 0.44–1.00)
Creatinine, Ser: 1.06 mg/dL — ABNORMAL HIGH (ref 0.44–1.00)
Creatinine, Ser: 0.97 mg/dL (ref 0.44–1.00)
Creatinine, Ser: 1.22 mg/dL — ABNORMAL HIGH (ref 0.44–1.00)
GFR, Estimated: 55 mL/min — ABNORMAL LOW (ref >60)
GFR, Estimated: 51 mL/min — ABNORMAL LOW (ref >60)
GFR, Estimated: 57 mL/min — ABNORMAL LOW (ref >60)
GFR, Estimated: 43 mL/min — ABNORMAL LOW (ref >60)
Glucose, Bld: 206 mg/dL — ABNORMAL HIGH (ref 70–99)
Glucose, Bld: 140 mg/dL — ABNORMAL HIGH (ref 70–99)
Glucose, Bld: 113 mg/dL — ABNORMAL HIGH (ref 70–99)
Glucose, Bld: 194 mg/dL — ABNORMAL HIGH (ref 70–99)
Potassium: 3.7 mmol/L (ref 3.5–5.1)
Potassium: 4 mmol/L (ref 3.5–5.1)
Potassium: 3.8 mmol/L (ref 3.5–5.1)
Potassium: 4.3 mmol/L (ref 3.5–5.1)
Sodium: 122 mmol/L — ABNORMAL LOW (ref 135–145)
Sodium: 124 mmol/L — ABNORMAL LOW (ref 135–145)
Sodium: 127 mmol/L — ABNORMAL LOW (ref 135–145)
Sodium: 125 mmol/L — ABNORMAL LOW (ref 135–145)

## 2020-06-08 LAB — CBC WITH DIFFERENTIAL/PLATELET
Abs Immature Granulocytes: 0.03 10*3/uL (ref 0.00–0.07)
Basophils Absolute: 0 10*3/uL (ref 0.0–0.1)
Basophils Relative: 0 %
Eosinophils Absolute: 0 10*3/uL (ref 0.0–0.5)
Eosinophils Relative: 0 %
HCT: 38.2 % (ref 36.0–46.0)
Hemoglobin: 13.2 g/dL (ref 12.0–15.0)
Immature Granulocytes: 0 %
Lymphocytes Relative: 15 %
Lymphs Abs: 1.2 10*3/uL (ref 0.7–4.0)
MCH: 30.3 pg (ref 26.0–34.0)
MCHC: 34.6 g/dL (ref 30.0–36.0)
MCV: 87.6 fL (ref 80.0–100.0)
Monocytes Absolute: 0.4 10*3/uL (ref 0.1–1.0)
Monocytes Relative: 5 %
Neutro Abs: 6 10*3/uL (ref 1.7–7.7)
Neutrophils Relative %: 80 %
Platelets: 170 10*3/uL (ref 150–400)
RBC: 4.36 MIL/uL (ref 3.87–5.11)
RDW: 12.3 % (ref 11.5–15.5)
WBC: 7.6 10*3/uL (ref 4.0–10.5)
nRBC: 0 % (ref 0.0–0.2)

## 2020-06-08 MED ORDER — APIXABAN 5 MG PO TABS
5.0000 mg | ORAL_TABLET | Freq: Two times a day (BID) | ORAL | Status: DC
  Administered 2020-06-08 – 2020-06-14 (×13): 5 mg via ORAL
  Filled 2020-06-08 (×13): qty 1

## 2020-06-08 MED ORDER — SODIUM CHLORIDE 0.9 % IV SOLN: INTRAVENOUS | Status: DC

## 2020-06-08 MED ORDER — PREDNISONE 20 MG PO TABS
60.0000 mg | ORAL_TABLET | Freq: Every day | ORAL | Status: DC
  Administered 2020-06-08 – 2020-06-14 (×7): 60 mg via ORAL
  Filled 2020-06-08 (×7): qty 3

## 2020-06-08 NOTE — Evaluation (Signed)
Clinical/Bedside Swallow Evaluation Patient Details  Name: Amber Kennedy MRN: [id number] Date of Birth: [birth year]  Today's Date: 06/08/2020 Time: SLP Start Time (ACUTE ONLY): 1127 SLP Stop Time (ACUTE ONLY): 1143 SLP Time Calculation (min) (ACUTE ONLY): 16 min  Past Medical History:  Past Medical History:  Diagnosis Date  . A-fib ([place])   . Gout   . Hypercholesterolemia   . Hypertension   . Hypokalemia   . Hyponatremia   . Pacemaker St. Jude Assurity MRI dual chamber pacemaker 04/30/2020 04/30/2020  . Sick sinus syndrome ([place]) 05/09/2020  . Vision loss    Past Surgical History:  Past Surgical History:  Procedure Laterality Date  . [place]  . PACEMAKER IMPLANT N/A 04/30/2020   Procedure: PACEMAKER IMPLANT;  Surgeon: Deboraha Sprang, MD;  Location: [place] [facility];  Service: Cardiovascular;  Laterality: N/A;   HPI:  87 year old female with history of recent TIA in April 2022, sick sinus syndrome status post pacemaker April 2022, chronic A. fib, chronic hyponatremia, hypertension, hyperlipidemia came into the hospital with severe drainage, decreased appetite, fatigue as well as facial droop. Dx with otitis media and externa complicated by right sided Bell's palsy, recent TIA, hyponatremia.   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 Despite significant right sided facial weakness , patient compensating well independently by placing bolus on unaffected left side, using left side as primary site for mastication, and taking small sips of liquid via cup. Intermittent anterior labial spillage of liquids only which was eliminated wtih SLP cueing to attempt use of straw to increased labial seal. Patient without overt s/s of aspiration and with full oral clearance. Visual impairments due impact self feeding in that patient is unable to see exactly how big her bolus is before placing in mouth  although daughter present and assisting to cut food into small pieces and cue patient for smaller bites after SLP instruction. Per daughter, patient has lots of family support to assist her with meals both in and when discharged from the hospital. Discussed solid consistency options including downgrade to mechanical soft however patient prefers to stay on regular and choose soft items independently. SLP in agreement. All education complete. No other SLP needs indicated at this time. SLP Visit Diagnosis: Dysphagia, oral phase (R13.11)    Aspiration Risk  Mild aspiration risk    Diet Recommendation Regular;Thin liquid   Liquid Administration via: Straw Medication Administration: Whole meds with liquid Supervision: Patient able to self feed;Staff to assist with self feeding Compensations: Slow rate;Small sips/bites;Lingual sweep for clearance of pocketing Postural Changes: Seated upright at 90 degrees    Other  Recommendations Oral Care Recommendations: Oral care BID   Follow up Recommendations None        Swallow Study   General HPI: 87 year old female with history of recent TIA in April 2022, sick sinus syndrome status post pacemaker April 2022, chronic A. fib, chronic hyponatremia, hypertension, hyperlipidemia came into the hospital with severe drainage, decreased appetite, fatigue as well as facial droop. Dx with otitis media and externa complicated by right sided Bell's palsy, recent TIA, hyponatremia. Type of Study: Bedside Swallow Evaluation Previous Swallow Assessment: none Diet Prior to this Study: Regular;Thin liquids Temperature Spikes Noted: No Respiratory Status: Room air History of Recent Intubation: No Behavior/Cognition: Alert;Cooperative;Pleasant mood Oral Cavity Assessment: Within Functional Limits Oral Care Completed by SLP: No Oral Cavity - Dentition: Adequate natural dentition Vision: Functional for self-feeding (  can self feed but difficulty seeing exact amounts on  utentils) Self-Feeding Abilities: Able to feed self Patient Positioning: Upright in chair Baseline Vocal Quality: Normal Volitional Cough: Strong Volitional Swallow: Able to elicit    Oral/Motor/Sensory Function Overall Oral Motor/Sensory Function: Severe impairment Facial ROM: Reduced right;Suspected CN VII (facial) dysfunction Facial Symmetry: Abnormal symmetry right;Suspected CN VII (facial) dysfunction Facial Strength: Reduced right;Suspected CN VII (facial) dysfunction Facial Sensation: Within Functional Limits Lingual ROM: Within Functional Limits Lingual Symmetry: Within Functional Limits Lingual Strength: Within Functional Limits Lingual Sensation: Within Functional Limits Velum: Within Functional Limits Mandible: Within Functional Limits   Ice Chips Ice chips: Not tested   Thin Liquid Thin Liquid: Impaired Presentation: Cup;Self Fed;Straw Oral Phase Impairments: Reduced labial seal Oral Phase Functional Implications: Right anterior spillage;Left anterior spillage    Nectar Thick Nectar Thick Liquid: Not tested   Honey Thick Honey Thick Liquid: Not tested   Puree Puree: Within functional limits Presentation: Spoon;Self Fed   Solid     Solid: Within functional limits Presentation: Self Fed;Spoon     Jericca Russett MA, CCC-SLP   Ikaika Showers Meryl 06/08/2020,12:55 PM

## 2020-06-08 NOTE — Progress Notes (Signed)
PROGRESS NOTE  Amber Kennedy HKV:[id number] DOB: 06-22-1933 DOA: 06/07/2020 PCP: [name], MD   LOS: 0 days   Brief Narrative / Interim history: 87 year old female with history of recent TIA in April 2022, sick sinus syndrome status post pacemaker April 2022, chronic A. fib, chronic hyponatremia, hypertension, hyperlipidemia came into the hospital with severe drainage, decreased appetite, fatigue as well as facial droop.  Subjective / 24h Interval events: She is doing well this morning, denies any ear pain, denies any eye pain.  No nausea or vomiting, no chest pain, no shortness of breath.  She is eating well.  Assessment & Plan: Principal Problem Otitis media and externa complicated by right-sided Bell's palsy -clinical manifestation consistent with otitis media and externa.  ENT as well as neurology consulted in the ED, appreciate input.  Given concern for bacterial vs viral infection, she was placed on antibiotics with ceftriaxone as well as Valtrex with plans for 7-day course.  She was also placed on steroids given Bell's palsy, and ENT placed an otowick.  She is also on Ciprodex drops as well as eyedrops.  Active Problems Acute on chronic hyponatremia-patient has had few episodes of hyponatremia in the past usually in the setting of dehydration, most recent sodium values were within normal limits last month.  She is likely hyponatremic now due to very poor p.o. intake as well as watery diarrhea for couple of days prior to admission.  Sodium on admission was 122, slightly improved this morning to 124 but still low.  She is clinically dehydrated, hypochloremic, placed back on IV fluids this morning and continue to monitor sodium throughout the day.  Chronic A. fib, RVR in the ED-continue diltiazem, metoprolol, flecainide.  Her anticoagulation has been on hold due to concern for procedures but it does not appear that this will be the case.  Resume Eliquis -Rate much better controlled  this morning  Essential hypertension-continue home regimen as below  Hyperlipidemia-continue statin  Sick sinus syndrome status post pacemaker-stable  Recent TIA -on Eliquis as above, continue statin  Scheduled Meds: . artificial tears  1 application Right Eye TID  . ciprofloxacin-dexamethasone  4 drop Right EAR BID  . diltiazem  180 mg Oral Daily  . flecainide  50 mg Oral Q12H  . losartan  50 mg Oral Daily  . metoprolol succinate  100 mg Oral Daily  . predniSONE  60 mg Oral Q breakfast  . rosuvastatin  20 mg Oral Daily  . valACYclovir  1,000 mg Oral TID   Continuous Infusions: . sodium chloride 50 mL/hr at 06/08/20 0330  . sodium chloride    . cefTRIAXone (ROCEPHIN)  IV 2 g (06/07/20 2015)   PRN Meds:.acetaminophen **OR** acetaminophen, artificial tears, ondansetron **OR** ondansetron (ZOFRAN) IV, polyethylene glycol, polyvinyl alcohol  Diet Orders (From admission, onward)    Start     Ordered   06/08/20 0838  Diet regular Room service appropriate? Yes; Fluid consistency: Thin  Diet effective now       Question Answer Comment  Room service appropriate? Yes   Fluid consistency: Thin      06/08/20 0837          DVT prophylaxis: SCDs Start: 06/07/20 1930     Code Status: Full Code  Family Communication: Daughter present at bedside  Status is: Observation  The patient will require care spanning > 2 midnights and should be moved to inpatient because: Persistent severe electrolyte disturbances  Dispo: The patient is from: Home  Anticipated d/c is to: Home              Patient currently is not medically stable to d/c.   Difficult to place patient No  Level of care: Telemetry Medical  Consultants:  ENT, neurology  Procedures:  None  Microbiology  Blood cultures 5/14-pending  Antimicrobials: Ceftriaxone 5/14 >> Valtrex 5/14 >>   Objective: Vitals:   06/07/20 1823 06/07/20 1900 06/08/20 0039 06/08/20 0504  BP: (!) 156/96  131/75 132/79   Pulse: 70  66 72  Resp: 16  18 18   Temp: 98.4 F (36.9 C)  97.8 F (36.6 C) 98 F (36.7 C)  TempSrc: Oral  Oral Oral  SpO2: 97%  99% 95%  Weight:  79.9 kg    Height:        Intake/Output Summary (Last 24 hours) at 06/08/2020 0924 Last data filed at 06/08/2020 0700 Gross per 24 hour  Intake 1460 ml  Output --  Net 1460 ml   Filed Weights   06/07/20 1117 06/07/20 1900  Weight: 79.8 kg 79.9 kg    Examination:  Constitutional: NAD Eyes: no scleral icterus ENMT: Mucous membranes are moist.  Neck: normal, supple Respiratory: clear to auscultation bilaterally, no wheezing, no crackles. Normal respiratory effort.  Cardiovascular: Irregular, no appreciable murmurs.  No peripheral edema Abdomen: non distended, no tenderness. Bowel sounds positive.  Musculoskeletal: no clubbing / cyanosis.  Skin: no rashes Neurologic: Bell's palsy present.  No other focal deficits Psychiatric: Normal judgment and insight. Alert and oriented x 3. Normal mood.    Data Reviewed: I have independently reviewed following labs and imaging studies   CBC: Recent Labs  Lab 06/07/20 1104 06/08/20 0308  WBC 9.7 7.6  NEUTROABS 6.7 6.0  HGB 14.8 13.2  HCT 43.1 38.2  MCV 88.0 87.6  PLT 184 938   Basic Metabolic Panel: Recent Labs  Lab 06/07/20 1104 06/07/20 2139 06/08/20 0027 06/08/20 0308  NA 121* 125* 122* 124*  K 4.4 4.2 3.7 4.0  CL 91* 96* 92* 93*  CO2 18* 19* 19* 22  GLUCOSE 124* 137* 206* 140*  BUN 15 15 17 18   CREATININE 0.97 0.90 1.00 1.06*  CALCIUM 9.1 8.9 8.7* 9.1   Liver Function Tests: Recent Labs  Lab 06/07/20 1104  AST 34  ALT 18  ALKPHOS 71  BILITOT 1.8*  PROT 7.4  ALBUMIN 3.9   Coagulation Profile: Recent Labs  Lab 06/07/20 2139  INR 1.1   HbA1C: No results for input(s): HGBA1C in the last 72 hours. CBG: No results for input(s): GLUCAP in the last 168 hours.  Recent Results (from the past 240 hour(s))  SARS CORONAVIRUS 2 (TAT 6-24 HRS) Nasopharyngeal  Nasopharyngeal Swab     Status: None   Collection Time: 06/07/20  2:05 PM   Specimen: Nasopharyngeal Swab  Result Value Ref Range Status   SARS Coronavirus 2 NEGATIVE NEGATIVE Final    Comment: (NOTE) SARS-CoV-2 target nucleic acids are NOT DETECTED.  The SARS-CoV-2 RNA is generally detectable in upper and lower respiratory specimens during the acute phase of infection. Negative results do not preclude SARS-CoV-2 infection, do not rule out co-infections with other pathogens, and should not be used as the sole basis for treatment or other patient management decisions. Negative results must be combined with clinical observations, patient history, and epidemiological information. The expected result is Negative.  Fact Sheet for Patients: SugarRoll.be  Fact Sheet for Healthcare Providers: https://www.woods-mathews.com/  This test is not yet approved or cleared by  the [employer] and  has been authorized for detection and/or diagnosis of SARS-CoV-2 by FDA under an Emergency Use Authorization (EUA). This EUA will remain  in effect (meaning this test can be used) for the duration of the COVID-19 declaration under Se ction 564(b)(1) of the Act, 21 U.S.C. section 360bbb-3(b)(1), unless the authorization is terminated or revoked sooner.  Performed at [facility], [place] [address] [place], [place] [address]      Radiology Studies: CT Head Wo Contrast  Result Date: 06/07/2020 CLINICAL DATA:  87 year old presenting with a possible RIGHT facial droop, RIGHT facial numbness and slurred speech. Personal history of a prior TIA. EXAM: CT HEAD WITHOUT CONTRAST TECHNIQUE: Contiguous axial images were obtained from the base of the skull through the vertex without intravenous contrast. COMPARISON:  04/27/2020 CT head and MRI brain. FINDINGS: Brain: Ventricular system normal in size and appearance for age. Mild moderate age-appropriate cortical  atrophy. Mild to moderate changes of small vessel disease of the white matter diffusely. No mass lesion. No midline shift. No acute hemorrhage or hematoma. No extra-axial fluid collections. No evidence of acute infarction. Vascular: Severe BILATERAL carotid siphon and mild BILATERAL vertebral artery atherosclerosis. No hyperdense vessel. Skull: No skull fracture or other focal osseous abnormality involving the skull. Sinuses/Orbits: Opacification of scattered ethmoid air cells bilaterally, slightly increased since the prior CT. Remaining visualized paranasal sinuses, BILATERAL mastoid air cells and BILATERAL middle ear cavities well-aerated. Other: None. IMPRESSION: 1. No acute intracranial abnormality. 2. Mild to moderate age appropriate cortical atrophy and mild-to-moderate chronic microvascular ischemic changes of the white matter. 3. Mild BILATERAL ethmoid sinus disease. Electronically Signed   By: Evangeline Dakin M.D.   On: 06/07/2020 12:56   CT Temporal Bones Wo Contrast  Result Date: 06/07/2020 CLINICAL DATA:  Neoplasm, auditory canal (internal or external). Right ear infection, concern for malignant external otitis, concern for bony erosion/bony involvement. EXAM: CT TEMPORAL BONES WITHOUT CONTRAST TECHNIQUE: Axial and coronal plane CT imaging of the petrous temporal bones was performed with thin-collimation image reconstruction. No intravenous contrast was administered. Multiplanar CT image reconstructions were also generated. COMPARISON:  No pertinent prior exam. FINDINGS: RIGHT TEMPORAL BONE External auditory canal: Extensive partial opacification of the external auditory canal. Associated soft tissue thickening of the external auditory canal. No definite bony erosion of the external auditory canal is appreciated. Middle ear cavity: Extensive partial opacification of the middle ear cavity. No definite ossicular erosion is appreciated. The scutum remain sharp. Inner ear structures: The cochlea,  vestibule and semicircular canals are normal. The vestibular aqueduct is not enlarged. Internal auditory and facial nerve canals:  Normal Mastoid air cells: Moderate partial opacification of the mastoid air cells without appreciable coalescent osseous erosion. LEFT TEMPORAL BONE External auditory canal: Nonspecific mild partial opacification of the external auditory canal. Middle ear cavity: Normally aerated. The ossicles are unremarkable. The scutum is sharp. Inner ear structures: The cochlea, vestibule and semicircular canals are normal. The vestibular aqueduct is not enlarged. Internal auditory and facial nerve canals:  Normal. Mastoid air cells: Small left mastoid effusion without appreciable coalescent osseous erosion. Limited intracranial:  Reported separately Visible orbits/paranasal sinuses: No acute or significant orbital finding. Small right maxillary sinus mucous retention cyst. IMPRESSION: On the right, there is extensive partial opacification of the right external auditory canal and associated soft tissue thickening along the external auditory canal. There is also extensive partial opacification of the middle ear cavity and a moderate right mastoid effusion. No appreciable osseous erosion.  Findings are nonspecific and may reflect external otitis, otitis media and mastoiditis. Neoplasm is difficult to definitively exclude. Correlate with findings on otoscopic examination. On the left, there is mild nonspecific partial opacification of the external auditory canal. A small left mastoid effusion is also present. No appreciable osseous erosion. Correlate with findings on other otoscopic examination. Electronically Signed   By: Kellie Simmering DO   On: 06/07/2020 13:11    Amber Board, MD, PhD [facility]  Between 7 am - 7 pm I am available, please contact me via Amion (for emergencies) or Securechat (non urgent messages)  Between 7 pm - 7 am I am not available, please contact night coverage  MD/APP via Amion

## 2020-06-08 NOTE — Progress Notes (Signed)
Patient able to close rt eye completely when asleep so RN did not put gauze or tape it.

## 2020-06-08 NOTE — Evaluation (Signed)
Physical Therapy Evaluation Patient Details Name: Amber Kennedy MRN: [id number] DOB: Dec 24, 1933 Today's Date: 06/08/2020   History of Present Illness  87 y.o. female who presented 06/07/20 with multiple complaints including facial droop, ear drainage and decreased appetite. Head CT showed No acute intracranial abnormality.  CT temporal bones showed suggested external otitis, otitis media and mastoiditis. Rt sided Bell's Palsy, hyponatremia, afib with RVR  PMH significant of recent TIA on 04/27/2020, SSS s/p pacemaker placement on 04/30/2020, A. fib, chronic hyponatremia, HTN, HLD  Clinical Impression   Pt admitted secondary to problem above with deficits below. PTA patient was living independently, including carrying her laundry up/down 14 steps between levels of her home.  Pt currently requires min assist with RW to ambulate 30 ft. Patient is very unsteady without UE support (likely due to ear infection) and much improved when using RW. She does however require further education in safe use of DME to maximize her safety. Will continue to follow acutely to maximize functional mobility independence and safety.       Follow Up Recommendations CIR    Equipment Recommendations  None recommended by PT    Recommendations for Other Services OT consult;Rehab consult     Precautions / Restrictions Precautions Precautions: Fall      Mobility  Bed Mobility               General bed mobility comments: up in recliner    Transfers Overall transfer level: Needs assistance Equipment used: Rolling walker (2 wheeled) Transfers: Sit to/from Stand Sit to Stand: Min guard         General transfer comment: vc for hand placement with use of RW sit <>stand  Ambulation/Gait Ambulation/Gait assistance: Min assist Gait Distance (Feet): 10 Feet (toileted; 30) Assistive device: Rolling walker (2 wheeled) Gait Pattern/deviations: Step-through pattern;Decreased stride length;Shuffle;Trunk flexed      General Gait Details: tends to push RW too far ahead; lists to her left reporting she is having more difficulty due to decr vision  Stairs            Wheelchair Mobility    Modified Rankin (Stroke Patients Only)       Balance Overall balance assessment: Needs assistance Sitting-balance support: No upper extremity supported;Feet supported Sitting balance-Leahy Scale: Fair     Standing balance support: No upper extremity supported Standing balance-Leahy Scale: Poor Standing balance comment: incr ant-post sway without UE support                             Pertinent Vitals/Pain Pain Assessment: No/denies pain    Home Living Family/patient expects to be discharged to:: Private residence Living Arrangements: Alone Available Help at Discharge: Family;Available PRN/intermittently Type of Home: House Home Access: Stairs to enter Entrance Stairs-Rails: None Entrance Stairs-Number of Steps: 2+1 Home Layout: Multi-level (split level-laundry downstairs; living area up 7 steps) Home Equipment: Walker - 2 wheels;Cane - single point;Bedside commode;Shower seat Additional Comments: has chair in shower for shampoo, chair next to tub with phone for safety    Prior Function Level of Independence: Independent         Comments: after recent TIA returned to independent     Hand Dominance   Dominant Hand: Right    Extremity/Trunk Assessment   Upper Extremity Assessment Upper Extremity Assessment: Defer to OT evaluation    Lower Extremity Assessment Lower Extremity Assessment: Generalized weakness    Cervical / Trunk Assessment Cervical / Trunk Assessment:  Normal  Communication   Communication: No difficulties  Cognition Arousal/Alertness: Awake/alert Behavior During Therapy: WFL for tasks assessed/performed Overall Cognitive Status: Within Functional Limits for tasks assessed                                 General Comments: pt is  aware she is more unsteady and not yet able to walk by herself      General Comments General comments (skin integrity, edema, etc.): Daughter present at end of session. Feels they can provide 24/7 care if pt needs    Exercises     Assessment/Plan    PT Assessment Patient needs continued PT services  PT Problem List Decreased strength;Decreased activity tolerance;Decreased balance;Decreased mobility;Decreased knowledge of use of DME       PT Treatment Interventions      PT Goals (Current goals can be found in the Care Plan section)  Acute Rehab PT Goals Patient Stated Goal: to feel more sure on my feet PT Goal Formulation: With patient Time For Goal Achievement: 06/22/20 Potential to Achieve Goals: Good    Frequency Min 3X/week   Barriers to discharge Decreased caregiver support      Co-evaluation               AM-PAC PT "6 Clicks" Mobility  Outcome Measure Help needed turning from your back to your side while in a flat bed without using bedrails?: None Help needed moving from lying on your back to sitting on the side of a flat bed without using bedrails?: A Little Help needed moving to and from a bed to a chair (including a wheelchair)?: A Little Help needed standing up from a chair using your arms (e.g., wheelchair or bedside chair)?: A Little Help needed to walk in hospital room?: A Little Help needed climbing 3-5 steps with a railing? : A Little 6 Click Score: 19    End of Session Equipment Utilized During Treatment: Gait belt Activity Tolerance: Patient limited by fatigue Patient left: in chair;with call bell/phone within reach;with family/visitor present (pt up on arrival without chair alarm; returned to same; pt educated and knows to call for assist) Nurse Communication: Mobility status PT Visit Diagnosis: Unsteadiness on feet (R26.81);Difficulty in walking, not elsewhere classified (R26.2)    Time: 6314-9702 PT Time Calculation (min) (ACUTE ONLY): 29  min   Charges:   PT Evaluation $PT Eval Moderate Complexity: 1 Mod PT Treatments $Self Care/Home Management: 8-22         Arby Barrette, PT Pager [phone]   Rexanne Mano 06/08/2020, 4:47 PM

## 2020-06-08 NOTE — Progress Notes (Signed)
ANTICOAGULATION CONSULT NOTE - Initial Consult  Pharmacy Consult for apixaban Indication: atrial fibrillation  Allergies  Allergen Reactions  . Allopurinol Other (See Comments)    "Made me feel badly, so I stopped taking it"  . Amlodipine Besylate Swelling and Other (See Comments)    Edema (legs)  . Penicillins Hives and Other (See Comments)    "The reaction happened a long time ago. I'm willing to take it now, if necessary."  . Albuterol Anxiety and Other (See Comments)    Made the patient jittery  . Latex Rash and Other (See Comments)    Allergic to Band-Aids  . Penicillin G Hives and Other (See Comments)    "The reaction happened a long time ago. I'm willing to take it now, if necessary."    Patient Measurements: Height: 5\' 7"  (170.2 cm) Weight: 79.9 kg (176 lb 2.4 oz) IBW/kg (Calculated) : 61.6    Vital Signs: Temp: 98 F (36.7 C) (05/15 0504) Temp Source: Oral (05/15 0504) BP: 132/79 (05/15 0504) Pulse Rate: 72 (05/15 0504)  Labs: Recent Labs    06/07/20 1104 06/07/20 1330 06/07/20 2139 06/08/20 0027 06/08/20 0308  HGB 14.8  --   --   --  13.2  HCT 43.1  --   --   --  38.2  PLT 184  --   --   --  170  LABPROT  --   --  14.1  --   --   INR  --   --  1.1  --   --   CREATININE 0.97  --  0.90 1.00 1.06*  TROPONINIHS 7 <2  --   --   --     Estimated Creatinine Clearance: 42.2 mL/min (A) (by C-G formula based on SCr of 1.06 mg/dL (H)).   Medical History: Past Medical History:  Diagnosis Date  . A-fib ([place])   . Gout   . Hypercholesterolemia   . Hypertension   . Hypokalemia   . Hyponatremia   . Pacemaker [employer] Jude Assurity MRI dual chamber pacemaker 04/30/2020 04/30/2020  . Sick sinus syndrome ([place]) 05/09/2020  . Vision loss     Assessment: 87 yo W on apixaban 5mg  BID PTA for afib. Apixaban was held for possible procedure. Pharmacy consulted to restart apixaban.  Home dose appropriate for age, weight, renal function. H/H, plt stable.   Goal of  Therapy:  Monitor platelets by anticoagulation protocol: Yes   Plan:  Restart apixaban 5mg  BID  Monitor for signs/symptoms of bleeding   Benetta Spar, PharmD, BCPS, BCCP Clinical Pharmacist  Please check AMION for all [place] phone numbers After 10:00 PM, call [place] [phone]

## 2020-06-09 ENCOUNTER — Encounter (HOSPITAL_COMMUNITY): Payer: Self-pay | Admitting: Internal Medicine

## 2020-06-09 LAB — BASIC METABOLIC PANEL
Anion gap: 8 (ref 5–15)
Anion gap: 7 (ref 5–15)
BUN: 21 mg/dL (ref 8–23)
BUN: 24 mg/dL — ABNORMAL HIGH (ref 8–23)
CO2: 21 mmol/L — ABNORMAL LOW (ref 22–32)
CO2: 22 mmol/L (ref 22–32)
Calcium: 9.3 mg/dL (ref 8.9–10.3)
Calcium: 9 mg/dL (ref 8.9–10.3)
Chloride: 101 mmol/L (ref 98–111)
Chloride: 102 mmol/L (ref 98–111)
Creatinine, Ser: 1.07 mg/dL — ABNORMAL HIGH (ref 0.44–1.00)
Creatinine, Ser: 1.19 mg/dL — ABNORMAL HIGH (ref 0.44–1.00)
GFR, Estimated: 51 mL/min — ABNORMAL LOW (ref >60)
GFR, Estimated: 45 mL/min — ABNORMAL LOW (ref >60)
Glucose, Bld: 129 mg/dL — ABNORMAL HIGH (ref 70–99)
Glucose, Bld: 182 mg/dL — ABNORMAL HIGH (ref 70–99)
Potassium: 4.1 mmol/L (ref 3.5–5.1)
Potassium: 4.5 mmol/L (ref 3.5–5.1)
Sodium: 130 mmol/L — ABNORMAL LOW (ref 135–145)
Sodium: 131 mmol/L — ABNORMAL LOW (ref 135–145)

## 2020-06-09 NOTE — Progress Notes (Signed)
Inpatient Rehabilitation Admissions Coordinator  Inpatient rehab consult received. I met with patient , a daughter and a granddaughter, Benjamine Mola . We discussed goals and expectations of a possible CIR admit. They prefer CIR rather than SNF. I will begin insurance Auth today once I have updated PT and OT progress from today.    Danne Baxter, RN, MSN Rehab Admissions Coordinator [phone] 06/09/2020 12:27 PM

## 2020-06-09 NOTE — Progress Notes (Signed)
Physical Therapy Treatment Patient Details Name: Amber Kennedy MRN: [id number] DOB: August 31, 1933 Today's Date: 06/09/2020    History of Present Illness 87 y.o. female who presented 06/07/20 with multiple complaints including facial droop, ear drainage and decreased appetite. Head CT showed No acute intracranial abnormality.  CT temporal bones showed suggested external otitis, otitis media and mastoiditis. Rt sided Bell's Palsy, hyponatremia, afib with RVR  PMH significant of recent TIA on 04/27/2020, SSS s/p pacemaker placement on 04/30/2020, A. fib, chronic hyponatremia, HTN, HLD    PT Comments    Continuing work on functional mobility and activity tolerance;  Session focused on progressive ambulation; noted initial erratic step length and decr heel strike; multimodal cueing for Full hip extension in stance, and to take slow, smooth steps; a few instances of loss of balance, requiring up to mod assist to correct; better use and management of RW with turns today;   Overall progressing well; Anticipate continuing good progress at post-acute rehabilitation.   Follow Up Recommendations  CIR     Equipment Recommendations  None recommended by PT    Recommendations for Other Services OT consult;Rehab consult     Precautions / Restrictions Precautions Precautions: Fall Precaution Comments: Very fearful of falling Restrictions Weight Bearing Restrictions: No    Mobility  Bed Mobility               General bed mobility comments: up in recliner    Transfers Overall transfer level: Needs assistance Equipment used: Rolling walker (2 wheeled) Transfers: Sit to/from Stand Sit to Stand: Min assist         General transfer comment: minA for safety and stability;1x loss of balance with initial stand; worked on serial sit<>stands; good carryover of education for hand placement  Ambulation/Gait Ambulation/Gait assistance: Min assist;Mod Web designer (Feet): 40 Feet Assistive  device: Rolling walker (2 wheeled) Gait Pattern/deviations: Step-through pattern;Decreased stride length;Shuffle;Trunk flexed;Decreased dorsiflexion - right;Decreased dorsiflexion - left Gait velocity: very slow   General Gait Details: Good carryover of RW use and proximity education compared to last session's notes; Very dependent on UE support from RW; cues to incr heel strike and stand tall as she weight shifted onto stance LE (both R and L) for incr satbiltiy   Stairs             Wheelchair Mobility    Modified Rankin (Stroke Patients Only)       Balance Overall balance assessment: Needs assistance Sitting-balance support: No upper extremity supported;Feet supported Sitting balance-Leahy Scale: Fair     Standing balance support: No upper extremity supported Standing balance-Leahy Scale: Poor Standing balance comment: incr ant-post sway without UE support                            Cognition Arousal/Alertness: Awake/alert Behavior During Therapy: WFL for tasks assessed/performed Overall Cognitive Status: Within Functional Limits for tasks assessed                                 General Comments: pt is aware of safety, current deficits and increased need for support      Exercises      General Comments General comments (skin integrity, edema, etc.): Daughter and granddaughter present during session; They indicated pt was extremely independent prior t thsi incident, and was able to get up and down off of teh floor when playing with great grandkids  Pertinent Vitals/Pain Pain Assessment: No/denies pain    Home Living Family/patient expects to be discharged to:: Private residence Living Arrangements: Alone Available Help at Discharge: Family;Available PRN/intermittently (family aware of need for increased supports at home) Type of Home: House Home Access: Stairs to enter Entrance Stairs-Rails: None Home Layout: Multi-level (split  level-laundry downstairs; living area up 7 steps) Home Equipment: Walker - 2 wheels;Cane - single point;Bedside commode;Shower seat Additional Comments: has chair in shower for shampoo, chair next to tub with phone for safety    Prior Function Level of Independence: Independent      Comments: after recent TIA returned to independent   PT Goals (current goals can now be found in the care plan section) Acute Rehab PT Goals Patient Stated Goal: to return to prior level of independence; be able to play with great grandkids (often getting down on teh floor and up again) PT Goal Formulation: With patient Time For Goal Achievement: 06/22/20 Potential to Achieve Goals: Good Progress towards PT goals: Progressing toward goals    Frequency    Min 3X/week      PT Plan Current plan remains appropriate    Co-evaluation              AM-PAC PT "6 Clicks" Mobility   Outcome Measure  Help needed turning from your back to your side while in a flat bed without using bedrails?: None Help needed moving from lying on your back to sitting on the side of a flat bed without using bedrails?: A Little Help needed moving to and from a bed to a chair (including a wheelchair)?: A Little Help needed standing up from a chair using your arms (e.g., wheelchair or bedside chair)?: A Little Help needed to walk in hospital room?: A Little Help needed climbing 3-5 steps with a railing? : A Little 6 Click Score: 19    End of Session Equipment Utilized During Treatment: Gait belt Activity Tolerance: Patient tolerated treatment well Patient left: in chair;with call bell/phone within reach;with family/visitor present (pt up on arrival without chair alarm; returned to same; pt educated and knows to call for assist) Nurse Communication: Mobility status PT Visit Diagnosis: Unsteadiness on feet (R26.81);Difficulty in walking, not elsewhere classified (R26.2)     Time: 8527-7824 PT Time Calculation (min)  (ACUTE ONLY): 32 min  Charges:  $Gait Training: 8-22 mins                     Roney [place], [place]  Acute Rehabilitation Services Pager [phone] Office [phone]    [name] 06/09/2020, 1:24 PM

## 2020-06-09 NOTE — Progress Notes (Signed)
PROGRESS NOTE  Amber Kennedy JKK:[id number] DOB: [birth year] DOA: 06/07/2020 PCP: [name], MD   LOS: 1 day   Brief Narrative / Interim history: 87 year old female with history of recent TIA in April 2022, sick sinus syndrome status post pacemaker April 2022, chronic A. fib, chronic hyponatremia, hypertension, hyperlipidemia came into the hospital with severe drainage, decreased appetite, fatigue as well as facial droop.  Subjective / 24h Interval events: Feels extremely weak, lightheaded and dizzy.  No chest pain, no shortness of breath  Assessment & Plan: Principal Problem Otitis media and externa complicated by right-sided Bell's palsy -clinical manifestation consistent with otitis media and externa.  ENT as well as neurology consulted in the ED, appreciate input.  Given concern for bacterial vs viral infection, she was placed on antibiotics with ceftriaxone as well as Valtrex with plans for 7-day course.  She was also placed on steroids given Bell's palsy, and ENT placed an otowick.  She is also on Ciprodex drops as well as eyedrops.  Active Problems Acute on chronic hyponatremia-patient has had few episodes of hyponatremia in the past usually in the setting of dehydration, most recent sodium values were within normal limits last month.  She is likely hyponatremic now due to very poor p.o. intake as well as watery diarrhea for couple of days prior to admission.  Sodium on admission was 122, improving with fluids, 130 this morning.  Continue fluids and recheck sodium this afternoon, stop IV fluids if greater than 130  Chronic A. fib, RVR in the ED-continue diltiazem, metoprolol, flecainide.  Her anticoagulation has been on hold due to concern for procedures but it does not appear that this will be the case.  Resume Eliquis -Rate controlled this morning  Essential hypertension-continue home regimen as below  Hyperlipidemia-continue statin  Sick sinus syndrome status post  pacemaker-stable  Recent TIA -on Eliquis as above, continue statin  Weakness-with some dizziness possibly in the setting of ear involvement.  PT recommends CIR, awaiting evaluation  Scheduled Meds: . apixaban  5 mg Oral BID  . artificial tears  1 application Right Eye TID  . ciprofloxacin-dexamethasone  4 drop Right EAR BID  . diltiazem  180 mg Oral Daily  . flecainide  50 mg Oral Q12H  . losartan  50 mg Oral Daily  . metoprolol succinate  100 mg Oral Daily  . predniSONE  60 mg Oral Q breakfast  . rosuvastatin  20 mg Oral Daily  . valACYclovir  1,000 mg Oral TID   Continuous Infusions: . sodium chloride 50 mL/hr at 06/08/20 2201  . cefTRIAXone (ROCEPHIN)  IV 2 g (06/08/20 2019)   PRN Meds:.acetaminophen **OR** acetaminophen, artificial tears, ondansetron **OR** ondansetron (ZOFRAN) IV, polyethylene glycol, polyvinyl alcohol  Diet Orders (From admission, onward)    Start     Ordered   06/08/20 0838  Diet regular Room service appropriate? Yes; Fluid consistency: Thin  Diet effective now       Question Answer Comment  Room service appropriate? Yes   Fluid consistency: Thin      06/08/20 0837          DVT prophylaxis: SCDs Start: 06/07/20 1930 apixaban (ELIQUIS) tablet 5 mg     Code Status: Full Code  Family Communication: Daughter present at bedside  Status is: Inpatient  : Persistent severe electrolyte disturbances  Dispo: The patient is from: Home              Anticipated d/c is to: CIR  Patient currently is not medically stable to d/c.   Difficult to place patient No  Level of care: Telemetry Medical  Consultants:  ENT, neurology  Procedures:  None  Microbiology  Blood cultures 5/14-pending  Antimicrobials: Ceftriaxone 5/14 >> Valtrex 5/14 >>   Objective: Vitals:   06/08/20 1232 06/08/20 1737 06/09/20 0011 06/09/20 0506  BP: 116/70 (!) 128/57 124/76 133/77  Pulse: 69 89 76 85  Resp: 17 19 18 18   Temp: 98.3 F (36.8 C) 98.2 F  (36.8 C) 97.9 F (36.6 C) 97.8 F (36.6 C)  TempSrc: Oral Oral Oral Oral  SpO2: 98% 97% 96% 97%  Weight:      Height:        Intake/Output Summary (Last 24 hours) at 06/09/2020 1033 Last data filed at 06/08/2020 1700 Gross per 24 hour  Intake 300 ml  Output --  Net 300 ml   Filed Weights   06/07/20 1117 06/07/20 1900  Weight: 79.8 kg 79.9 kg    Examination:  Constitutional: She is in no distress Eyes: No icterus ENMT: mmm Neck: normal, supple Respiratory: Clear bilaterally, no wheezing, normal respiratory effort Cardiovascular: Irregular, no edema Abdomen: Nondistended, bowel sounds positive Musculoskeletal: no clubbing / cyanosis.  Skin: No rashes seen Neurologic: Bell's palsy present.  No other focal deficits  Data Reviewed: I have independently reviewed following labs and imaging studies   CBC: Recent Labs  Lab 06/07/20 1104 06/08/20 0308  WBC 9.7 7.6  NEUTROABS 6.7 6.0  HGB 14.8 13.2  HCT 43.1 38.2  MCV 88.0 87.6  PLT 184 938   Basic Metabolic Panel: Recent Labs  Lab 06/08/20 0027 06/08/20 0308 06/08/20 1230 06/08/20 1831 06/09/20 0356  NA 122* 124* 127* 125* 130*  K 3.7 4.0 3.8 4.3 4.1  CL 92* 93* 96* 96* 101  CO2 19* 22 22 20* 21*  GLUCOSE 206* 140* 113* 194* 129*  BUN 17 18 22 23 21   CREATININE 1.00 1.06* 0.97 1.22* 1.07*  CALCIUM 8.7* 9.1 9.1 9.0 9.3   Liver Function Tests: Recent Labs  Lab 06/07/20 1104  AST 34  ALT 18  ALKPHOS 71  BILITOT 1.8*  PROT 7.4  ALBUMIN 3.9   Coagulation Profile: Recent Labs  Lab 06/07/20 2139  INR 1.1   HbA1C: No results for input(s): HGBA1C in the last 72 hours. CBG: No results for input(s): GLUCAP in the last 168 hours.  Recent Results (from the past 240 hour(s))  SARS CORONAVIRUS 2 (TAT 6-24 HRS) Nasopharyngeal Nasopharyngeal Swab     Status: None   Collection Time: 06/07/20  2:05 PM   Specimen: Nasopharyngeal Swab  Result Value Ref Range Status   SARS Coronavirus 2 NEGATIVE NEGATIVE  Final    Comment: (NOTE) SARS-CoV-2 target nucleic acids are NOT DETECTED.  The SARS-CoV-2 RNA is generally detectable in upper and lower respiratory specimens during the acute phase of infection. Negative results do not preclude SARS-CoV-2 infection, do not rule out co-infections with other pathogens, and should not be used as the sole basis for treatment or other patient management decisions. Negative results must be combined with clinical observations, patient history, and epidemiological information. The expected result is Negative.  Fact Sheet for Patients: SugarRoll.be  Fact Sheet for Healthcare Providers: https://www.woods-mathews.com/  This test is not yet approved or cleared by the [place] FDA and  has been authorized for detection and/or diagnosis of SARS-CoV-2 by FDA under an Emergency Use Authorization (EUA). This EUA will remain  in effect (meaning this test can  be used) for the duration of the COVID-19 declaration under Se ction 564(b)(1) of the Act, 21 U.S.C. section 360bbb-3(b)(1), unless the authorization is terminated or revoked sooner.  Performed at [facility], [place] [address] [place], [place] [address]   Culture, blood (Routine X 2) w Reflex to ID Panel     Status: None (Preliminary result)   Collection Time: 06/07/20  9:39 PM   Specimen: BLOOD RIGHT ARM  Result Value Ref Range Status   Specimen Description BLOOD RIGHT ARM  Final   Special Requests   Final    BOTTLES DRAWN AEROBIC ONLY Blood Culture results may not be optimal due to an inadequate volume of blood received in culture bottles   Culture   Final    NO GROWTH < 12 HOURS Performed at [facility], [place] [address] [place], [place] [address]    Report Status PENDING  Incomplete  Culture, blood (Routine X 2) w Reflex to ID Panel     Status: None (Preliminary result)   Collection Time: 06/07/20  9:48 PM   Specimen: BLOOD LEFT HAND   Result Value Ref Range Status   Specimen Description BLOOD LEFT HAND  Final   Special Requests   Final    BOTTLES DRAWN AEROBIC ONLY Blood Culture results may not be optimal due to an inadequate volume of blood received in culture bottles   Culture   Final    NO GROWTH < 12 HOURS Performed at [facility], [place] [address], [place], [place] [address]    Report Status PENDING  Incomplete     Radiology Studies: No results found.  Marzetta Board, MD, PhD [facility]  Between 7 am - 7 pm I am available, please contact me via Amion (for emergencies) or Securechat (non urgent messages)  Between 7 pm - 7 am I am not available, please contact night coverage MD/APP via Amion

## 2020-06-09 NOTE — Evaluation (Signed)
Occupational Therapy Evaluation Patient Details Name: Amber Kennedy MRN: [id number] DOB: 1933/08/28 Today's Date: 06/09/2020    History of Present Illness 87 y.o. female who presented 06/07/20 with multiple complaints including facial droop, ear drainage and decreased appetite. Head CT showed No acute intracranial abnormality.  CT temporal bones showed suggested external otitis, otitis media and mastoiditis. Rt sided Bell's Palsy, hyponatremia, afib with RVR  PMH significant of recent TIA on 04/27/2020, SSS s/p pacemaker placement on 04/30/2020, A. fib, chronic hyponatremia, HTN, HLD   Clinical Impression   PTA, pt was living at home alone, pt was independent with ADL/IADL and functional mobility without AD. Pt currently requires minA for toileting transfer, grooming at sink level, and functional mobility at RW level. Pt reports she is legally blind and has had no changes in vision, during visual assessment noted some nystagmus with pursuits and pt reporting she will "lose the image and then it will reappear". Due to decline in current level of function, pt would benefit from acute OT to address established goals to facilitate safe D/C to venue listed below. At this time, recommend CIR follow-up. Will continue to follow acutely.     Follow Up Recommendations  CIR    Equipment Recommendations  None recommended by OT    Recommendations for Other Services       Precautions / Restrictions Precautions Precautions: Fall Restrictions Weight Bearing Restrictions: No      Mobility Bed Mobility               General bed mobility comments: up in recliner    Transfers Overall transfer level: Needs assistance Equipment used: Rolling walker (2 wheeled) Transfers: Sit to/from Stand Sit to Stand: Min assist         General transfer comment: minA for safety and stability;1x loss of balance with initial stand    Balance Overall balance assessment: Needs assistance Sitting-balance  support: No upper extremity supported;Feet supported Sitting balance-Leahy Scale: Fair     Standing balance support: No upper extremity supported Standing balance-Leahy Scale: Poor Standing balance comment: incr ant-post sway without UE support                           ADL either performed or assessed with clinical judgement   ADL Overall ADL's : Needs assistance/impaired Eating/Feeding: Independent   Grooming: Minimal assistance;Standing;Oral care Grooming Details (indicate cue type and reason): standing at sink level, 1x minor loss of balance when reaching for items Upper Body Bathing: Set up;Sitting   Lower Body Bathing: Minimal assistance;Sit to/from stand   Upper Body Dressing : Set up;Sitting   Lower Body Dressing: Minimal assistance;Sit to/from stand   Toilet Transfer: Minimal assistance;Ambulation;Regular Glass blower/designer Details (indicate cue type and reason): pt ambulated to bathroom and urinated while on regular commode Toileting- Clothing Manipulation and Hygiene: Minimal assistance;Sit to/from stand Toileting - Clothing Manipulation Details (indicate cue type and reason): support for safety and stabiltiy with standing     Functional mobility during ADLs: Minimal assistance;Rolling walker General ADL Comments: pt limited by instabilty, BLE weakness, and decreased activity tolerance     Vision Baseline Vision/History: Legally blind Patient Visual Report: No change from baseline Vision Assessment?: Yes Eye Alignment: Within Functional Limits Tracking/Visual Pursuits: Unable to hold eye position out of midline;Decreased smoothness of eye movement to LEFT superior field;Decreased smoothness of eye movement to LEFT inferior field (nystagmus noted) Additional Comments: right eye lower lid ptosis, noted nystagmus with pursuits,  pt reports image "goes in and out", pt reports this occurs at baseline;limited assessment due to eye fatigue. Pt reports she is  able to see therapist, but wouldn't recognize her if she saw therapist in the hallway. Pt reports baseline vision     Perception     Praxis      Pertinent Vitals/Pain Pain Assessment: No/denies pain     Hand Dominance Right   Extremity/Trunk Assessment Upper Extremity Assessment Upper Extremity Assessment: Overall WFL for tasks assessed   Lower Extremity Assessment Lower Extremity Assessment: Generalized weakness   Cervical / Trunk Assessment Cervical / Trunk Assessment: Normal   Communication Communication Communication: No difficulties   Cognition Arousal/Alertness: Awake/alert Behavior During Therapy: WFL for tasks assessed/performed Overall Cognitive Status: Within Functional Limits for tasks assessed                                 General Comments: pt is aware of safety, current deficits and increased need for support   General Comments  daughter present at end of session,    Exercises     Shoulder Instructions      Home Living Family/patient expects to be discharged to:: Private residence Living Arrangements: Alone Available Help at Discharge: Family;Available PRN/intermittently Type of Home: House Home Access: Stairs to enter [employer] of Steps: 2+1 Entrance Stairs-Rails: None Home Layout: Multi-level (split level-laundry downstairs; living area up 7 steps) Alternate Level Stairs-Number of Steps: 7up; 7 down from entrance Alternate Level Stairs-Rails: Left Bathroom Shower/Tub: Teacher, early years/pre: Standard     Home Equipment: Environmental consultant - 2 wheels;Cane - single point;Bedside commode;Shower seat   Additional Comments: has chair in shower for shampoo, chair next to tub with phone for safety      Prior Functioning/Environment Level of Independence: Independent        Comments: after recent TIA returned to independent        OT Problem List: Decreased activity tolerance;Impaired balance (sitting and/or  standing);Decreased safety awareness;Decreased knowledge of use of DME or AE;Decreased knowledge of precautions      OT Treatment/Interventions: Self-care/ADL training;Therapeutic exercise;Energy conservation;DME and/or AE instruction;Therapeutic activities;Patient/family education;Balance training    OT Goals(Current goals can be found in the care plan section) Acute Rehab OT Goals Patient Stated Goal: to return to prior level of independence OT Goal Formulation: With patient Time For Goal Achievement: 06/23/20 Potential to Achieve Goals: Good ADL Goals Pt Will Perform Lower Body Dressing: with modified independence;sit to/from stand Pt Will Transfer to Toilet: with modified independence;ambulating Additional ADL Goal #1: Pt will demonstrate independence with 3 fall prevention strategies for safe engagement in ADL/IADL and functional mobility.  OT Frequency: Min 2X/week   Barriers to D/C:            Co-evaluation              AM-PAC OT "6 Clicks" Daily Activity     Outcome Measure Help from another person eating meals?: A Little Help from another person taking care of personal grooming?: A Little Help from another person toileting, which includes using toliet, bedpan, or urinal?: A Little Help from another person bathing (including washing, rinsing, drying)?: A Little Help from another person to put on and taking off regular upper body clothing?: A Little Help from another person to put on and taking off regular lower body clothing?: A Little 6 Click Score: 18   End of Session Equipment Utilized  During Treatment: Gait belt;Rolling walker Nurse Communication: Mobility status  Activity Tolerance: Patient tolerated treatment well Patient left: in chair;with call bell/phone within reach;with chair alarm set;with family/visitor present  OT Visit Diagnosis: Unsteadiness on feet (R26.81);Other abnormalities of gait and mobility (R26.89);Muscle weakness (generalized) (M62.81)                 Time: 8413-2440 OT Time Calculation (min): 30 min Charges:  OT General Charges $OT Visit: 1 Visit OT Evaluation $OT Eval Moderate Complexity: 1 Mod OT Treatments $Self Care/Home Management : 8-22 mins  Helene Kelp OTR/L Acute Rehabilitation Services Office: [place] 06/09/2020, 12:25 PM

## 2020-06-09 NOTE — PMR Pre-admission (Shared)
PMR Admission Coordinator Pre-Admission Assessment  Patient: Amber Kennedy is an 87 y.o., female MRN: [id number] DOB: 02-May-1933 Height: 5' 7"  (170.2 cm) Weight: 79.9 kg  Insurance Information HMO: yes    PPO:      PCP:      IPA:      80/20:      OTHER:  PRIMARY: [employer] Medicare      Policy#: [id number]      Subscriber: pt CM Name: Dot   Phone#: [phone] ext [phone]     Fax#: [phone] Pre-Cert#: [id number]      Employer:  Benefits:  Phone #: [phone]     Name: 5/16 Eff. Date: 01/26/2020     Deduct: none      Out of Pocket Max: $3900      Life Max: none CIR: $295 co pay per day days 1 until 6      SNF: no copay days 1 until 20; $188 co pay per day days 21 until 41; no copay days 42 until 100 Outpatient: $10 to $20 per visit     Co-Pay: visits per medical neccesity Home Health: 100%      Co-Pay: visits per medical neccesity DME: 80%     Co-Pay: 20% Providers: in network  SECONDARY: none      Policy#:      Phone#:   Development worker, community:       Phone#:   The Engineer, petroleum" for patients in Inpatient Rehabilitation Facilities with attached "Privacy Act Cecilia Records" was provided and verbally reviewed with: Patient and Family  Emergency Contact Information Contact Information    Name Relation Home Work Mobile   Lyons Daughter [phone]     [facility] Son   [phone]   Selinda Orion Daughter   [phone]      Current Medical History  Patient Admitting Diagnosis: Debility  History of Present Illness: 87 year old female with medical history significant for recent TIA on 04/27/2020, sick sinus syndrome s/p pacemaker placement on 04/30/2020, A fib, chronic hyponatremia, HTN, and HLD. Presented on 06/07/2020 with multiple complaints including facial droop , ear drainage and decreased appetite.  ENT consulted as well as Neurology . Otitis media and externa complicated by Bells' Palsy. Given concern for bacterial vs viral  infection, she was place on antibiotics with ceftriaxone as well as Valtrex with planned course for 7 days. She was also placed on steroids given Bell's palsy and ENT placed an otowick. Also placed on Cipro dex drops as well as eyedrops.  Acute on chronic hyponatremia. Felt likely hyponatremic now due to very poor po intake as well as watery diarrhea a few days prior to admit. Sodium on admit was 122, improved with fluids. Will continue fluids and recheck sodium daily .   Chronic a fib with RVR in the ED. To continue diltiazem, metoprolol and flecainide. To resume Eliquis. Rate controlled today.    Now with weakness and dizziness possible in the setting of the ear involvement.   Patient's medical record from [facility]  has been reviewed by the rehabilitation admission coordinator and physician.  Past Medical History  Past Medical History:  Diagnosis Date  . A-fib ([place])   . Gout   . Hypercholesterolemia   . Hypertension   . Hypokalemia   . Hyponatremia   . Pacemaker St. Jude Assurity MRI dual chamber pacemaker 04/30/2020 04/30/2020  . Sick sinus syndrome ([place]) 05/09/2020  . Vision loss     Family History  family history includes Sudden death in her father.  Prior Rehab/Hospitalizations Has the patient had prior rehab or hospitalizations prior to admission? Yes  Has the patient had major surgery during 100 days prior to admission? Yes   Current Medications  Current Facility-Administered Medications:  .  0.9 %  sodium chloride infusion, , Intravenous, Continuous, Kayleen Memos, DO, Last Rate: 50 mL/hr at 06/08/20 2201, New Bag at 06/08/20 2201 .  acetaminophen (TYLENOL) tablet 650 mg, 650 mg, Oral, Q6H PRN **OR** acetaminophen (TYLENOL) suppository 650 mg, 650 mg, Rectal, Q6H PRN, Nicoletta Dress, Na, MD .  apixaban (ELIQUIS) tablet 5 mg, 5 mg, Oral, BID, Donnamae Jude, RPH, 5 mg at 06/09/20 0901 .  artificial tears (LACRILUBE) ophthalmic ointment 1 application, 1 application, Right  Eye, TID, Nicoletta Dress, Na, MD, 1 application at 58/85/02 0901 .  artificial tears (LACRILUBE) ophthalmic ointment, , Right Eye, Q3H PRN, Nicoletta Dress, Na, MD .  cefTRIAXone (ROCEPHIN) 2 g in sodium chloride 0.9 % 100 mL IVPB, 2 g, Intravenous, Q24H, Gherghe, Costin M, MD, Last Rate: 200 mL/hr at 06/08/20 2019, 2 g at 06/08/20 2019 .  ciprofloxacin-dexamethasone (CIPRODEX) 0.3-0.1 % OTIC (EAR) suspension 4 drop, 4 drop, Right EAR, BID, Melida Quitter, MD, 4 drop at 06/09/20 0902 .  diltiazem (CARDIZEM CD) 24 hr capsule 180 mg, 180 mg, Oral, Daily, Li, Na, MD, 180 mg at 06/09/20 0901 .  flecainide (TAMBOCOR) tablet 50 mg, 50 mg, Oral, Q12H, Li, Na, MD, 50 mg at 06/09/20 0901 .  losartan (COZAAR) tablet 50 mg, 50 mg, Oral, Daily, Li, Na, MD, 50 mg at 06/09/20 0901 .  metoprolol succinate (TOPROL-XL) 24 hr tablet 100 mg, 100 mg, Oral, Daily, Li, Na, MD, 100 mg at 06/09/20 0901 .  ondansetron (ZOFRAN) tablet 4 mg, 4 mg, Oral, Q6H PRN **OR** ondansetron (ZOFRAN) injection 4 mg, 4 mg, Intravenous, Q6H PRN, Li, Na, MD .  polyethylene glycol (MIRALAX / GLYCOLAX) packet 17 g, 17 g, Oral, Daily PRN, Li, Na, MD .  polyvinyl alcohol (LIQUIFILM TEARS) 1.4 % ophthalmic solution 1 drop, 1 drop, Right Eye, Q1H PRN, Melida Quitter, MD, 1 drop at 06/08/20 1115 .  predniSONE (DELTASONE) tablet 60 mg, 60 mg, Oral, Q breakfast, Caren Griffins, MD, 60 mg at 06/09/20 0901 .  rosuvastatin (CRESTOR) tablet 20 mg, 20 mg, Oral, Daily, Li, Na, MD, 20 mg at 06/09/20 0901 .  valACYclovir (VALTREX) tablet 1,000 mg, 1,000 mg, Oral, TID, Hall, Carole N, DO, 1,000 mg at 06/09/20 0901  Patients Current Diet:  Diet Order            Diet regular Room service appropriate? Yes; Fluid consistency: Thin  Diet effective now                 Precautions / Restrictions Precautions Precautions: Fall Precaution Comments: Very fearful of falling Restrictions Weight Bearing Restrictions: No   Has the patient had 2 or more falls or a fall with injury  in the past year? No  Prior Activity Level Limited Community (1-2x/wk): Independent  Prior Functional Level Self Care: Did the patient need help bathing, dressing, using the toilet or eating? Independent  Indoor Mobility: Did the patient need assistance with walking from room to room (with or without device)? Independent  Stairs: Did the patient need assistance with internal or external stairs (with or without device)? Independent  Functional Cognition: Did the patient need help planning regular tasks such as shopping or remembering to take medications? [place] /  Equipment Home Assistive Devices/Equipment: None Home Equipment: Walker - 2 wheels,Cane - single point,Bedside commode,Shower seat  Prior Device Use: Indicate devices/aids used by the patient prior to current illness, exacerbation or injury? None of the above  Current Functional Level Cognition  Overall Cognitive Status: Within Functional Limits for tasks assessed Orientation Level: Oriented X4 General Comments: pt is aware of safety, current deficits and increased need for support    Extremity Assessment (includes Sensation/Coordination)  Upper Extremity Assessment: Overall WFL for tasks assessed  Lower Extremity Assessment: Generalized weakness    ADLs  Overall ADL's : Needs assistance/impaired Eating/Feeding: Independent Grooming: Minimal assistance,Standing,Oral care Grooming Details (indicate cue type and reason): standing at sink level, 1x minor loss of balance when reaching for items Upper Body Bathing: Set up,Sitting Lower Body Bathing: Minimal assistance,Sit to/from stand Upper Body Dressing : Set up,Sitting Lower Body Dressing: Minimal assistance,Sit to/from stand Toilet Transfer: Minimal Freight forwarder Details (indicate cue type and reason): pt ambulated to bathroom and urinated while on regular commode Toileting- Clothing Manipulation and  Hygiene: Minimal assistance,Sit to/from stand Toileting - Clothing Manipulation Details (indicate cue type and reason): support for safety and stabiltiy with standing Functional mobility during ADLs: Minimal assistance,Rolling walker General ADL Comments: pt limited by instabilty, BLE weakness, and decreased activity tolerance    Mobility  General bed mobility comments: up in recliner    Transfers  Overall transfer level: Needs assistance Equipment used: Rolling walker (2 wheeled) Transfers: Sit to/from Stand Sit to Stand: Min assist General transfer comment: minA for safety and stability;1x loss of balance with initial stand; worked on serial sit<>stands; good carryover of education for hand placement    Ambulation / Gait / Stairs / Emergency planning/management officer  Ambulation/Gait Ambulation/Gait assistance: Min assist,Mod assist Social research officer, government (Feet): 40 Feet Assistive device: Rolling walker (2 wheeled) Gait Pattern/deviations: Step-through pattern,Decreased stride length,Shuffle,Trunk flexed,Decreased dorsiflexion - right,Decreased dorsiflexion - left General Gait Details: Good carryover of RW use and proximity education compared to last session's notes; Very dependent on UE support from RW; cues to incr heel strike and stand tall as she weight shifted onto stance LE (both R and L) for incr satbiltiy Gait velocity: very slow    Posture / Balance Balance Overall balance assessment: Needs assistance Sitting-balance support: No upper extremity supported,Feet supported Sitting balance-Leahy Scale: Fair Standing balance support: No upper extremity supported Standing balance-Leahy Scale: Poor Standing balance comment: incr ant-post sway without UE support    Special needs/care consideration    Previous Home Environment  Living Arrangements: Alone  Lives With: Alone Available Help at Discharge: Family,Available PRN/intermittently (family aware of need for increased supports at home) Type of  Home: House Home Layout: Multi-level (split level-laundry downstairs; living area up 7 steps) Alternate Level Stairs-Rails: Left Alternate Level Stairs-Number of Steps: 7up; 7 down from entrance Home Access: Stairs to enter Entrance Stairs-Rails: None Entrance Stairs-Number of Steps: 2+1 Bathroom Shower/Tub: Optometrist: Yes How Accessible: Accessible via walker [place]: No Additional Comments: has chair in shower for shampoo, chair next to tub with phone for safety  Discharge Living Setting Plans for Discharge Living Setting: Patient's home,Alone Type of Home at Discharge: House Discharge Home Layout: Multi-level Alternate Level Stairs-Rails: Left Alternate Level Stairs-Number of Steps: 7 up adn 7 down in split level Discharge Home Access: Stairs to enter Entrance Stairs-Rails: None Entrance Stairs-Number of Steps: 2 + 1 Discharge Bathroom Shower/Tub: Tub/shower unit Discharge Bathroom Toilet: Standard Discharge Bathroom Accessibility: Yes How  Accessible: Accessible via walker Does the patient have any problems obtaining your medications?: No  Social/Family/Support Systems Contact Information: daughter, Wells Guiles  and son, Shanon Brow Anticipated Caregiver: family Anticipated Caregiver's Contact Information: see above Ability/Limitations of Caregiver: family will pull otgether to provide increased caregiver support; doubt it will be 24/7 Caregiver Availability: Other (Comment) Discharge Plan Discussed with Primary Caregiver: Yes Is Caregiver In Agreement with Plan?: Yes Does Caregiver/Family have Issues with Lodging/Transportation while Pt is in Rehab?: No  Goals Patient/Family Goal for Rehab: Mod I to supervision with PT and OT Expected length of stay: ELOS 10 to 14 days Pt/Family Agrees to Admission and willing to participate: Yes Program Orientation Provided & Reviewed with Pt/Caregiver Including Roles  &  Responsibilities: Yes  Decrease burden of Care through IP rehab admission: n/a  Possible need for SNF placement upon discharge: not anticipated  Patient Condition: I have reviewed medical records from [facility] , spoken with  patient, daughter and family member. I met with patient at the bedside for inpatient rehabilitation assessment.  Patient will benefit from ongoing PT and OT, can actively participate in 3 hours of therapy a day 5 days of the week, and can make measurable gains during the admission.  Patient will also benefit from the coordinated team approach during an Inpatient Acute Rehabilitation admission.  The patient will receive intensive therapy as well as Rehabilitation physician, nursing, social worker, and care management interventions.  Due to bladder management, bowel management, safety, skin/wound care, disease management, medication administration, pain management and patient education the patient requires 24 hour a day rehabilitation nursing.  The patient is currently min to mod assist with mobility and basic ADLs.  Discharge setting and therapy post discharge at home with home health is anticipated.  Patient has agreed to participate in the Acute Inpatient Rehabilitation Program and will admit today.  Preadmission Screen Completed By:  Cleatrice Burke, 06/09/2020 1:30 PM ______________________________________________________________________   Discussed status with Dr. Marland Kitchen on *** at *** and received approval for admission today.  Admission Coordinator:  Cleatrice Burke, RN, time Marland KitchenSudie Grumbling ***   Assessment/Plan: Diagnosis: 1. Does the need for close, 24 hr/day Medical supervision in concert with the patient's rehab needs make it unreasonable for this patient to be served in a less intensive setting? {yes_no_potentially:3041433} 2. Co-Morbidities requiring supervision/potential complications: *** 3. Due to {due NV:9166060}, does the patient require 24  hr/day rehab nursing? {yes_no_potentially:3041433} 4. Does the patient require coordinated care of a physician, rehab nurse, PT, OT, and SLP to address physical and functional deficits in the context of the above medical diagnosis(es)? {yes_no_potentially:3041433} Addressing deficits in the following areas: {deficits:3041436} 5. Can the patient actively participate in an intensive therapy program of at least 3 hrs of therapy 5 days a week? {yes_no_potentially:3041433} 6. The potential for patient to make measurable gains while on inpatient rehab is {potential:3041437} 7. Anticipated functional outcomes upon discharge from inpatient rehab: {functional outcomes:304600100} PT, {functional outcomes:304600100} OT, {functional outcomes:304600100} SLP 8. Estimated rehab length of stay to reach the above functional goals is: *** 9. Anticipated discharge destination: {anticipated dc setting:21604} 10. Overall Rehab/Functional Prognosis: {potential:3041437}   MD Signature: ***

## 2020-06-10 LAB — CBC
HCT: 37.8 % (ref 36.0–46.0)
Hemoglobin: 12.8 g/dL (ref 12.0–15.0)
MCH: 30.1 pg (ref 26.0–34.0)
MCHC: 33.9 g/dL (ref 30.0–36.0)
MCV: 88.9 fL (ref 80.0–100.0)
Platelets: 188 10*3/uL (ref 150–400)
RBC: 4.25 MIL/uL (ref 3.87–5.11)
RDW: 12.6 % (ref 11.5–15.5)
WBC: 9.2 10*3/uL (ref 4.0–10.5)
nRBC: 0 % (ref 0.0–0.2)

## 2020-06-10 LAB — BASIC METABOLIC PANEL
Anion gap: 6 (ref 5–15)
BUN: 19 mg/dL (ref 8–23)
CO2: 24 mmol/L (ref 22–32)
Calcium: 9.3 mg/dL (ref 8.9–10.3)
Chloride: 103 mmol/L (ref 98–111)
Creatinine, Ser: 1.07 mg/dL — ABNORMAL HIGH (ref 0.44–1.00)
GFR, Estimated: 51 mL/min — ABNORMAL LOW (ref >60)
Glucose, Bld: 121 mg/dL — ABNORMAL HIGH (ref 70–99)
Potassium: 5.1 mmol/L (ref 3.5–5.1)
Sodium: 133 mmol/L — ABNORMAL LOW (ref 135–145)

## 2020-06-10 NOTE — Progress Notes (Signed)
Inpatient [facility] has given a denial for CIR admit and I spoke with patient and she was made aware by Dr. Cruzita Lederer. We will sign off.  Danne Baxter, RN, MSN Rehab Admissions Coordinator [phone] 06/10/2020 6:54 PM

## 2020-06-10 NOTE — Progress Notes (Signed)
Physical Therapy Treatment Note  (Full Treatment Note to follow)  Scored 29/56 on the Berg Balance Assessment, indicative of high fall risk -- close to 100% within the next 12 months; Recommend use of RW full-time for stability with gait;   Pt is very motivated to improve, has been participating well, was moving independently before this episode of care; Has a very supportive family network; Continue to recommend comprehensive inpatient rehab (CIR) for post-acute therapy needs.    06/10/20 1000  Standardized Balance Assessment  Standardized Balance Assessment  Berg Balance Test  Berg Balance Test  Sit to Stand 2  Standing Unsupported 2  Sitting with Back Unsupported but Feet Supported on Floor or Stool 4  Stand to Sit 3  Transfers 2  Standing Unsupported with Eyes Closed 3  Standing Ubsupported with Feet Together 2  From Standing, Reach Forward with Outstretched Arm 3  From Standing Position, Pick up Object from Floor 3  From Standing Position, Turn to Look Behind Over each Shoulder 2  Turn 360 Degrees 1  Standing Unsupported, Alternately Place Feet on Step/Stool 0  Standing Unsupported, One Foot in Front 1  Standing on One Leg 1  Total Score [place], [place] Pager [phone] Office [phone]

## 2020-06-10 NOTE — Progress Notes (Signed)
Inpatient Rehabilitation Admissions Coordinator  [facility] Medicare MD has requested a peer to peer discussion with Dr. Cruzita Lederer. He is aware and he awaits call back. I met with patient at bedside and she is aware.  Danne Baxter, RN, MSN Rehab Admissions Coordinator [phone] 06/10/2020 12:11 PM

## 2020-06-10 NOTE — NC FL2 (Signed)
[place] LEVEL OF CARE SCREENING TOOL     IDENTIFICATION  Patient Name: Amber Kennedy Birthdate: 04/24/1933 Sex: female Admission Date (Current Location): 06/07/2020  [facility] and [place] Number:  Herbalist and Address:  [facility] [place]. [facility], [place] [address] [place], [place] [address]      Provider Number: [id number]  Attending Physician Name and Address:  Caren Griffins, MD  Relative Name and Phone Number:  Wells Guiles daughter, [phone]    Current Level of Care: Hospital Recommended Level of Care: [place] Prior Approval Number:    Date Approved/Denied:   PASRR Number: [id number]  Discharge Plan: SNF    Current Diagnoses: Patient Active Problem List   Diagnosis Date Noted  . Otitis media 06/07/2020  . Otitis externa 06/07/2020  . Bell's palsy 06/07/2020  . Acute otitis media 06/07/2020  . Sick sinus syndrome ([place]) 05/09/2020  . Pacemaker St. Jude Assurity MRI dual chamber pacemaker 04/30/2020 04/30/2020  . Carotid stenosis, left   . TIA (transient ischemic attack) 04/27/2020  . Bradycardia 04/27/2020  . Leg edema 09/20/2018  . Paroxysmal atrial fibrillation ([place]) 09/20/2018  . Atrial fibrillation with RVR ([place]) 02/05/2017  . Essential hypertension 02/05/2017  . Hyponatremia 02/05/2017  . Hypokalemia 02/05/2017    Orientation RESPIRATION BLADDER Height & Weight     Self,Time,Place,Situation  Normal Continent Weight: 176 lb 2.4 oz (79.9 kg) Height:  5\' 7"  (170.2 cm)  BEHAVIORAL SYMPTOMS/MOOD NEUROLOGICAL BOWEL NUTRITION STATUS      Continent Diet (Please see DC Summary)  AMBULATORY STATUS COMMUNICATION OF NEEDS Skin   Limited Assist Verbally Normal                       Personal Care Assistance Level of Assistance  Bathing,Feeding,Dressing Bathing Assistance: Limited assistance Feeding assistance: Independent Dressing Assistance: Independent     Functional Limitations Info   Sight,Hearing Sight Info: Impaired Hearing Info: Impaired      SPECIAL CARE FACTORS FREQUENCY  PT (By licensed PT),OT (By licensed OT)     PT Frequency: 5x/week OT Frequency: 5x/week            Contractures Contractures Info: Not present    Additional Factors Info  Code Status,Allergies Code Status Info: Full Allergies Info: Allopurinol, Amlodipine Besylate, Penicillins, Albuterol, Latex, Penicillin G           Current Medications (06/10/2020):  This is the current hospital active medication list Current Facility-Administered Medications  Medication Dose Route Frequency Provider Last Rate Last Admin  . acetaminophen (TYLENOL) tablet 650 mg  650 mg Oral Q6H PRN Nicoletta Dress, Na, MD       Or  . acetaminophen (TYLENOL) suppository 650 mg  650 mg Rectal Q6H PRN Nicoletta Dress, Na, MD      . apixaban (ELIQUIS) tablet 5 mg  5 mg Oral BID Donnamae Jude, RPH   5 mg at 06/10/20 0908  . artificial tears (LACRILUBE) ophthalmic ointment 1 application  1 application Right Eye TID Charlann Lange, MD   1 application at 11/21/23 1614  . artificial tears (LACRILUBE) ophthalmic ointment   Right Eye Q3H PRN Nicoletta Dress, Na, MD      . cefTRIAXone (ROCEPHIN) 2 g in sodium chloride 0.9 % 100 mL IVPB  2 g Intravenous Q24H Caren Griffins, MD 200 mL/hr at 06/09/20 2138 2 g at 06/09/20 2138  . ciprofloxacin-dexamethasone (CIPRODEX) 0.3-0.1 % OTIC (EAR) suspension 4 drop  4 drop Right EAR BID Redmond Baseman,  Orpah Greek, MD   4 drop at 06/10/20 0910  . diltiazem (CARDIZEM CD) 24 hr capsule 180 mg  180 mg Oral Daily Li, Na, MD   180 mg at 06/10/20 0908  . flecainide (TAMBOCOR) tablet 50 mg  50 mg Oral Q12H Li, Na, MD   50 mg at 06/10/20 0908  . losartan (COZAAR) tablet 50 mg  50 mg Oral Daily Nicoletta Dress, Na, MD   50 mg at 06/10/20 0908  . metoprolol succinate (TOPROL-XL) 24 hr tablet 100 mg  100 mg Oral Daily Li, Na, MD   100 mg at 06/10/20 0908  . ondansetron (ZOFRAN) tablet 4 mg  4 mg Oral Q6H PRN Nicoletta Dress, Na, MD       Or  . ondansetron (ZOFRAN) injection 4  mg  4 mg Intravenous Q6H PRN Li, Na, MD      . polyethylene glycol (MIRALAX / GLYCOLAX) packet 17 g  17 g Oral Daily PRN Li, Na, MD      . polyvinyl alcohol (LIQUIFILM TEARS) 1.4 % ophthalmic solution 1 drop  1 drop Right Eye Q1H PRN Melida Quitter, MD   1 drop at 06/10/20 1121  . predniSONE (DELTASONE) tablet 60 mg  60 mg Oral Q breakfast Caren Griffins, MD   60 mg at 06/10/20 0907  . rosuvastatin (CRESTOR) tablet 20 mg  20 mg Oral Daily Nicoletta Dress, Na, MD   20 mg at 06/10/20 0908  . valACYclovir (VALTREX) tablet 1,000 mg  1,000 mg Oral TID Irene Pap N, DO   1,000 mg at 06/10/20 1614     Discharge Medications: Please see discharge summary for a list of discharge medications.  Relevant Imaging Results:  Relevant Lab Results:   Additional Information SSn: [id number]. Not vaccinated for covid  Benard Halsted, LCSW

## 2020-06-10 NOTE — TOC Initial Note (Signed)
Transition of Care [facility]) - Initial/Assessment Note    Patient Details  Name: Amber Kennedy MRN: [id number] Date of Birth: 1933-09-18  Transition [facility]) CM/SW Contact:    Benard Halsted, LCSW Phone Number: 06/10/2020, 6:45 PM  Clinical Narrative:                 CSW received consult for possible SNF placement at time of discharge since CIR was denied by insruance. CSW spoke with patient. Patient reported that patient's family is currently unable to care for patient at their home given patient's current physical needs and fall risk. Patient expressed understanding of PT recommendation and is agreeable to SNF placement at time of discharge. Patient reports preference for somewhere like [place]. CSW discussed insurance authorization process and provided Medicare SNF ratings list. Patient has not received the COVID vaccines. Patient expressed being hopeful for rehab and to feel better soon. Patient will review bed offers.    Expected Discharge Plan: Skilled Nursing Facility Barriers to Discharge: Insurance Authorization,SNF Pending bed offer   Patient Goals and CMS Choice Patient states their goals for this hospitalization and ongoing recovery are:: Rehab CMS Medicare.gov Compare Post Acute Care list provided to:: Patient Choice offered to / list presented to : Patient  Expected Discharge Plan and Services Expected Discharge Plan: [place] In-house Referral: Clinical Social Work   Post Acute Care Choice: [place] Living arrangements for the past 2 months: [place]                                      Prior Living Arrangements/Services Living arrangements for the past 2 months: Single Family Home Lives with:: Self Patient language and need for interpreter reviewed:: Yes Do you feel safe going back to the place where you live?: Yes      Need for Family Participation in Patient Care: No (Comment) Care giver support  system in place?: Yes (comment) Current home services: DME Criminal Activity/Legal Involvement Pertinent to Current Situation/Hospitalization: No - Comment as needed  Activities of Daily Living Home Assistive Devices/Equipment: None ADL Screening (condition at time of admission) Patient's cognitive ability adequate to safely complete daily activities?: Yes Is the patient deaf or have difficulty hearing?: Yes Does the patient have difficulty seeing, even when wearing glasses/contacts?: Yes Does the patient have difficulty concentrating, remembering, or making decisions?: No Patient able to express need for assistance with ADLs?: Yes Does the patient have difficulty dressing or bathing?: No Independently performs ADLs?: Yes (appropriate for developmental age) Does the patient have difficulty walking or climbing stairs?: Yes Weakness of Legs: None Weakness of Arms/Hands: None  Permission Sought/Granted Permission sought to share information with : Facility Contact Representative,Family Supports Permission granted to share information with : Yes, Verbal Permission Granted  Share Information with NAME: Shanon Brow  Permission granted to share info w AGENCY: SNFs  Permission granted to share info w Relationship: Son  Permission granted to share info w Contact Information: [phone]  Emotional Assessment Appearance:: Appears stated age Attitude/Demeanor/Rapport: Engaged,Gracious Affect (typically observed): Accepting,Appropriate,Pleasant Orientation: : Oriented to Self,Oriented to Place,Oriented to  Time,Oriented to Situation Alcohol / Substance Use: Not Applicable Psych Involvement: No (comment)  Admission diagnosis:  Bell's palsy [G51.0] Hyponatremia [E87.1] Acute otitis media, unspecified otitis media type [H66.90] Patient Active Problem List   Diagnosis Date Noted  . Otitis media 06/07/2020  . Otitis externa 06/07/2020  .  Bell's palsy 06/07/2020  . Acute otitis media 06/07/2020   . Sick sinus syndrome ([place]) 05/09/2020  . Pacemaker St. Jude Assurity MRI dual chamber pacemaker 04/30/2020 04/30/2020  . Carotid stenosis, left   . TIA (transient ischemic attack) 04/27/2020  . Bradycardia 04/27/2020  . Leg edema 09/20/2018  . Paroxysmal atrial fibrillation ([place]) 09/20/2018  . Atrial fibrillation with RVR ([place]) 02/05/2017  . Essential hypertension 02/05/2017  . Hyponatremia 02/05/2017  . Hypokalemia 02/05/2017   PCP:  [name], MD Pharmacy:   [facility] [address] [place] [address] Phone: [phone] Fax: [phone]     Social Determinants of Health (SDOH) Interventions    Readmission Risk Interventions No flowsheet data found.

## 2020-06-10 NOTE — Progress Notes (Signed)
Physical Therapy Treatment Patient Details Name: Amber Kennedy MRN: [id number] DOB: [birth year] Today's Date: 06/10/2020    History of Present Illness 87 y.o. female who presented 06/07/20 with multiple complaints including facial droop, ear drainage and decreased appetite. Head CT showed No acute intracranial abnormality.  CT temporal bones showed suggested external otitis, otitis media and mastoiditis. Rt sided Bell's Palsy, hyponatremia, afib with RVR  PMH significant of recent TIA on 04/27/2020, SSS s/p pacemaker placement on 04/30/2020, A. fib, chronic hyponatremia, HTN, HLD    PT Comments    Continuing work on functional mobility and activity tolerance;  Scored 29/56 on the Amber Kennedy Assessment (see other PT note of this date for details), indicative of high fall risk -- close to 100% within the next 12 months; Recommend use of RW full-time for stability with gait;   Pt is very motivated to improve, has been participating well, was moving independently before this episode of care; Has a very supportive family network; Continue to recommend comprehensive inpatient rehab (CIR) for post-acute therapy needs.   Follow Up Recommendations  CIR     Equipment Recommendations  None recommended by PT    Recommendations for Other Services       Precautions / Restrictions Precautions Precautions: Fall Precaution Comments: Very fearful of falling    Mobility  Bed Mobility Overal bed mobility: Needs Assistance Bed Mobility: Supine to Sit     Supine to sit: Min guard     General bed mobility comments: [place] for safety; Slow movement and use of bedrails    Transfers Overall transfer level: Needs assistance Equipment used: Rolling walker (2 wheeled) Transfers: Sit to/from Stand Sit to Stand: Min assist         General transfer comment: minA for safety and stability;1x loss of balance with initial stand;  good carryover of education for hand placement  Ambulation/Gait              General Gait Details: Pivot steps bed to recliner, and took many steps with turning 360 deg during [employer]    Modified Rankin (Stroke Patients Only)       Balance     Sitting balance-Leahy Scale: Fair       Standing balance-Leahy Scale: Poor Standing balance comment: Berg score of 29/56                            Cognition Arousal/Alertness: Awake/alert Behavior During Therapy: WFL for tasks assessed/performed Overall Cognitive Status: Within Functional Limits for tasks assessed                                 General Comments: pt is aware of safety, current deficits and increased need for support      Exercises      General Comments        Pertinent Vitals/Pain Pain Assessment: No/denies pain    Home Living                      Prior Function            PT Goals (current goals can now be found in the care plan section) Acute Rehab PT Goals Patient Stated Goal: to return to prior level of independence; be able to play with great  grandkids (often getting down on teh floor and up again) PT Goal Formulation: With patient Time For Goal Achievement: 06/22/20 Potential to Achieve Goals: Good Progress towards PT goals: Progressing toward goals    Frequency    Min 3X/week      PT Plan Current plan remains appropriate    Co-evaluation              AM-PAC PT "6 Clicks" Mobility   Outcome Measure  Help needed turning from your back to your side while in a flat bed without using bedrails?: None Help needed moving from lying on your back to sitting on the side of a flat bed without using bedrails?: A Little Help needed moving to and from a bed to a chair (including a wheelchair)?: A Little Help needed standing up from a chair using your arms (e.g., wheelchair or bedside chair)?: A Little Help needed to walk in hospital room?: A Little Help needed climbing 3-5  steps with a railing? : A Little 6 Click Score: 19    End of Session Equipment Utilized During Treatment: Gait belt Activity Tolerance: Patient tolerated treatment well Patient left: in chair;with call bell/phone within reach;Other (comment) ([place] alarm pad placed) Nurse Communication: Mobility status PT Visit Diagnosis: Unsteadiness on feet (R26.81);Difficulty in walking, not elsewhere classified (R26.2)     Time: 1950-9326 PT Time Calculation (min) (ACUTE ONLY): 37 min  Charges:  $Therapeutic Activity: 23-37 mins                     Amber Kennedy, PT  Acute Rehabilitation Services Pager [phone] Office [phone]    Amber Kennedy 06/10/2020, 2:15 PM

## 2020-06-10 NOTE — Progress Notes (Signed)
Subjective: Some dizziness.  Right ear continues with no pain.  Objective: Vital signs in last 24 hours: Temp:  [97.5 F (36.4 C)-98.3 F (36.8 C)] 98 F (36.7 C) (05/17 0505) Pulse Rate:  [69-80] 69 (05/17 0505) Resp:  [16-18] 17 (05/17 0505) BP: (127-157)/(75-100) 157/89 (05/17 0505) SpO2:  [97 %-100 %] 100 % (05/17 0505) Wt Readings from Last 1 Encounters:  06/07/20 79.9 kg    Intake/Output from previous day: 05/16 0701 - 05/17 0700 In: 2284.3 [P.O.:480; I.V.:1804.3] Out: -  Intake/Output this shift: No intake/output data recorded.  General appearance: alert, cooperative and no distress Ears: right conchal bowl ulceration improved somewhat, otowick not present, moist pale cerumen, unable to visualize TM Neurologic: Motor: right face without movement or tone  Recent Labs    06/08/20 0308 06/10/20 0135  WBC 7.6 9.2  HGB 13.2 12.8  HCT 38.2 37.8  PLT 170 188    Recent Labs    06/09/20 1604 06/10/20 0135  NA 131* 133*  K 4.5 5.1  CL 102 103  CO2 22 24  GLUCOSE 182* 121*  BUN 24* 19  CREATININE 1.19* 1.07*  CALCIUM 9.0 9.3    Medications: I have reviewed the patient's current medications.  Assessment/Plan: Right otitis externa, possible zoster oticus, and right facial paralysis  [place] had come out.  Continue current regimen including Ciprodex drops for total of one week, 3rd gen cephalosporin for 10 days, anti-viral for 7-10 days, and systemic steroids for 10 days.  Follow-up as outpatient for ear cleaning.  Emphasize right eye moisturization and care.   LOS: 2 days   Melida Quitter 06/10/2020, 7:37 AM

## 2020-06-10 NOTE — Progress Notes (Signed)
PROGRESS NOTE  Amber Kennedy Moder KKX:[id number] DOB: 24-Jul-1933 DOA: 06/07/2020 PCP: [name], MD   LOS: 2 days   Brief Narrative / Interim history: 87 year old female with history of recent TIA in April 2022, sick sinus syndrome status post pacemaker April 2022, chronic A. fib, chronic hyponatremia, hypertension, hyperlipidemia came into the hospital with severe drainage, decreased appetite, fatigue as well as facial droop.  ENT and neurology consulted  Subjective / 24h Interval events: Continues to feel weak, dizzy at times.  She otherwise no complaints, no chest pain, no shortness of breath  Assessment & Plan: Principal Problem Otitis media and externa complicated by right-sided Bell's palsy -clinical manifestation consistent with otitis media and externa.  ENT as well as neurology consulted in the ED, appreciate input.  Given concern for bacterial vs viral infection, she was placed on antibiotics with ceftriaxone as well as Valtrex with plans for a 10-day course.  She was also placed on steroids given Bell's palsy, and ENT placed an otowick which was removed this morning 5/16.  She is also on Ciprodex drops as well as eyedrops.  ENT recommends third-generation cephalosporin for total of 10 days  Active Problems Acute on chronic hyponatremia-patient has had few episodes of hyponatremia in the past usually in the setting of dehydration, most recent sodium values were within normal limits last month.  She is likely hyponatremic now due to very poor p.o. intake as well as watery diarrhea for couple of days prior to admission.  Sodium on admission was 122, she was given gentle hydration and sodium has now improved to 133.  Stop further IV fluids, she has good p.o. intake  Chronic A. fib, RVR in the ED-continue diltiazem, metoprolol, flecainide.  Rate controlled, continue Eliquis  Essential hypertension-continue home regimen as below  Hyperlipidemia-continue statin  Sick sinus syndrome  status post pacemaker-stable  Recent TIA -on Eliquis as above, continue statin  Weakness-with some dizziness possibly in the setting of ear involvement.  PT recommends CIR, she was denied by insurance, called the insurance company this morning and awaiting callback.  May need SNF as an alternative  Scheduled Meds: . apixaban  5 mg Oral BID  . artificial tears  1 application Right Eye TID  . ciprofloxacin-dexamethasone  4 drop Right EAR BID  . diltiazem  180 mg Oral Daily  . flecainide  50 mg Oral Q12H  . losartan  50 mg Oral Daily  . metoprolol succinate  100 mg Oral Daily  . predniSONE  60 mg Oral Q breakfast  . rosuvastatin  20 mg Oral Daily  . valACYclovir  1,000 mg Oral TID   Continuous Infusions: . cefTRIAXone (ROCEPHIN)  IV 2 g (06/09/20 2138)   PRN Meds:.acetaminophen **OR** acetaminophen, artificial tears, ondansetron **OR** ondansetron (ZOFRAN) IV, polyethylene glycol, polyvinyl alcohol  Diet Orders (From admission, onward)    Start     Ordered   06/08/20 0838  Diet regular Room service appropriate? Yes; Fluid consistency: Thin  Diet effective now       Question Answer Comment  Room service appropriate? Yes   Fluid consistency: Thin      06/08/20 0837          DVT prophylaxis: SCDs Start: 06/07/20 1930 apixaban (ELIQUIS) tablet 5 mg     Code Status: Full Code  Family Communication: Daughter present at bedside  Status is: Inpatient  : Persistent severe electrolyte disturbances  Dispo: The patient is from: Home  Anticipated d/c is to: CIR              Patient currently is not medically stable to d/c.   Difficult to place patient No  Level of care: Telemetry Medical  Consultants:  ENT, neurology  Procedures:  None  Microbiology  Blood cultures 5/14-pending  Antimicrobials: Ceftriaxone 5/14 >> Valtrex 5/14 >>   Objective: Vitals:   06/09/20 1820 06/09/20 2234 06/10/20 0505 06/10/20 0908  BP: (!) 143/81 (!) 149/100 (!) 157/89 (!)  143/87  Pulse: 74 80 69   Resp: 16 18 17    Temp: (!) 97.5 F (36.4 C) 98.3 F (36.8 C) 98 F (36.7 C)   TempSrc: Oral Oral Oral   SpO2: 97% 97% 100%   Weight:      Height:        Intake/Output Summary (Last 24 hours) at 06/10/2020 0953 Last data filed at 06/09/2020 1749 Gross per 24 hour  Intake 2284.31 ml  Output --  Net 2284.31 ml   Filed Weights   06/07/20 1117 06/07/20 1900  Weight: 79.8 kg 79.9 kg    Examination:  Constitutional: NAD Eyes: No icterus ENMT: mmm Neck: normal, supple Respiratory: Clear bilaterally, no wheezing, no crackles Cardiovascular: Irregular, no murmurs appreciated, no peripheral edema Abdomen: Nondistended, nontender, bowel sounds positive Musculoskeletal: no clubbing / cyanosis.  Skin: No rashes seen Neurologic: Bell's palsy present.  Equal strength otherwise, no focal deficits  Data Reviewed: I have independently reviewed following labs and imaging studies   CBC: Recent Labs  Lab 06/07/20 1104 06/08/20 0308 06/10/20 0135  WBC 9.7 7.6 9.2  NEUTROABS 6.7 6.0  --   HGB 14.8 13.2 12.8  HCT 43.1 38.2 37.8  MCV 88.0 87.6 88.9  PLT 184 170 381   Basic Metabolic Panel: Recent Labs  Lab 06/08/20 1230 06/08/20 1831 06/09/20 0356 06/09/20 1604 06/10/20 0135  NA 127* 125* 130* 131* 133*  K 3.8 4.3 4.1 4.5 5.1  CL 96* 96* 101 102 103  CO2 22 20* 21* 22 24  GLUCOSE 113* 194* 129* 182* 121*  BUN 22 23 21  24* 19  CREATININE 0.97 1.22* 1.07* 1.19* 1.07*  CALCIUM 9.1 9.0 9.3 9.0 9.3   Liver Function Tests: Recent Labs  Lab 06/07/20 1104  AST 34  ALT 18  ALKPHOS 71  BILITOT 1.8*  PROT 7.4  ALBUMIN 3.9   Coagulation Profile: Recent Labs  Lab 06/07/20 2139  INR 1.1   HbA1C: No results for input(s): HGBA1C in the last 72 hours. CBG: No results for input(s): GLUCAP in the last 168 hours.  Recent Results (from the past 240 hour(s))  SARS CORONAVIRUS 2 (TAT 6-24 HRS) Nasopharyngeal Nasopharyngeal Swab     Status: None    Collection Time: 06/07/20  2:05 PM   Specimen: Nasopharyngeal Swab  Result Value Ref Range Status   SARS Coronavirus 2 NEGATIVE NEGATIVE Final    Comment: (NOTE) SARS-CoV-2 target nucleic acids are NOT DETECTED.  The SARS-CoV-2 RNA is generally detectable in upper and lower respiratory specimens during the acute phase of infection. Negative results do not preclude SARS-CoV-2 infection, do not rule out co-infections with other pathogens, and should not be used as the sole basis for treatment or other patient management decisions. Negative results must be combined with clinical observations, patient history, and epidemiological information. The expected result is Negative.  Fact Sheet for Patients: SugarRoll.be  Fact Sheet for Healthcare Providers: https://www.woods-mathews.com/  This test is not yet approved or cleared by the [place]  FDA and  has been authorized for detection and/or diagnosis of SARS-CoV-2 by FDA under an Emergency Use Authorization (EUA). This EUA will remain  in effect (meaning this test can be used) for the duration of the COVID-19 declaration under Se ction 564(b)(1) of the Act, 21 U.S.C. section 360bbb-3(b)(1), unless the authorization is terminated or revoked sooner.  Performed at [facility], [place] [address], [place], [place] [address]   Culture, blood (Routine X 2) w Reflex to ID Panel     Status: None (Preliminary result)   Collection Time: 06/07/20  9:39 PM   Specimen: BLOOD RIGHT ARM  Result Value Ref Range Status   Specimen Description BLOOD RIGHT ARM  Final   Special Requests   Final    BOTTLES DRAWN AEROBIC ONLY Blood Culture results may not be optimal due to an inadequate volume of blood received in culture bottles   Culture   Final    NO GROWTH 3 DAYS Performed at [facility], [place] [address], [place], [place] [address]    Report Status PENDING  Incomplete  Culture, blood (Routine X  2) w Reflex to ID Panel     Status: None (Preliminary result)   Collection Time: 06/07/20  9:48 PM   Specimen: BLOOD LEFT HAND  Result Value Ref Range Status   Specimen Description BLOOD LEFT HAND  Final   Special Requests   Final    BOTTLES DRAWN AEROBIC ONLY Blood Culture results may not be optimal due to an inadequate volume of blood received in culture bottles   Culture   Final    NO GROWTH 3 DAYS Performed at [facility], [place] [address] [place], [place] [address]    Report Status PENDING  Incomplete     Radiology Studies: No results found.  Marzetta Board, MD, PhD [facility]  Between 7 am - 7 pm I am available, please contact me via Amion (for emergencies) or Securechat (non urgent messages)  Between 7 pm - 7 am I am not available, please contact night coverage MD/APP via Amion

## 2020-06-11 LAB — BASIC METABOLIC PANEL
Anion gap: 8 (ref 5–15)
BUN: 21 mg/dL (ref 8–23)
CO2: 24 mmol/L (ref 22–32)
Calcium: 9.2 mg/dL (ref 8.9–10.3)
Chloride: 100 mmol/L (ref 98–111)
Creatinine, Ser: 0.96 mg/dL (ref 0.44–1.00)
GFR, Estimated: 58 mL/min — ABNORMAL LOW (ref >60)
Glucose, Bld: 111 mg/dL — ABNORMAL HIGH (ref 70–99)
Potassium: 4.4 mmol/L (ref 3.5–5.1)
Sodium: 132 mmol/L — ABNORMAL LOW (ref 135–145)

## 2020-06-11 LAB — SARS CORONAVIRUS 2 (TAT 6-24 HRS): SARS Coronavirus 2: NEGATIVE

## 2020-06-11 NOTE — Care Management Important Message (Signed)
Important Message  Patient Details  Name: Amber Kennedy MRN: [id number] Date of Birth: [birth year]   Medicare Important Message Given:  Yes     [name] [address] 06/11/2020, 2:45 PM

## 2020-06-11 NOTE — TOC Progression Note (Addendum)
Transition of Care [facility]) - Progression Note    Patient Details  Name: Amber Kennedy MRN: [id number] Date of Birth: November 24, 1933  Transition of Care [facility]) CM/SW [place], [place] Phone Number: 06/11/2020, 2:46 PM  Clinical Narrative:     CSW gave pt bed offers. Pt stated [facility] is her preference. [facility] is still pending in the hub, csw reached out to admissions coordinator and is waiting on a response. CSW encouraged pt to review others on list that have accepted pt. Pt is managed by [place] and the facility will need to start insurance auth. Pts covid test is pending.   Expected Discharge Plan: Skilled Nursing Facility Barriers to Discharge: Insurance Authorization,SNF Pending bed offer  Expected Discharge Plan and Services Expected Discharge Plan: [place] In-house Referral: Clinical Social Work   Post Acute Care Choice: [place] Living arrangements for the past 2 months: Single Family Home                                       Social Determinants of Health (SDOH) Interventions    Readmission Risk Interventions No flowsheet data found.  Emeterio Reeve, Latanya Presser, [place] Social Worker [phone]

## 2020-06-11 NOTE — Progress Notes (Signed)
PROGRESS NOTE  Sharae Zappulla Brechtel OZH:[id number] DOB: December 13, 1933 DOA: 06/07/2020 PCP: [name], MD   LOS: 3 days   Brief Narrative / Interim history: 87 year old female with history of recent TIA in April 2022, sick sinus syndrome status post pacemaker April 2022, chronic A. fib, chronic hyponatremia, hypertension, hyperlipidemia came into the hospital with severe drainage, decreased appetite, fatigue as well as facial droop.  ENT and neurology consulted  Subjective / 24h Interval events: Continues to feel weak.  No chest pain, no shortness of breath.  Dizziness still present  Assessment & Plan: Principal Problem Otitis media and externa complicated by right-sided Bell's palsy -clinical manifestation consistent with otitis media and externa.  ENT as well as neurology consulted in the ED, appreciate input.  Given concern for bacterial vs viral infection, she was placed on antibiotics with ceftriaxone as well as Valtrex with plans for a 10-day course.  She was also placed on steroids given Bell's palsy, and ENT placed an otowick which was removed this morning 5/16.  She is also on Ciprodex drops as well as eyedrops.  ENT recommends third-generation cephalosporin for total of 10 days.  Stable today, continue current regimen  Active Problems Acute on chronic hyponatremia-patient has had few episodes of hyponatremia in the past usually in the setting of dehydration, most recent sodium values were within normal limits last month.  She is likely hyponatremic now due to very poor p.o. intake as well as watery diarrhea for couple of days prior to admission.  Sodium on admission was 122, she was given gentle hydration and sodium has improved and has remained overall stable.  She is now watched off fluids  Chronic A. fib, RVR in the ED-continue diltiazem, metoprolol, flecainide.  Rate controlled, continue Eliquis  Essential hypertension-continue home regimen as below  Hyperlipidemia-continue  statin  Sick sinus syndrome status post pacemaker-stable  Recent TIA -on Eliquis as above, continue statin  Weakness-with some dizziness possibly in the setting of ear involvement.  PT recommends CIR, she was denied by insurance, appealed yesterday but insurance continues to deny.  She is agreeable to SNF, social worker consulted, stable for discharge when bed is available  Scheduled Meds: . apixaban  5 mg Oral BID  . artificial tears  1 application Right Eye TID  . ciprofloxacin-dexamethasone  4 drop Right EAR BID  . diltiazem  180 mg Oral Daily  . flecainide  50 mg Oral Q12H  . losartan  50 mg Oral Daily  . metoprolol succinate  100 mg Oral Daily  . predniSONE  60 mg Oral Q breakfast  . rosuvastatin  20 mg Oral Daily  . valACYclovir  1,000 mg Oral TID   Continuous Infusions: . cefTRIAXone (ROCEPHIN)  IV 2 g (06/10/20 2054)   PRN Meds:.acetaminophen **OR** acetaminophen, artificial tears, ondansetron **OR** ondansetron (ZOFRAN) IV, polyethylene glycol, polyvinyl alcohol  Diet Orders (From admission, onward)    Start     Ordered   06/08/20 0838  Diet regular Room service appropriate? Yes; Fluid consistency: Thin  Diet effective now       Question Answer Comment  Room service appropriate? Yes   Fluid consistency: Thin      06/08/20 0837          DVT prophylaxis: SCDs Start: 06/07/20 1930 apixaban (ELIQUIS) tablet 5 mg     Code Status: Full Code  Family Communication: Daughter present at bedside  Status is: Inpatient  : Persistent severe electrolyte disturbances  Dispo: The patient is from: Home  Anticipated d/c is to: CIR              Patient currently is not medically stable to d/c.   Difficult to place patient No  Level of care: Telemetry Medical  Consultants:  ENT, neurology  Procedures:  None  Microbiology  Blood cultures 5/14-pending  Antimicrobials: Ceftriaxone 5/14 >> Valtrex 5/14 >>   Objective: Vitals:   06/10/20 1201  06/10/20 1817 06/10/20 2306 06/11/20 0433  BP:  (!) 141/81 (!) 150/90 (!) 155/99  Pulse: 86 76 82 68  Resp:  19 18 18   Temp: 98.6 F (37 C) 98.1 F (36.7 C) 97.6 F (36.4 C) 98.6 F (37 C)  TempSrc: Oral Oral Oral Oral  SpO2: 97% 99% 98% 97%  Weight:      Height:        Intake/Output Summary (Last 24 hours) at 06/11/2020 1054 Last data filed at 06/10/2020 2030 Gross per 24 hour  Intake 240 ml  Output --  Net 240 ml   Filed Weights   06/07/20 1117 06/07/20 1900  Weight: 79.8 kg 79.9 kg    Examination:  Constitutional: No distress Eyes: No icterus ENMT: mmm Neck: normal, supple Respiratory: Clear bilaterally without wheezing or crackles Cardiovascular: Irregular, no murmurs, no edema Abdomen: Soft, NT, ND, positive bowel sounds Musculoskeletal: no clubbing / cyanosis.  Skin: No rashes seen Neurologic: Bell's palsy present.  Equal strength otherwise, no new focal deficits  Data Reviewed: I have independently reviewed following labs and imaging studies   CBC: Recent Labs  Lab 06/07/20 1104 06/08/20 0308 06/10/20 0135  WBC 9.7 7.6 9.2  NEUTROABS 6.7 6.0  --   HGB 14.8 13.2 12.8  HCT 43.1 38.2 37.8  MCV 88.0 87.6 88.9  PLT 184 170 382   Basic Metabolic Panel: Recent Labs  Lab 06/08/20 1831 06/09/20 0356 06/09/20 1604 06/10/20 0135 06/11/20 0247  NA 125* 130* 131* 133* 132*  K 4.3 4.1 4.5 5.1 4.4  CL 96* 101 102 103 100  CO2 20* 21* 22 24 24   GLUCOSE 194* 129* 182* 121* 111*  BUN 23 21 24* 19 21  CREATININE 1.22* 1.07* 1.19* 1.07* 0.96  CALCIUM 9.0 9.3 9.0 9.3 9.2   Liver Function Tests: Recent Labs  Lab 06/07/20 1104  AST 34  ALT 18  ALKPHOS 71  BILITOT 1.8*  PROT 7.4  ALBUMIN 3.9   Coagulation Profile: Recent Labs  Lab 06/07/20 2139  INR 1.1   HbA1C: No results for input(s): HGBA1C in the last 72 hours. CBG: No results for input(s): GLUCAP in the last 168 hours.  Recent Results (from the past 240 hour(s))  SARS CORONAVIRUS 2 (TAT  6-24 HRS) Nasopharyngeal Nasopharyngeal Swab     Status: None   Collection Time: 06/07/20  2:05 PM   Specimen: Nasopharyngeal Swab  Result Value Ref Range Status   SARS Coronavirus 2 NEGATIVE NEGATIVE Final    Comment: (NOTE) SARS-CoV-2 target nucleic acids are NOT DETECTED.  The SARS-CoV-2 RNA is generally detectable in upper and lower respiratory specimens during the acute phase of infection. Negative results do not preclude SARS-CoV-2 infection, do not rule out co-infections with other pathogens, and should not be used as the sole basis for treatment or other patient management decisions. Negative results must be combined with clinical observations, patient history, and epidemiological information. The expected result is Negative.  Fact Sheet for Patients: SugarRoll.be  Fact Sheet for Healthcare Providers: https://www.woods-mathews.com/  This test is not yet approved or cleared by the  [place] Architectural technologist and  has been authorized for detection and/or diagnosis of SARS-CoV-2 by FDA under an Print production planner (EUA). This EUA will remain  in effect (meaning this test can be used) for the duration of the COVID-19 declaration under Se ction 564(b)(1) of the Act, 21 U.S.C. section 360bbb-3(b)(1), unless the authorization is terminated or revoked sooner.  Performed at [facility], [place] [address], [place], [place] [address]   Culture, blood (Routine X 2) w Reflex to ID Panel     Status: None (Preliminary result)   Collection Time: 06/07/20  9:39 PM   Specimen: BLOOD RIGHT ARM  Result Value Ref Range Status   Specimen Description BLOOD RIGHT ARM  Final   Special Requests   Final    BOTTLES DRAWN AEROBIC ONLY Blood Culture results may not be optimal due to an inadequate volume of blood received in culture bottles   Culture   Final    NO GROWTH 4 DAYS Performed at [facility], [place] [address], [place], [place] [address]     Report Status PENDING  Incomplete  Culture, blood (Routine X 2) w Reflex to ID Panel     Status: None (Preliminary result)   Collection Time: 06/07/20  9:48 PM   Specimen: BLOOD LEFT HAND  Result Value Ref Range Status   Specimen Description BLOOD LEFT HAND  Final   Special Requests   Final    BOTTLES DRAWN AEROBIC ONLY Blood Culture results may not be optimal due to an inadequate volume of blood received in culture bottles   Culture   Final    NO GROWTH 4 DAYS Performed at [facility], [place] [address] [place],  [address]    Report Status PENDING  Incomplete     Radiology Studies: No results found.  Marzetta Board, MD, PhD [facility]  Between 7 am - 7 pm I am available, please contact me via Amion (for emergencies) or Securechat (non urgent messages)  Between 7 pm - 7 am I am not available, please contact night coverage MD/APP via Amion

## 2020-06-12 DIAGNOSIS — H66001 Acute suppurative otitis media without spontaneous rupture of ear drum, right ear: Secondary | ICD-10-CM

## 2020-06-12 LAB — BASIC METABOLIC PANEL
Anion gap: 6 (ref 5–15)
BUN: 20 mg/dL (ref 8–23)
CO2: 26 mmol/L (ref 22–32)
Calcium: 9.2 mg/dL (ref 8.9–10.3)
Chloride: 100 mmol/L (ref 98–111)
Creatinine, Ser: 1.08 mg/dL — ABNORMAL HIGH (ref 0.44–1.00)
GFR, Estimated: 50 mL/min — ABNORMAL LOW (ref >60)
Glucose, Bld: 121 mg/dL — ABNORMAL HIGH (ref 70–99)
Potassium: 4.5 mmol/L (ref 3.5–5.1)
Sodium: 132 mmol/L — ABNORMAL LOW (ref 135–145)

## 2020-06-12 LAB — CULTURE, BLOOD (ROUTINE X 2)
Culture: NO GROWTH
Culture: NO GROWTH

## 2020-06-12 NOTE — Progress Notes (Signed)
Occupational Therapy Treatment Patient Details Name: Amber Kennedy MRN: [id number] DOB: 01-12-1934 Today's Date: 06/12/2020    History of present illness 87 y.o. female who presented 06/07/20 with multiple complaints including facial droop, ear drainage and decreased appetite. Head CT showed No acute intracranial abnormality.  CT temporal bones showed suggested external otitis, otitis media and mastoiditis. Rt sided Bell's Palsy, hyponatremia, afib with RVR  PMH significant of recent TIA on 04/27/2020, SSS s/p pacemaker placement on 04/30/2020, A. fib, chronic hyponatremia, HTN, HLD   OT comments  Extensive conversation about fall prevention during ADL and IADL. Recommended seated showering, use of reacher rather than attempting to retrieve items from the floor or dryer, methods for carrying items on stairs. Pt receptive. Reports showering with nursing staff seated on 3 in 1 and reports assistance with transfer and drying LB. Pt is eager to go to SNF to prepare her for safe return home.   Follow Up Recommendations  SNF    Equipment Recommendations  None recommended by OT    Recommendations for Other Services      Precautions / Restrictions Precautions Precautions: Fall Restrictions Weight Bearing Restrictions: No       Mobility Bed Mobility               General bed mobility comments: pt in chair    Transfers Overall transfer level: Needs assistance Equipment used: Rolling walker (2 wheeled) Transfers: Sit to/from Stand Sit to Stand: Min guard              Balance Overall balance assessment: Needs assistance   Sitting balance-Leahy Scale: Good     Standing balance support: No upper extremity supported Standing balance-Leahy Scale: Poor Standing balance comment: dependent on RW for dynamic balance                           ADL either performed or assessed with clinical judgement   ADL Overall ADL's : Needs assistance/impaired     Grooming:  Supervision/safety;Standing Grooming Details (indicate cue type and reason): no LOB             Lower Body Dressing: Set up;Sitting/lateral leans Lower Body Dressing Details (indicate cue type and reason): able to access feet to don socks             Functional mobility during ADLs: Min guard;Rolling walker       Vision   Additional Comments: pt with poor visual acuity, glare from window exacerbates   Perception     Praxis      Cognition Arousal/Alertness: Awake/alert Behavior During Therapy: WFL for tasks assessed/performed Overall Cognitive Status: Within Functional Limits for tasks assessed                                 General Comments: educated at length in fall prevention and compensatory strategies during ADL and IADL once she returns home        Exercises     Shoulder Instructions       General Comments      Pertinent Vitals/ Pain       Pain Assessment: No/denies pain  Home Living                                          Prior  Functioning/Environment              Frequency  Min 2X/week        Progress Toward Goals  OT Goals(current goals can now be found in the care plan section)  Progress towards OT goals: Progressing toward goals  Acute Rehab OT Goals Patient Stated Goal: to return to prior level of independence; be able to play with great grandkids (often getting down on teh floor and up again) OT Goal Formulation: With patient Time For Goal Achievement: 06/23/20 Potential to Achieve Goals: Good  Plan Discharge plan needs to be updated    Co-evaluation                 AM-PAC OT "6 Clicks" Daily Activity     Outcome Measure   Help from another person eating meals?: None Help from another person taking care of personal grooming?: A Little Help from another person toileting, which includes using toliet, bedpan, or urinal?: A Little Help from another person bathing (including washing,  rinsing, drying)?: A Little Help from another person to put on and taking off regular upper body clothing?: None Help from another person to put on and taking off regular lower body clothing?: A Little 6 Click Score: 20    End of Session Equipment Utilized During Treatment: Rolling walker  OT Visit Diagnosis: Unsteadiness on feet (R26.81);Other abnormalities of gait and mobility (R26.89);Muscle weakness (generalized) (M62.81)   Activity Tolerance Patient tolerated treatment well   Patient Left in chair;with call bell/phone within reach;with chair alarm set;with family/visitor present   Nurse Communication          Time: 6606-3016 OT Time Calculation (min): 31 min  Charges: OT General Charges $OT Visit: 1 Visit OT Treatments $Self Care/Home Management : 23-37 mins  Nestor Lewandowsky, OTR/L Acute Rehabilitation Services Pager: [phone] Office: [phone]   Malka So 06/12/2020, 3:13 PM

## 2020-06-12 NOTE — TOC Progression Note (Signed)
Transition of Care [facility]) - Progression Note    Patient Details  Name: Amber Kennedy MRN: [id number] Date of Birth: 26-Nov-1933  Transition of Care [facility]) CM/SW [place], [place] Phone Number: 06/12/2020, 12:33 PM  Clinical Narrative:     [facility] accepted pt for SNF. They will be able to take pt tomorrow. CSW informed [facility] that pt is [facility] and they will need to start insurance auth.   Expected Discharge Plan: Skilled Nursing Facility Barriers to Discharge: Insurance Authorization,SNF Pending bed offer  Expected Discharge Plan and Services Expected Discharge Plan: [place] In-house Referral: Clinical Social Work   Post Acute Care Choice: [place] Living arrangements for the past 2 months: Single Family Home                                       Social Determinants of Health (SDOH) Interventions    Readmission Risk Interventions No flowsheet data found.  Emeterio Reeve, Latanya Presser, [place] Social Worker [phone]

## 2020-06-12 NOTE — Plan of Care (Signed)
Pt progressing and excited about discharge tomorrow

## 2020-06-12 NOTE — Progress Notes (Signed)
PROGRESS NOTE    Amber Kennedy  BZJ:[id number] DOB: March 02, 1933 DOA: 06/07/2020 PCP: [name], MD   Brief Narrative: This 87 year old female with history of recent TIA in April 2022, sick sinus syndrome status post pacemaker April 2022, chronic A. fib, chronic hyponatremia, hypertension, hyperlipidemia came into the hospital with severe drainage, decreased appetite, fatigue as well as facial droop.  ENT and neurology consulted She is found to have otitis media with right Bell's palsy.   Assessment & Plan:   Active Problems:   Atrial fibrillation with RVR (HCC)   Essential hypertension   Hyponatremia   Paroxysmal atrial fibrillation (HCC)   Sick sinus syndrome [facility])   Pacemaker [employer]urity MRI dual chamber pacemaker 04/30/2020   Otitis externa   Bell's palsy   Acute otitis media   Principal Problem Otitis media and externa complicated by right-sided Bell's palsy : Clinical manifestation consistent with otitis media and externa.   ENT as well as neurology consulted in the ED, appreciate input.   Given concern for bacterial vs viral infection, she was placed on antibiotics with ceftriaxone as well as Valtrex with plans for a 10-day course.   She was also placed on steroids given Bell's palsy, and ENT placed an otowick which was removed  On 5/16.   She is also on Ciprodex drops as well as eyedrops.  ENT recommends third-generation cephalosporin for total of 10 days.   Continue current regimen  Active Problems Acute on chronic hyponatremia: > Improved. Patient reports intermittent  hyponatremia in the past usually in the setting of dehydration, most recent sodium values were within normal limits last month.  She is likely hyponatremic now due to very poor p.o. intake as well as watery diarrhea for couple of days prior to admission.  Sodium on admission was 122, she was given gentle hydration and sodium has improved and has remained overall stable.  She is now watched off  fluids.  Chronic A. fib, RVR in the ED: Continue diltiazem, metoprolol, flecainide.  Rate controlled, continue Eliquis  Essential hypertension: Continue home regimen as below  Hyperlipidemia: Continue statin  Sick sinus syndrome status post pacemaker-stable  Recent TIA : on Eliquis as above, continue statin  Generalized Weakness- Patient has some dizziness possibly in the setting of ear involvement.   PT recommends CIR, she was denied by insurance, appealed yesterday but insurance continues to deny.   She is agreeable to SNF, social worker consulted, stable for discharge when bed is available   DVT prophylaxis: Eliquis Code Status: Full code. Family Communication: No family at bed side. Disposition Plan:    Status is: Inpatient  Not inpatient appropriate, will call UM team and downgrade to OBS.   Dispo: The patient is from: Home              Anticipated d/c is to: CIR              Patient currently is not medically stable to d/c.   Difficult to place patient No  Consultants:   ENT, neurology  Procedures: Antimicrobials:  Anti-infectives (From admission, onward)   Start     Dose/Rate Route Frequency Ordered Stop   06/07/20 2230  valACYclovir (VALTREX) tablet 1,000 mg        1,000 mg Oral 3 times daily 06/07/20 2140 06/14/20 2159   06/07/20 2030  cefTRIAXone (ROCEPHIN) 2 g in sodium chloride 0.9 % 100 mL IVPB        2 g 200 mL/hr over 30  Minutes Intravenous Every 24 hours 06/07/20 1841 06/16/20 2359   06/07/20 1500  valACYclovir (VALTREX) tablet 1,000 mg  Status:  Discontinued        1,000 mg Oral 3 times daily 06/07/20 1404 06/07/20 1902   06/07/20 1415  cefdinir (OMNICEF) capsule 300 mg  Status:  Discontinued        300 mg Oral Every 12 hours 06/07/20 1404 06/07/20 1920      Subjective: Patient was seen and examined at bedside.  Overnight events noted.  Patient reports feeling better,  she has visible left-sided Bell's palsy.  Objective: Vitals:    06/11/20 1647 06/11/20 2259 06/12/20 0441 06/12/20 1212  BP: (!) 145/91 (!) 134/92 (!) 157/99 (!) 173/98  Pulse: 72 96 77 85  Resp: 18 16 18 18   Temp: 97.8 F (36.6 C) 97.9 F (36.6 C) 97.6 F (36.4 C) 97.8 F (36.6 C)  TempSrc: Oral Oral Oral Oral  SpO2: 98% 97% 97% 96%  Weight:      Height:        Intake/Output Summary (Last 24 hours) at 06/12/2020 1518 Last data filed at 06/12/2020 0444 Gross per 24 hour  Intake 240 ml  Output --  Net 240 ml   Filed Weights   06/07/20 1117 06/07/20 1900  Weight: 79.8 kg 79.9 kg    Examination:  General exam: Appears calm and comfortable , not in any acute distress. Respiratory system: Clear to auscultation. Respiratory effort normal. Cardiovascular system: S1 & S2 heard, RRR. No JVD, murmurs, rubs, gallops or clicks. No pedal edema. Gastrointestinal system: Abdomen is nondistended, soft and nontender. No organomegaly or masses felt. Normal bowel sounds heard. Central nervous system: Alert and oriented. No focal neurological deficits.  Left-sided Bell's palsy. Extremities: Symmetric 5 x 5 power. Skin: No rashes, lesions or ulcers Psychiatry: Judgement and insight appear normal. Mood & affect appropriate.     Data Reviewed: I have personally reviewed following labs and imaging studies  CBC: Recent Labs  Lab 06/07/20 1104 06/08/20 0308 06/10/20 0135  WBC 9.7 7.6 9.2  NEUTROABS 6.7 6.0  --   HGB 14.8 13.2 12.8  HCT 43.1 38.2 37.8  MCV 88.0 87.6 88.9  PLT 184 170 0000000   Basic Metabolic Panel: Recent Labs  Lab 06/09/20 0356 06/09/20 1604 06/10/20 0135 06/11/20 0247 06/12/20 0115  NA 130* 131* 133* 132* 132*  K 4.1 4.5 5.1 4.4 4.5  CL 101 102 103 100 100  CO2 21* 22 24 24 26   GLUCOSE 129* 182* 121* 111* 121*  BUN 21 24* 19 21 20   CREATININE 1.07* 1.19* 1.07* 0.96 1.08*  CALCIUM 9.3 9.0 9.3 9.2 9.2   GFR: Estimated Creatinine Clearance: 41.4 mL/min (A) (by C-G formula based on SCr of 1.08 mg/dL (H)). Liver Function  Tests: Recent Labs  Lab 06/07/20 1104  AST 34  ALT 18  ALKPHOS 71  BILITOT 1.8*  PROT 7.4  ALBUMIN 3.9   No results for input(s): LIPASE, AMYLASE in the last 168 hours. No results for input(s): AMMONIA in the last 168 hours. Coagulation Profile: Recent Labs  Lab 06/07/20 2139  INR 1.1   Cardiac Enzymes: No results for input(s): CKTOTAL, CKMB, CKMBINDEX, TROPONINI in the last 168 hours. BNP (last 3 results) No results for input(s): PROBNP in the last 8760 hours. HbA1C: No results for input(s): HGBA1C in the last 72 hours. CBG: No results for input(s): GLUCAP in the last 168 hours. Lipid Profile: No results for input(s): CHOL, HDL, LDLCALC, TRIG, CHOLHDL,  LDLDIRECT in the last 72 hours. Thyroid Function Tests: No results for input(s): TSH, T4TOTAL, FREET4, T3FREE, THYROIDAB in the last 72 hours. Anemia Panel: No results for input(s): VITAMINB12, FOLATE, FERRITIN, TIBC, IRON, RETICCTPCT in the last 72 hours. Sepsis Labs: No results for input(s): PROCALCITON, LATICACIDVEN in the last 168 hours.  Recent Results (from the past 240 hour(s))  SARS CORONAVIRUS 2 (TAT 6-24 HRS) Nasopharyngeal Nasopharyngeal Swab     Status: None   Collection Time: 06/07/20  2:05 PM   Specimen: Nasopharyngeal Swab  Result Value Ref Range Status   SARS Coronavirus 2 NEGATIVE NEGATIVE Final    Comment: (NOTE) SARS-CoV-2 target nucleic acids are NOT DETECTED.  The SARS-CoV-2 RNA is generally detectable in upper and lower respiratory specimens during the acute phase of infection. Negative results do not preclude SARS-CoV-2 infection, do not rule out co-infections with other pathogens, and should not be used as the sole basis for treatment or other patient management decisions. Negative results must be combined with clinical observations, patient history, and epidemiological information. The expected result is Negative.  Fact Sheet for Patients: SugarRoll.be  Fact  Sheet for Healthcare Providers: https://www.woods-mathews.com/  This test is not yet approved or cleared by the [place] FDA and  has been authorized for detection and/or diagnosis of SARS-CoV-2 by FDA under an Emergency Use Authorization (EUA). This EUA will remain  in effect (meaning this test can be used) for the duration of the COVID-19 declaration under Se ction 564(b)(1) of the Act, 21 U.S.C. section 360bbb-3(b)(1), unless the authorization is terminated or revoked sooner.  Performed at [facility], [place] [address], [place], [place] [address]   Culture, blood (Routine X 2) w Reflex to ID Panel     Status: None   Collection Time: 06/07/20  9:39 PM   Specimen: BLOOD RIGHT ARM  Result Value Ref Range Status   Specimen Description BLOOD RIGHT ARM  Final   Special Requests   Final    BOTTLES DRAWN AEROBIC ONLY Blood Culture results may not be optimal due to an inadequate volume of blood received in culture bottles   Culture   Final    NO GROWTH 5 DAYS Performed at [facility], [place] [address], [place], [place] [address]    Report Status 06/12/2020 FINAL  Final  Culture, blood (Routine X 2) w Reflex to ID Panel     Status: None   Collection Time: 06/07/20  9:48 PM   Specimen: BLOOD LEFT HAND  Result Value Ref Range Status   Specimen Description BLOOD LEFT HAND  Final   Special Requests   Final    BOTTLES DRAWN AEROBIC ONLY Blood Culture results may not be optimal due to an inadequate volume of blood received in culture bottles   Culture   Final    NO GROWTH 5 DAYS Performed at [facility], [place] [address], [place], [place] [address]    Report Status 06/12/2020 FINAL  Final  SARS CORONAVIRUS 2 (TAT 6-24 HRS) Nasopharyngeal Nasopharyngeal Swab     Status: None   Collection Time: 06/11/20  1:57 PM   Specimen: Nasopharyngeal Swab  Result Value Ref Range Status   SARS Coronavirus 2 NEGATIVE NEGATIVE Final    Comment: (NOTE) SARS-CoV-2 target  nucleic acids are NOT DETECTED.  The SARS-CoV-2 RNA is generally detectable in upper and lower respiratory specimens during the acute phase of infection. Negative results do not preclude SARS-CoV-2 infection, do not rule out co-infections with other  pathogens, and should not be used as the sole basis for treatment or other patient management decisions. Negative results must be combined with clinical observations, patient history, and epidemiological information. The expected result is Negative.  Fact Sheet for Patients: SugarRoll.be  Fact Sheet for Healthcare Providers: https://www.woods-mathews.com/  This test is not yet approved or cleared by the [place] FDA and  has been authorized for detection and/or diagnosis of SARS-CoV-2 by FDA under an Emergency Use Authorization (EUA). This EUA will remain  in effect (meaning this test can be used) for the duration of the COVID-19 declaration under Se ction 564(b)(1) of the Act, 21 U.S.C. section 360bbb-3(b)(1), unless the authorization is terminated or revoked sooner.  Performed at [facility], [place] [address] [place], [place] [address]     Radiology Studies: No results found.  Scheduled Meds: . apixaban  5 mg Oral BID  . artificial tears  1 application Right Eye TID  . ciprofloxacin-dexamethasone  4 drop Right EAR BID  . diltiazem  180 mg Oral Daily  . flecainide  50 mg Oral Q12H  . losartan  50 mg Oral Daily  . metoprolol succinate  100 mg Oral Daily  . predniSONE  60 mg Oral Q breakfast  . rosuvastatin  20 mg Oral Daily  . valACYclovir  1,000 mg Oral TID   Continuous Infusions: . cefTRIAXone (ROCEPHIN)  IV 2 g (06/11/20 2002)     LOS: 4 days    Time spent: 35 mins  Wilmar Prabhakar, MD [facility]   If 7PM-7AM, please contact night-coverage

## 2020-06-12 NOTE — Progress Notes (Signed)
Physical Therapy Treatment Patient Details Name: Amber Kennedy MRN: [id number] DOB: 06-Jan-1934 Today's Date: 06/12/2020    History of Present Illness 86 y.o. female who presented 06/07/20 with multiple complaints including facial droop, ear drainage and decreased appetite. Head CT showed No acute intracranial abnormality.  CT temporal bones showed suggested external otitis, otitis media and mastoiditis. Rt sided Bell's Palsy, hyponatremia, afib with RVR  PMH significant of recent TIA on 04/27/2020, SSS s/p pacemaker placement on 04/30/2020, A. fib, chronic hyponatremia, HTN, HLD    PT Comments    Pt was seen for mobility on RW today, cued safety and directional changes, and practiced balance skills.  Her concern about not progressing is not necessarily founded, and in fact is more mobile and controlled with turns and transitions.  Min guard transfers, has good transition to walker with hand placement.  Her plan is to continue to recommend CIR, and hopefully is going to be approved for this intensive therapy.  If not will need SNF to increase strength and balance to safer and more independent level.  Encourage walking with staff as much as time permits.   Follow Up Recommendations  CIR     Equipment Recommendations       Recommendations for Other Services OT consult;Rehab consult     Precautions / Restrictions Precautions Precautions: Fall Restrictions Weight Bearing Restrictions: No    Mobility  Bed Mobility               General bed mobility comments: pt in chair    Transfers Overall transfer level: Needs assistance Equipment used: Rolling walker (2 wheeled) Transfers: Sit to/from Stand Sit to Stand: Min guard            Ambulation/Gait Ambulation/Gait assistance: Min guard Gait Distance (Feet): 50 Feet (30+20) Assistive device: Rolling walker (2 wheeled) Gait Pattern/deviations: Step-through pattern;Decreased stride length;Wide base of support Gait velocity:  controlled but reasonable   General Gait Details: moving with more ease   Stairs             Wheelchair Mobility    Modified Rankin (Stroke Patients Only)       Balance Overall balance assessment: Needs assistance   Sitting balance-Leahy Scale: Good     Standing balance support: No upper extremity supported Standing balance-Leahy Scale: Poor Standing balance comment: dependent on RW for dynamic balance                            Cognition Arousal/Alertness: Awake/alert Behavior During Therapy: WFL for tasks assessed/performed Overall Cognitive Status: Within Functional Limits for tasks assessed                                 General Comments: discussed recovery process and balance skills currently      Exercises      General Comments General comments (skin integrity, edema, etc.): Pt is up to walk with sidesteps, back steps and turning in confined space      Pertinent Vitals/Pain Pain Assessment: No/denies pain    Home Living                      Prior Function            PT Goals (current goals can now be found in the care plan section) Acute Rehab PT Goals Patient Stated Goal: get home and walk independently  Progress towards PT goals: Progressing toward goals    Frequency    Min 3X/week      PT Plan Current plan remains appropriate    Co-evaluation              AM-PAC PT "6 Clicks" Mobility   Outcome Measure  Help needed turning from your back to your side while in a flat bed without using bedrails?: None Help needed moving from lying on your back to sitting on the side of a flat bed without using bedrails?: A Little Help needed moving to and from a bed to a chair (including a wheelchair)?: A Little Help needed standing up from a chair using your arms (e.g., wheelchair or bedside chair)?: A Little Help needed to walk in hospital room?: A Little Help needed climbing 3-5 steps with a railing? : A  Little 6 Click Score: 19    End of Session Equipment Utilized During Treatment: Gait belt Activity Tolerance: Patient tolerated treatment well Patient left: in chair;with call bell/phone within reach;Other (comment) Nurse Communication: Mobility status PT Visit Diagnosis: Unsteadiness on feet (R26.81);Difficulty in walking, not elsewhere classified (R26.2)     Time: 1324-4010 PT Time Calculation (min) (ACUTE ONLY): 33 min  Charges:  $Gait Training: 8-22 mins $Therapeutic Activity: 8-22 mins                   Ramond Dial 06/12/2020, 3:56 PM  Mee Hives, PT MS Acute Rehab Dept. Number: [place] [facility] [place]

## 2020-06-13 LAB — BASIC METABOLIC PANEL
Anion gap: 6 (ref 5–15)
BUN: 22 mg/dL (ref 8–23)
CO2: 27 mmol/L (ref 22–32)
Calcium: 9.1 mg/dL (ref 8.9–10.3)
Chloride: 97 mmol/L — ABNORMAL LOW (ref 98–111)
Creatinine, Ser: 0.99 mg/dL (ref 0.44–1.00)
GFR, Estimated: 56 mL/min — ABNORMAL LOW (ref >60)
Glucose, Bld: 117 mg/dL — ABNORMAL HIGH (ref 70–99)
Potassium: 4.6 mmol/L (ref 3.5–5.1)
Sodium: 130 mmol/L — ABNORMAL LOW (ref 135–145)

## 2020-06-13 LAB — RESP PANEL BY RT-PCR (FLU A&B, COVID) ARPGX2
Influenza A by PCR: NEGATIVE
Influenza B by PCR: NEGATIVE
SARS Coronavirus 2 by RT PCR: NEGATIVE

## 2020-06-13 MED ORDER — VALACYCLOVIR HCL 1 G PO TABS: 1000.0000 mg | ORAL_TABLET | Freq: Three times a day (TID) | ORAL | 0 refills | Status: DC

## 2020-06-13 MED ORDER — CLONIDINE HCL 0.2 MG PO TABS
0.2000 mg | ORAL_TABLET | Freq: Once | ORAL | Status: AC
  Administered 2020-06-13: 0.2 mg via ORAL
  Filled 2020-06-13: qty 1

## 2020-06-13 MED ORDER — PREDNISONE 20 MG PO TABS: 60.0000 mg | ORAL_TABLET | Freq: Every day | ORAL | 0 refills | Status: DC

## 2020-06-13 MED ORDER — CEFDINIR 300 MG PO CAPS: 300.0000 mg | ORAL_CAPSULE | Freq: Two times a day (BID) | ORAL | 0 refills | Status: DC

## 2020-06-13 MED ORDER — ARTIFICIAL TEARS OPHTHALMIC OINT: 1.0000 "application " | TOPICAL_OINTMENT | Freq: Three times a day (TID) | OPHTHALMIC | 0 refills | Status: AC

## 2020-06-13 NOTE — Discharge Summary (Addendum)
Physician Discharge Summary  Amber OAXACA Kennedy DOB: 10/21/1933 DOA: 06/07/2020  PCP: [name], MD  Admit date: 06/07/2020   Discharge date:06/14/20 Admitted From: Home.  Disposition:  SNF.  Recommendations for Outpatient Follow-up:  1. Follow up with PCP in 1-2 weeks. 2. Please obtain BMP/CBC in one week. 3. Advised to take Omnicef 300 mg twice a day for 3 more days to complete 10-day treatment. 4. Advised to take prednisone 60 mg daily for 3 more days to complete 10-day treatment. 5. Advised to take valacyclovir 1000 mg 3 times daily for 1 more day for Bell's palsy. 6. Advised to follow-up with ENT as scheduled.  Home Health: None.  Equipment/Devices: None  Discharge Condition: Stable CODE STATUS:Full code Diet recommendation: Heart Healthy   Brief Summary / Hospital Course: This 87 year old female with history of recent TIA in April 2022, sick sinus syndrome, status post pacemaker April 2022, chronic A. fib, chronic hyponatremia, hypertension, hyperlipidemia came into the hospital with severe drainage, decreased appetite, fatigue as well as facial droop. ENT and Neurology consulted.  She is found to have otitis media with right Bell's palsy.  Patient was admitted for acute otitis media and externa complicated by right-sided facial palsy.  Patient was seen by ENT,  recommended ceftriaxone for 10 days, Valtrex for 7 days and prednisone for 10 days.  ENT also placed an otowick which was later removed.  Patient was also given Ciprodex drops.  Patient feels improved and wants to be discharged.  PT recommended skilled nursing facility.  Patient is being discharged to skilled nursing facility for rehab.  She was managed for below problems.  Discharge Diagnoses:  Active Problems:   Atrial fibrillation with RVR (HCC)   Essential hypertension   Hyponatremia   Paroxysmal atrial fibrillation (HCC)   Sick sinus syndrome [facility])   Pacemaker [employer] Assurity MRI dual chamber  pacemaker 04/30/2020   Otitis externa   Bell's palsy   Acute otitis media   Principal Problem: Otitis media and externa complicated by right-sided Bell's palsy: Clinical manifestation consistent with otitis media and externa.  ENT as well as neurology consulted in the ED, appreciate input.  Given concern for bacterial vs viral infection, she was placed on antibiotics with ceftriaxone as well as Valtrex with plans for a 10-day course.  She was also placed on steroids given Bell's palsy, and ENT placed an otowick which was removed  On 5/16.  She is also on Ciprodex drops as well as eyedrops. ENT recommends third-generation cephalosporin for total of 10 days.    Active Problems Acute on chronic hyponatremia: > Improved. Patient reports intermittent  hyponatremia in the past usually in the setting of dehydration, most recent sodium values were within normal limits last month. She is likely hyponatremic now due to very poor p.o. intake as well as watery diarrhea for couple of days prior to admission. Sodium on admission was 122, she was given gentle hydration and sodium has improved and has remained overall stable. She is now watched off fluids.  Chronic A. fib, RVR in the ED: Continue diltiazem, metoprolol, flecainide. Rate controlled, continue Eliquis  Essential hypertension: Continue home regimen as below  Hyperlipidemia: Continue statin  Sick sinus syndrome status post pacemaker-stable  Recent TIA : on Eliquis as above, continue statin  Generalized Weakness- Patient has some dizziness possibly in the setting of ear involvement.  PT recommends CIR, she was denied by insurance, appealed yesterday but insurance continues to deny.  She is agreeable to SNF,  social worker consulted, stable for discharge when bed is available  Discharge Instructions  Discharge Instructions    Call MD for:  difficulty breathing, headache or visual disturbances   Complete by: As  directed    Call MD for:  persistant dizziness or light-headedness   Complete by: As directed    Call MD for:  persistant nausea and vomiting   Complete by: As directed    Diet - low sodium heart healthy   Complete by: As directed    Diet Carb Modified   Complete by: As directed    Discharge instructions   Complete by: As directed    Advised to follow-up with primary care physician in 1 week. Advised to take Omnicef 300 mg twice a day for 3 more days to complete 10-day treatment. Advised to take prednisone 60 mg daily for 3 more days to complete 10-day treatment. Advised to take valacyclovir 1000 mg 3 times daily for 1 more day for Bell's palsy. Advised to follow-up with ENT as scheduled.   Increase activity slowly   Complete by: As directed      Allergies as of 06/13/2020      Reactions   Allopurinol Other (See Comments)   "Made me feel badly, so I stopped taking it"   Amlodipine Besylate Swelling, Other (See Comments)   Edema (legs)   Penicillins Hives, Other (See Comments)   "The reaction happened a long time ago. I'm willing to take it now, if necessary."   Albuterol Anxiety, Other (See Comments)   Made the patient jittery   Latex Rash, Other (See Comments)   Allergic to Band-Aids   Penicillin G Hives, Other (See Comments)   "The reaction happened a long time ago. I'm willing to take it now, if necessary."      Medication List    TAKE these medications   apixaban 5 MG Tabs tablet Commonly known as: ELIQUIS Take 1 tablet (5 mg total) by mouth 2 (two) times daily.   artificial tears Oint ophthalmic ointment Commonly known as: LACRILUBE Place 1 application into the right eye 3 (three) times daily.   cefdinir 300 MG capsule Commonly known as: OMNICEF Take 1 capsule (300 mg total) by mouth 2 (two) times daily.   diltiazem 180 MG 24 hr capsule Commonly known as: CARDIZEM CD Take 1 capsule (180 mg total) by mouth daily.   flecainide 50 MG tablet Commonly known as:  TAMBOCOR Take 1 tablet (50 mg total) by mouth every 12 (twelve) hours.   losartan 50 MG tablet Commonly known as: COZAAR Take 1 tablet (50 mg total) by mouth daily.   metoprolol succinate 100 MG 24 hr tablet Commonly known as: TOPROL-XL Take 1 tablet (100 mg total) by mouth daily.   One-A-Day Womens Formula Tabs Take 1 tablet by mouth daily with breakfast.   polyethylene glycol 17 g packet Commonly known as: MIRALAX / GLYCOLAX Take 17 g by mouth daily as needed.   predniSONE 20 MG tablet Commonly known as: DELTASONE Take 3 tablets (60 mg total) by mouth daily with breakfast. Start taking on: Jun 14, 2020   rosuvastatin 20 MG tablet Commonly known as: Crestor Take 1 tablet (20 mg total) by mouth daily.   senna-docusate 8.6-50 MG tablet Commonly known as: Senokot-S Take 1 tablet by mouth at bedtime as needed for mild constipation.   valACYclovir 1000 MG tablet Commonly known as: VALTREX Take 1 tablet (1,000 mg total) by mouth 3 (three) times daily.   vitamin C  500 MG tablet Commonly known as: ASCORBIC ACID Take 500 mg by mouth daily as needed (for supplementation).       Follow-up Information    Melida Quitter, MD. Schedule an appointment as soon as possible for a visit on 06/16/2020.   Specialty: Otolaryngology Contact information: [address] [place] [address] [phone]        [name], MD Follow up in 1 week(s).   Specialty: Family Medicine Contact information: [address] [place] [address] [phone]        Jerline Pain, MD .   Specialty: Cardiology Contact information: [phone] [place] [address] [phone]              Allergies  Allergen Reactions  . Allopurinol Other (See Comments)    "Made me feel badly, so I stopped taking it"  . Amlodipine Besylate Swelling and Other (See Comments)    Edema (legs)  . Penicillins Hives and Other (See Comments)     "The reaction happened a long time ago. I'm willing to take it now, if necessary."  . Albuterol Anxiety and Other (See Comments)    Made the patient jittery  . Latex Rash and Other (See Comments)    Allergic to Band-Aids  . Penicillin G Hives and Other (See Comments)    "The reaction happened a long time ago. I'm willing to take it now, if necessary."    Consultations:  Neurology  ENT   Procedures/Studies: CT Head Wo Contrast  Result Date: 06/07/2020 CLINICAL DATA:  87 year old presenting with a possible RIGHT facial droop, RIGHT facial numbness and slurred speech. Personal history of a prior TIA. EXAM: CT HEAD WITHOUT CONTRAST TECHNIQUE: Contiguous axial images were obtained from the base of the skull through the vertex without intravenous contrast. COMPARISON:  04/27/2020 CT head and MRI brain. FINDINGS: Brain: Ventricular system normal in size and appearance for age. Mild moderate age-appropriate cortical atrophy. Mild to moderate changes of small vessel disease of the white matter diffusely. No mass lesion. No midline shift. No acute hemorrhage or hematoma. No extra-axial fluid collections. No evidence of acute infarction. Vascular: Severe BILATERAL carotid siphon and mild BILATERAL vertebral artery atherosclerosis. No hyperdense vessel. Skull: No skull fracture or other focal osseous abnormality involving the skull. Sinuses/Orbits: Opacification of scattered ethmoid air cells bilaterally, slightly increased since the prior CT. Remaining visualized paranasal sinuses, BILATERAL mastoid air cells and BILATERAL middle ear cavities well-aerated. Other: None. IMPRESSION: 1. No acute intracranial abnormality. 2. Mild to moderate age appropriate cortical atrophy and mild-to-moderate chronic microvascular ischemic changes of the white matter. 3. Mild BILATERAL ethmoid sinus disease. Electronically Signed   By: Evangeline Dakin M.D.   On: 06/07/2020 12:56   CT Temporal Bones Wo Contrast  Result  Date: 06/07/2020 CLINICAL DATA:  Neoplasm, auditory canal (internal or external). Right ear infection, concern for malignant external otitis, concern for bony erosion/bony involvement. EXAM: CT TEMPORAL BONES WITHOUT CONTRAST TECHNIQUE: Axial and coronal plane CT imaging of the petrous temporal bones was performed with thin-collimation image reconstruction. No intravenous contrast was administered. Multiplanar CT image reconstructions were also generated. COMPARISON:  No pertinent prior exam. FINDINGS: RIGHT TEMPORAL BONE External auditory canal: Extensive partial opacification of the external auditory canal. Associated soft tissue thickening of the external auditory canal. No definite bony erosion of the external auditory canal is appreciated. Middle ear cavity: Extensive partial opacification of the middle ear cavity. No definite ossicular erosion  is appreciated. The scutum remain sharp. Inner ear structures: The cochlea, vestibule and semicircular canals are normal. The vestibular aqueduct is not enlarged. Internal auditory and facial nerve canals:  Normal Mastoid air cells: Moderate partial opacification of the mastoid air cells without appreciable coalescent osseous erosion. LEFT TEMPORAL BONE External auditory canal: Nonspecific mild partial opacification of the external auditory canal. Middle ear cavity: Normally aerated. The ossicles are unremarkable. The scutum is sharp. Inner ear structures: The cochlea, vestibule and semicircular canals are normal. The vestibular aqueduct is not enlarged. Internal auditory and facial nerve canals:  Normal. Mastoid air cells: Small left mastoid effusion without appreciable coalescent osseous erosion. Limited intracranial:  Reported separately Visible orbits/paranasal sinuses: No acute or significant orbital finding. Small right maxillary sinus mucous retention cyst. IMPRESSION: On the right, there is extensive partial opacification of the right external auditory canal and  associated soft tissue thickening along the external auditory canal. There is also extensive partial opacification of the middle ear cavity and a moderate right mastoid effusion. No appreciable osseous erosion. Findings are nonspecific and may reflect external otitis, otitis media and mastoiditis. Neoplasm is difficult to definitively exclude. Correlate with findings on otoscopic examination. On the left, there is mild nonspecific partial opacification of the external auditory canal. A small left mastoid effusion is also present. No appreciable osseous erosion. Correlate with findings on other otoscopic examination. Electronically Signed   By: Kellie Simmering DO   On: 06/07/2020 13:11     Subjective: Patient was seen and examined at bedside.  Overnight events noted.  Patient reports feeling better, patient wants to be discharged.  Discharge Exam: Vitals:   06/13/20 0658 06/13/20 1149  BP: 123/79 124/79  Pulse:  95  Resp:  18  Temp:  97.9 F (36.6 C)  SpO2:  95%   Vitals:   06/13/20 0503 06/13/20 0530 06/13/20 0658 06/13/20 1149  BP: (!) 157/110 (!) 167/110 123/79 124/79  Pulse: 70 99  95  Resp: 18   18  Temp: 97.7 F (36.5 C)   97.9 F (36.6 C)  TempSrc: Oral   Oral  SpO2: 97%   95%  Weight:      Height:        General: Pt is alert, awake, not in acute distress, right-sided facial palsy Cardiovascular: RRR, S1/S2 +, no rubs, no gallops Respiratory: CTA bilaterally, no wheezing, no rhonchi Abdominal: Soft, NT, ND, bowel sounds + Extremities: no edema, no cyanosis    The results of significant diagnostics from this hospitalization (including imaging, microbiology, ancillary and laboratory) are listed below for reference.     Microbiology: Recent Results (from the past 240 hour(s))  SARS CORONAVIRUS 2 (TAT 6-24 HRS) Nasopharyngeal Nasopharyngeal Swab     Status: None   Collection Time: 06/07/20  2:05 PM   Specimen: Nasopharyngeal Swab  Result Value Ref Range Status   SARS  Coronavirus 2 NEGATIVE NEGATIVE Final    Comment: (NOTE) SARS-CoV-2 target nucleic acids are NOT DETECTED.  The SARS-CoV-2 RNA is generally detectable in upper and lower respiratory specimens during the acute phase of infection. Negative results do not preclude SARS-CoV-2 infection, do not rule out co-infections with other pathogens, and should not be used as the sole basis for treatment or other patient management decisions. Negative results must be combined with clinical observations, patient history, and epidemiological information. The expected result is Negative.  Fact Sheet for Patients: SugarRoll.be  Fact Sheet for Healthcare Providers: https://www.woods-Nimrit Kehres.com/  This test is not yet approved or  cleared by the [place] and  has been authorized for detection and/or diagnosis of SARS-CoV-2 by FDA under an Emergency Use Authorization (EUA). This EUA will remain  in effect (meaning this test can be used) for the duration of the COVID-19 declaration under Se ction 564(b)(1) of the Act, 21 U.S.C. section 360bbb-3(b)(1), unless the authorization is terminated or revoked sooner.  Performed at [facility], [place] [address], [place], [place] [address]   Culture, blood (Routine X 2) w Reflex to ID Panel     Status: None   Collection Time: 06/07/20  9:39 PM   Specimen: BLOOD RIGHT ARM  Result Value Ref Range Status   Specimen Description BLOOD RIGHT ARM  Final   Special Requests   Final    BOTTLES DRAWN AEROBIC ONLY Blood Culture results may not be optimal due to an inadequate volume of blood received in culture bottles   Culture   Final    NO GROWTH 5 DAYS Performed at [facility], [place] [address] [place], [place] [address]    Report Status 06/12/2020 FINAL  Final  Culture, blood (Routine X 2) w Reflex to ID Panel     Status: None   Collection Time: 06/07/20  9:48 PM   Specimen: BLOOD LEFT HAND  Result Value  Ref Range Status   Specimen Description BLOOD LEFT HAND  Final   Special Requests   Final    BOTTLES DRAWN AEROBIC ONLY Blood Culture results may not be optimal due to an inadequate volume of blood received in culture bottles   Culture   Final    NO GROWTH 5 DAYS Performed at [facility], [place] [address] [place], [place] [address]    Report Status 06/12/2020 FINAL  Final  SARS CORONAVIRUS 2 (TAT 6-24 HRS) Nasopharyngeal Nasopharyngeal Swab     Status: None   Collection Time: 06/11/20  1:57 PM   Specimen: Nasopharyngeal Swab  Result Value Ref Range Status   SARS Coronavirus 2 NEGATIVE NEGATIVE Final    Comment: (NOTE) SARS-CoV-2 target nucleic acids are NOT DETECTED.  The SARS-CoV-2 RNA is generally detectable in upper and lower respiratory specimens during the acute phase of infection. Negative results do not preclude SARS-CoV-2 infection, do not rule out co-infections with other pathogens, and should not be used as the sole basis for treatment or other patient management decisions. Negative results must be combined with clinical observations, patient history, and epidemiological information. The expected result is Negative.  Fact Sheet for Patients: SugarRoll.be  Fact Sheet for Healthcare Providers: https://www.woods-Benjiman [employer]gwick.com/  This test is not yet approved or cleared by the [place] FDA and  has been authorized for detection and/or diagnosis of SARS-CoV-2 by FDA under an Emergency Use Authorization (EUA). This EUA will remain  in effect (meaning this test can be used) for the duration of the COVID-19 declaration under Se ction 564(b)(1) of the Act, 21 U.S.C. section 360bbb-3(b)(1), unless the authorization is terminated or revoked sooner.  Performed at [facility], [place] [address] [place], [place] [address]      Labs: BNP (last 3 results) No results for input(s): BNP in the last 8760 hours. Basic Metabolic  Panel: Recent Labs  Lab 06/09/20 1604 06/10/20 0135 06/11/20 0247 06/12/20 0115 06/13/20 0202  NA 131* 133* 132* 132* 130*  K 4.5 5.1 4.4 4.5 4.6  CL 102 103 100 100 97*  CO2 22 24 24 26 27   GLUCOSE 182* 121* 111* 121*  117*  BUN 24* 19 21 20 22   CREATININE 1.19* 1.07* 0.96 1.08* 0.99  CALCIUM 9.0 9.3 9.2 9.2 9.1   Liver Function Tests: Recent Labs  Lab 06/07/20 1104  AST 34  ALT 18  ALKPHOS 71  BILITOT 1.8*  PROT 7.4  ALBUMIN 3.9   No results for input(s): LIPASE, AMYLASE in the last 168 hours. No results for input(s): AMMONIA in the last 168 hours. CBC: Recent Labs  Lab 06/07/20 1104 06/08/20 0308 06/10/20 0135  WBC 9.7 7.6 9.2  NEUTROABS 6.7 6.0  --   HGB 14.8 13.2 12.8  HCT 43.1 38.2 37.8  MCV 88.0 87.6 88.9  PLT 184 170 188   Cardiac Enzymes: No results for input(s): CKTOTAL, CKMB, CKMBINDEX, TROPONINI in the last 168 hours. BNP: Invalid input(s): POCBNP CBG: No results for input(s): GLUCAP in the last 168 hours. D-Dimer No results for input(s): DDIMER in the last 72 hours. Hgb A1c No results for input(s): HGBA1C in the last 72 hours. Lipid Profile No results for input(s): CHOL, HDL, LDLCALC, TRIG, CHOLHDL, LDLDIRECT in the last 72 hours. Thyroid function studies No results for input(s): TSH, T4TOTAL, T3FREE, THYROIDAB in the last 72 hours.  Invalid input(s): FREET3 Anemia work up No results for input(s): VITAMINB12, FOLATE, FERRITIN, TIBC, IRON, RETICCTPCT in the last 72 hours. Urinalysis    Component Value Date/Time   COLORURINE YELLOW 06/07/2020 [place] 06/07/2020 1306   LABSPEC 1.008 06/07/2020 1306   PHURINE 6.0 06/07/2020 1306   GLUCOSEU NEGATIVE 06/07/2020 1306   [place] 06/07/2020 [place] 06/07/2020 1306   [place] 06/07/2020 [place] 06/07/2020 1306   NITRITE NEGATIVE 06/07/2020 1306   LEUKOCYTESUR NEGATIVE 06/07/2020 1306   Sepsis Labs Invalid input(s):  PROCALCITONIN,  WBC,  LACTICIDVEN Microbiology Recent Results (from the past 240 hour(s))  SARS CORONAVIRUS 2 (TAT 6-24 HRS) Nasopharyngeal Nasopharyngeal Swab     Status: None   Collection Time: 06/07/20  2:05 PM   Specimen: Nasopharyngeal Swab  Result Value Ref Range Status   SARS Coronavirus 2 NEGATIVE NEGATIVE Final    Comment: (NOTE) SARS-CoV-2 target nucleic acids are NOT DETECTED.  The SARS-CoV-2 RNA is generally detectable in upper and lower respiratory specimens during the acute phase of infection. Negative results do not preclude SARS-CoV-2 infection, do not rule out co-infections with other pathogens, and should not be used as the sole basis for treatment or other patient management decisions. Negative results must be combined with clinical observations, patient history, and epidemiological information. The expected result is Negative.  Fact Sheet for Patients: SugarRoll.be  Fact Sheet for Healthcare Providers: https://www.woods-Debbora Ang.com/  This test is not yet approved or cleared by the [place] FDA and  has been authorized for detection and/or diagnosis of SARS-CoV-2 by FDA under an Emergency Use Authorization (EUA). This EUA will remain  in effect (meaning this test can be used) for the duration of the COVID-19 declaration under Se ction 564(b)(1) of the Act, 21 U.S.C. section 360bbb-3(b)(1), unless the authorization is terminated or revoked sooner.  Performed at [facility], [place] [address], [place], [place] [address]   Culture, blood (Routine X 2) w Reflex to ID Panel     Status: None   Collection Time: 06/07/20  9:39 PM   Specimen: BLOOD RIGHT ARM  Result Value Ref Range Status   Specimen Description BLOOD RIGHT ARM  Final   Special Requests   Final    BOTTLES  DRAWN AEROBIC ONLY Blood Culture results may not be optimal due to an inadequate volume of blood received in culture bottles   Culture   Final     NO GROWTH 5 DAYS Performed at [facility], [place] [address], [place], [place] [address]    Report Status 06/12/2020 FINAL  Final  Culture, blood (Routine X 2) w Reflex to ID Panel     Status: None   Collection Time: 06/07/20  9:48 PM   Specimen: BLOOD LEFT HAND  Result Value Ref Range Status   Specimen Description BLOOD LEFT HAND  Final   Special Requests   Final    BOTTLES DRAWN AEROBIC ONLY Blood Culture results may not be optimal due to an inadequate volume of blood received in culture bottles   Culture   Final    NO GROWTH 5 DAYS Performed at [facility], [place] [address], [place], [place] [address]    Report Status 06/12/2020 FINAL  Final  SARS CORONAVIRUS 2 (TAT 6-24 HRS) Nasopharyngeal Nasopharyngeal Swab     Status: None   Collection Time: 06/11/20  1:57 PM   Specimen: Nasopharyngeal Swab  Result Value Ref Range Status   SARS Coronavirus 2 NEGATIVE NEGATIVE Final    Comment: (NOTE) SARS-CoV-2 target nucleic acids are NOT DETECTED.  The SARS-CoV-2 RNA is generally detectable in upper and lower respiratory specimens during the acute phase of infection. Negative results do not preclude SARS-CoV-2 infection, do not rule out co-infections with other pathogens, and should not be used as the sole basis for treatment or other patient management decisions. Negative results must be combined with clinical observations, patient history, and epidemiological information. The expected result is Negative.  Fact Sheet for Patients: SugarRoll.be  Fact Sheet for Healthcare Providers: https://www.woods-Waynette [employer]ers.com/  This test is not yet approved or cleared by the [place] FDA and  has been authorized for detection and/or diagnosis of SARS-CoV-2 by FDA under an Emergency Use Authorization (EUA). This EUA will remain  in effect (meaning this test can be used) for the duration of the COVID-19 declaration under Se ction 564(b)(1) of  the Act, 21 U.S.C. section 360bbb-3(b)(1), unless the authorization is terminated or revoked sooner.  Performed at [facility], [place] [address], [place], [place] [address]      Time coordinating discharge: Over 30 minutes  SIGNED:   Shawna Clamp, MD  [facility] 06/13/2020, 2:22 PM Pager   If 7PM-7AM, please contact night-coverage [email or web address]

## 2020-06-13 NOTE — Discharge Instructions (Signed)
Advised to follow-up with primary care physician in 1 week. Advised to take Omnicef 300 mg twice a day for 3 more days to complete 10-day treatment. Advised to take prednisone 60 mg daily for 3 more days to complete 10-day treatment. Advised to take valacyclovir 1000 mg 3 times daily for 1 more day for Bell's palsy. Advised to follow-up with ENT as scheduled.

## 2020-06-13 NOTE — Progress Notes (Signed)
Physical Therapy Treatment Patient Details Name: Amber Kennedy MRN: [id number] DOB: August 06, 1933 Today's Date: 06/13/2020    History of Present Illness 87 y.o. female who presented 06/07/20 with multiple complaints including facial droop, ear drainage and decreased appetite. Head CT showed No acute intracranial abnormality.  CT temporal bones showed suggested external otitis, otitis media and mastoiditis. Rt sided Bell's Palsy, hyponatremia, afib with RVR  PMH significant of recent TIA on 04/27/2020, SSS s/p pacemaker placement on 04/30/2020, A. fib, chronic hyponatremia, HTN, HLD    PT Comments    The pt was agreeable to participate with therapies due to strong motivation to recover and eventually return home. The pt was able to complete sit-stand and hallway ambulation with minG and use of RW for safety. No overt LOB at this time, but reports significantly increased fatigue requiring return to seated rest. The pt was challenged by addition of walking marches and short intervals of increased speed, will continue to benefit from skilled PT to address dynamic stability, endurance, and safety awareness prior to eventual return home.    Follow Up Recommendations  SNF;Supervision/Assistance - 24 hour     Equipment Recommendations  Rolling walker with 5" wheels    Recommendations for Other Services       Precautions / Restrictions Precautions Precautions: Fall Precaution Comments: Very fearful of falling Restrictions Weight Bearing Restrictions: No    Mobility  Bed Mobility Overal bed mobility: Modified Independent Bed Mobility: Supine to Sit           General bed mobility comments: pt able to come to sitting EOB without any assist or cues, HOB slightly elevated, but pt could complete from flat surfaceq    Transfers Overall transfer level: Needs assistance Equipment used: Rolling walker (2 wheeled) Transfers: Sit to/from Stand Sit to Stand: Min guard         General transfer  comment: minG to power up with increased time to complete, no overt LOB with stand, BUE supported on RW. completed x2 in session  Ambulation/Gait Ambulation/Gait assistance: Min guard Gait Distance (Feet): 75 Feet Assistive device: Rolling walker (2 wheeled) Gait Pattern/deviations: Step-to pattern;Step-through pattern;Decreased stride length;Trunk flexed Gait velocity: 0.05 m/s Gait velocity interpretation: <1.31 ft/sec, indicative of household ambulator General Gait Details: slow but steady, pt prefering step-to pattern but able to progress to step-through with verbal cues or minA to RW to increase speed of RW movement. No overt LOB but slowed movements with turning         Balance Overall balance assessment: Needs assistance Sitting-balance support: No upper extremity supported;Feet supported Sitting balance-Leahy Scale: Good     Standing balance support: Bilateral upper extremity supported Standing balance-Leahy Scale: Poor Standing balance comment: reliant on BUE support               High Level Balance Comments: changes in walking speed, tolerated well with continued verbal cues            Cognition Arousal/Alertness: Awake/alert Behavior During Therapy: WFL for tasks assessed/performed Overall Cognitive Status: Within Functional Limits for tasks assessed                                 General Comments: Slightly decreased insight to deficits and need for continued rehab prior to return to full independence, but agreeable to education and highly motivated.      Exercises Other Exercises Other Exercises: walking marches, x5 each LE, BUE support  maintained on RW    General Comments General comments (skin integrity, edema, etc.): VSS on RA      Pertinent Vitals/Pain Pain Assessment: No/denies pain           PT Goals (current goals can now be found in the care plan section) Acute Rehab PT Goals Patient Stated Goal: get home and walk  independently PT Goal Formulation: With patient Time For Goal Achievement: 06/22/20 Potential to Achieve Goals: Good Progress towards PT goals: Progressing toward goals    Frequency    Min 3X/week      PT Plan Current plan remains appropriate       AM-PAC PT "6 Clicks" Mobility   Outcome Measure  Help needed turning from your back to your side while in a flat bed without using bedrails?: None Help needed moving from lying on your back to sitting on the side of a flat bed without using bedrails?: None Help needed moving to and from a bed to a chair (including a wheelchair)?: A Little Help needed standing up from a chair using your arms (e.g., wheelchair or bedside chair)?: A Little Help needed to walk in hospital room?: A Little Help needed climbing 3-5 steps with a railing? : A Lot 6 Click Score: 19    End of Session Equipment Utilized During Treatment: Gait belt Activity Tolerance: Patient tolerated treatment well Patient left: in chair;with call bell/phone within reach;with chair alarm set;with family/visitor present Nurse Communication: Mobility status PT Visit Diagnosis: Unsteadiness on feet (R26.81);Difficulty in walking, not elsewhere classified (R26.2)     Time: 6073-7106 PT Time Calculation (min) (ACUTE ONLY): 17 min  Charges:  $Gait Training: 8-22 mins                     Karma Ganja, PT, DPT   Acute Rehabilitation Department Pager #: [phone]   Otho Bellows 06/13/2020, 2:35 PM

## 2020-06-13 NOTE — TOC Progression Note (Signed)
Transition of Care [facility]) - Progression Note    Patient Details  Name: Amber Kennedy MRN: [id number] Date of Birth: 12/18/1933  Transition of Care [facility]) CM/SW [place], [place] Phone Number: 06/13/2020, 4:37 PM  Clinical Narrative:     [facility] has received insurance auth for pt. They can accept pt tomorrow morning. CSW requested new covid test. Pt will go to room 210 at [facility].  Expected Discharge Plan: Skilled Nursing Facility Barriers to Discharge: Insurance Authorization,SNF Pending bed offer  Expected Discharge Plan and Services Expected Discharge Plan: [place] In-house Referral: Clinical Social Work   Post Acute Care Choice: [place] Living arrangements for the past 2 months: Single Family Home Expected Discharge Date: 06/13/20                                     Social Determinants of Health (SDOH) Interventions    Readmission Risk Interventions No flowsheet data found.  Emeterio Reeve, Latanya Presser, [place] Social Worker [phone]

## 2020-06-14 DIAGNOSIS — E871 Hypo-osmolality and hyponatremia: Secondary | ICD-10-CM | POA: Diagnosis not present

## 2020-06-14 DIAGNOSIS — E785 Hyperlipidemia, unspecified: Secondary | ICD-10-CM | POA: Diagnosis not present

## 2020-06-14 DIAGNOSIS — H70001 Acute mastoiditis without complications, right ear: Secondary | ICD-10-CM | POA: Diagnosis not present

## 2020-06-14 DIAGNOSIS — R2681 Unsteadiness on feet: Secondary | ICD-10-CM | POA: Diagnosis not present

## 2020-06-14 DIAGNOSIS — H6690 Otitis media, unspecified, unspecified ear: Secondary | ICD-10-CM | POA: Diagnosis not present

## 2020-06-14 DIAGNOSIS — R531 Weakness: Secondary | ICD-10-CM | POA: Diagnosis not present

## 2020-06-14 DIAGNOSIS — I495 Sick sinus syndrome: Secondary | ICD-10-CM | POA: Diagnosis not present

## 2020-06-14 DIAGNOSIS — R5381 Other malaise: Secondary | ICD-10-CM | POA: Diagnosis not present

## 2020-06-14 DIAGNOSIS — H6091 Unspecified otitis externa, right ear: Secondary | ICD-10-CM | POA: Diagnosis not present

## 2020-06-14 DIAGNOSIS — Z950 Presence of cardiac pacemaker: Secondary | ICD-10-CM | POA: Diagnosis not present

## 2020-06-14 DIAGNOSIS — R262 Difficulty in walking, not elsewhere classified: Secondary | ICD-10-CM | POA: Diagnosis not present

## 2020-06-14 DIAGNOSIS — Z79899 Other long term (current) drug therapy: Secondary | ICD-10-CM | POA: Diagnosis not present

## 2020-06-14 DIAGNOSIS — I4891 Unspecified atrial fibrillation: Secondary | ICD-10-CM | POA: Diagnosis not present

## 2020-06-14 DIAGNOSIS — Z7901 Long term (current) use of anticoagulants: Secondary | ICD-10-CM | POA: Diagnosis not present

## 2020-06-14 DIAGNOSIS — G510 Bell's palsy: Secondary | ICD-10-CM | POA: Diagnosis not present

## 2020-06-14 NOTE — TOC Transition Note (Signed)
Transition of Care [facility]) - CM/SW Discharge Note   Patient Details  Name: Amber Kennedy MRN: [id number] Date of Birth: 1933/10/25  Transition of Care [facility]) CM/SW Contact:  Oretha Milch, LCSW Phone Number: 06/14/2020, 8:36 AM   Clinical Narrative:    Patient will discharge to: [place] Discharge date: 06/14/20 Transport by: [facility] RN report number: April,  [phone]  Blenda Mounts, LCSW, [place] Worker II Emergency Department, [place], [place] [phone]    Final next level of care: Skilled Nursing Facility Barriers to Discharge: Barriers Resolved   Patient Goals and CMS Choice Patient states their goals for this hospitalization and ongoing recovery are:: Rehab CMS Medicare.gov Compare Post Acute Care list provided to:: Patient Choice offered to / list presented to : Patient  Discharge Placement                       Discharge Plan and Services In-house Referral: Clinical Social Work   Post Acute Care Choice: [place]                               Social Determinants of Health (SDOH) Interventions     Readmission Risk Interventions No flowsheet data found.

## 2020-06-14 NOTE — Progress Notes (Signed)
Nurse report called to [place]

## 2020-06-15 DIAGNOSIS — E785 Hyperlipidemia, unspecified: Secondary | ICD-10-CM | POA: Diagnosis not present

## 2020-06-15 DIAGNOSIS — Z7901 Long term (current) use of anticoagulants: Secondary | ICD-10-CM | POA: Diagnosis not present

## 2020-06-15 DIAGNOSIS — I4891 Unspecified atrial fibrillation: Secondary | ICD-10-CM | POA: Diagnosis not present

## 2020-06-15 DIAGNOSIS — H6091 Unspecified otitis externa, right ear: Secondary | ICD-10-CM | POA: Diagnosis not present

## 2020-06-15 DIAGNOSIS — G510 Bell's palsy: Secondary | ICD-10-CM | POA: Diagnosis not present

## 2020-06-15 DIAGNOSIS — R2681 Unsteadiness on feet: Secondary | ICD-10-CM | POA: Diagnosis not present

## 2020-06-15 DIAGNOSIS — E871 Hypo-osmolality and hyponatremia: Secondary | ICD-10-CM | POA: Diagnosis not present

## 2020-06-26 DIAGNOSIS — R531 Weakness: Secondary | ICD-10-CM | POA: Diagnosis not present

## 2020-06-27 ENCOUNTER — Ambulatory Visit: Payer: Medicare HMO | Admitting: Cardiology

## 2020-06-27 DIAGNOSIS — H60501 Unspecified acute noninfective otitis externa, right ear: Secondary | ICD-10-CM | POA: Diagnosis not present

## 2020-06-27 DIAGNOSIS — I10 Essential (primary) hypertension: Secondary | ICD-10-CM | POA: Diagnosis not present

## 2020-06-27 DIAGNOSIS — H5461 Unqualified visual loss, right eye, normal vision left eye: Secondary | ICD-10-CM | POA: Diagnosis not present

## 2020-06-27 DIAGNOSIS — I480 Paroxysmal atrial fibrillation: Secondary | ICD-10-CM | POA: Diagnosis not present

## 2020-06-27 DIAGNOSIS — I495 Sick sinus syndrome: Secondary | ICD-10-CM | POA: Diagnosis not present

## 2020-06-27 DIAGNOSIS — H65191 Other acute nonsuppurative otitis media, right ear: Secondary | ICD-10-CM | POA: Diagnosis not present

## 2020-06-27 DIAGNOSIS — M109 Gout, unspecified: Secondary | ICD-10-CM | POA: Diagnosis not present

## 2020-06-27 DIAGNOSIS — H70001 Acute mastoiditis without complications, right ear: Secondary | ICD-10-CM | POA: Diagnosis not present

## 2020-06-27 DIAGNOSIS — G510 Bell's palsy: Secondary | ICD-10-CM | POA: Diagnosis not present

## 2020-06-30 DIAGNOSIS — H60501 Unspecified acute noninfective otitis externa, right ear: Secondary | ICD-10-CM | POA: Diagnosis not present

## 2020-06-30 DIAGNOSIS — I495 Sick sinus syndrome: Secondary | ICD-10-CM | POA: Diagnosis not present

## 2020-06-30 DIAGNOSIS — I480 Paroxysmal atrial fibrillation: Secondary | ICD-10-CM | POA: Diagnosis not present

## 2020-06-30 DIAGNOSIS — H70001 Acute mastoiditis without complications, right ear: Secondary | ICD-10-CM | POA: Diagnosis not present

## 2020-06-30 DIAGNOSIS — H65191 Other acute nonsuppurative otitis media, right ear: Secondary | ICD-10-CM | POA: Diagnosis not present

## 2020-06-30 DIAGNOSIS — H5461 Unqualified visual loss, right eye, normal vision left eye: Secondary | ICD-10-CM | POA: Diagnosis not present

## 2020-06-30 DIAGNOSIS — G510 Bell's palsy: Secondary | ICD-10-CM | POA: Diagnosis not present

## 2020-06-30 DIAGNOSIS — I10 Essential (primary) hypertension: Secondary | ICD-10-CM | POA: Diagnosis not present

## 2020-06-30 DIAGNOSIS — M109 Gout, unspecified: Secondary | ICD-10-CM | POA: Diagnosis not present

## 2020-07-02 DIAGNOSIS — I495 Sick sinus syndrome: Secondary | ICD-10-CM | POA: Diagnosis not present

## 2020-07-02 DIAGNOSIS — I10 Essential (primary) hypertension: Secondary | ICD-10-CM | POA: Diagnosis not present

## 2020-07-02 DIAGNOSIS — H65191 Other acute nonsuppurative otitis media, right ear: Secondary | ICD-10-CM | POA: Diagnosis not present

## 2020-07-02 DIAGNOSIS — H60501 Unspecified acute noninfective otitis externa, right ear: Secondary | ICD-10-CM | POA: Diagnosis not present

## 2020-07-02 DIAGNOSIS — H5461 Unqualified visual loss, right eye, normal vision left eye: Secondary | ICD-10-CM | POA: Diagnosis not present

## 2020-07-02 DIAGNOSIS — I480 Paroxysmal atrial fibrillation: Secondary | ICD-10-CM | POA: Diagnosis not present

## 2020-07-02 DIAGNOSIS — M109 Gout, unspecified: Secondary | ICD-10-CM | POA: Diagnosis not present

## 2020-07-02 DIAGNOSIS — H70001 Acute mastoiditis without complications, right ear: Secondary | ICD-10-CM | POA: Diagnosis not present

## 2020-07-02 DIAGNOSIS — G510 Bell's palsy: Secondary | ICD-10-CM | POA: Diagnosis not present

## 2020-07-04 DIAGNOSIS — M109 Gout, unspecified: Secondary | ICD-10-CM | POA: Diagnosis not present

## 2020-07-04 DIAGNOSIS — I480 Paroxysmal atrial fibrillation: Secondary | ICD-10-CM | POA: Diagnosis not present

## 2020-07-04 DIAGNOSIS — H70001 Acute mastoiditis without complications, right ear: Secondary | ICD-10-CM | POA: Diagnosis not present

## 2020-07-04 DIAGNOSIS — I10 Essential (primary) hypertension: Secondary | ICD-10-CM | POA: Diagnosis not present

## 2020-07-04 DIAGNOSIS — H60501 Unspecified acute noninfective otitis externa, right ear: Secondary | ICD-10-CM | POA: Diagnosis not present

## 2020-07-04 DIAGNOSIS — G510 Bell's palsy: Secondary | ICD-10-CM | POA: Diagnosis not present

## 2020-07-04 DIAGNOSIS — H5461 Unqualified visual loss, right eye, normal vision left eye: Secondary | ICD-10-CM | POA: Diagnosis not present

## 2020-07-04 DIAGNOSIS — H65191 Other acute nonsuppurative otitis media, right ear: Secondary | ICD-10-CM | POA: Diagnosis not present

## 2020-07-04 DIAGNOSIS — I495 Sick sinus syndrome: Secondary | ICD-10-CM | POA: Diagnosis not present

## 2020-07-07 DIAGNOSIS — H65191 Other acute nonsuppurative otitis media, right ear: Secondary | ICD-10-CM | POA: Diagnosis not present

## 2020-07-07 DIAGNOSIS — M109 Gout, unspecified: Secondary | ICD-10-CM | POA: Diagnosis not present

## 2020-07-07 DIAGNOSIS — G510 Bell's palsy: Secondary | ICD-10-CM | POA: Diagnosis not present

## 2020-07-07 DIAGNOSIS — I480 Paroxysmal atrial fibrillation: Secondary | ICD-10-CM | POA: Diagnosis not present

## 2020-07-07 DIAGNOSIS — I10 Essential (primary) hypertension: Secondary | ICD-10-CM | POA: Diagnosis not present

## 2020-07-07 DIAGNOSIS — H60501 Unspecified acute noninfective otitis externa, right ear: Secondary | ICD-10-CM | POA: Diagnosis not present

## 2020-07-07 DIAGNOSIS — H70001 Acute mastoiditis without complications, right ear: Secondary | ICD-10-CM | POA: Diagnosis not present

## 2020-07-07 DIAGNOSIS — H5461 Unqualified visual loss, right eye, normal vision left eye: Secondary | ICD-10-CM | POA: Diagnosis not present

## 2020-07-07 DIAGNOSIS — I495 Sick sinus syndrome: Secondary | ICD-10-CM | POA: Diagnosis not present

## 2020-07-09 DIAGNOSIS — H5461 Unqualified visual loss, right eye, normal vision left eye: Secondary | ICD-10-CM | POA: Diagnosis not present

## 2020-07-09 DIAGNOSIS — H60501 Unspecified acute noninfective otitis externa, right ear: Secondary | ICD-10-CM | POA: Diagnosis not present

## 2020-07-09 DIAGNOSIS — H65191 Other acute nonsuppurative otitis media, right ear: Secondary | ICD-10-CM | POA: Diagnosis not present

## 2020-07-09 DIAGNOSIS — I10 Essential (primary) hypertension: Secondary | ICD-10-CM | POA: Diagnosis not present

## 2020-07-09 DIAGNOSIS — I495 Sick sinus syndrome: Secondary | ICD-10-CM | POA: Diagnosis not present

## 2020-07-09 DIAGNOSIS — H70001 Acute mastoiditis without complications, right ear: Secondary | ICD-10-CM | POA: Diagnosis not present

## 2020-07-09 DIAGNOSIS — M109 Gout, unspecified: Secondary | ICD-10-CM | POA: Diagnosis not present

## 2020-07-09 DIAGNOSIS — G510 Bell's palsy: Secondary | ICD-10-CM | POA: Diagnosis not present

## 2020-07-09 DIAGNOSIS — I480 Paroxysmal atrial fibrillation: Secondary | ICD-10-CM | POA: Diagnosis not present

## 2020-07-10 DIAGNOSIS — H5461 Unqualified visual loss, right eye, normal vision left eye: Secondary | ICD-10-CM | POA: Diagnosis not present

## 2020-07-10 DIAGNOSIS — I480 Paroxysmal atrial fibrillation: Secondary | ICD-10-CM | POA: Diagnosis not present

## 2020-07-10 DIAGNOSIS — M109 Gout, unspecified: Secondary | ICD-10-CM | POA: Diagnosis not present

## 2020-07-10 DIAGNOSIS — I10 Essential (primary) hypertension: Secondary | ICD-10-CM | POA: Diagnosis not present

## 2020-07-10 DIAGNOSIS — I495 Sick sinus syndrome: Secondary | ICD-10-CM | POA: Diagnosis not present

## 2020-07-10 DIAGNOSIS — H60501 Unspecified acute noninfective otitis externa, right ear: Secondary | ICD-10-CM | POA: Diagnosis not present

## 2020-07-10 DIAGNOSIS — H65191 Other acute nonsuppurative otitis media, right ear: Secondary | ICD-10-CM | POA: Diagnosis not present

## 2020-07-10 DIAGNOSIS — G510 Bell's palsy: Secondary | ICD-10-CM | POA: Diagnosis not present

## 2020-07-10 DIAGNOSIS — H70001 Acute mastoiditis without complications, right ear: Secondary | ICD-10-CM | POA: Diagnosis not present

## 2020-07-11 DIAGNOSIS — H65191 Other acute nonsuppurative otitis media, right ear: Secondary | ICD-10-CM | POA: Diagnosis not present

## 2020-07-11 DIAGNOSIS — I495 Sick sinus syndrome: Secondary | ICD-10-CM | POA: Diagnosis not present

## 2020-07-11 DIAGNOSIS — I10 Essential (primary) hypertension: Secondary | ICD-10-CM | POA: Diagnosis not present

## 2020-07-11 DIAGNOSIS — H70001 Acute mastoiditis without complications, right ear: Secondary | ICD-10-CM | POA: Diagnosis not present

## 2020-07-11 DIAGNOSIS — H60501 Unspecified acute noninfective otitis externa, right ear: Secondary | ICD-10-CM | POA: Diagnosis not present

## 2020-07-11 DIAGNOSIS — G510 Bell's palsy: Secondary | ICD-10-CM | POA: Diagnosis not present

## 2020-07-11 DIAGNOSIS — I480 Paroxysmal atrial fibrillation: Secondary | ICD-10-CM | POA: Diagnosis not present

## 2020-07-11 DIAGNOSIS — M109 Gout, unspecified: Secondary | ICD-10-CM | POA: Diagnosis not present

## 2020-07-11 DIAGNOSIS — H5461 Unqualified visual loss, right eye, normal vision left eye: Secondary | ICD-10-CM | POA: Diagnosis not present

## 2020-07-13 ENCOUNTER — Telehealth: Payer: Self-pay | Admitting: Cardiology

## 2020-07-13 NOTE — Telephone Encounter (Signed)
Noted. Already on anticoagulation for PAF.  Thanks MJP

## 2020-07-14 DIAGNOSIS — H5461 Unqualified visual loss, right eye, normal vision left eye: Secondary | ICD-10-CM | POA: Diagnosis not present

## 2020-07-14 DIAGNOSIS — M109 Gout, unspecified: Secondary | ICD-10-CM | POA: Diagnosis not present

## 2020-07-14 DIAGNOSIS — I480 Paroxysmal atrial fibrillation: Secondary | ICD-10-CM | POA: Diagnosis not present

## 2020-07-14 DIAGNOSIS — H65191 Other acute nonsuppurative otitis media, right ear: Secondary | ICD-10-CM | POA: Diagnosis not present

## 2020-07-14 DIAGNOSIS — H60501 Unspecified acute noninfective otitis externa, right ear: Secondary | ICD-10-CM | POA: Diagnosis not present

## 2020-07-14 DIAGNOSIS — H70001 Acute mastoiditis without complications, right ear: Secondary | ICD-10-CM | POA: Diagnosis not present

## 2020-07-14 DIAGNOSIS — I10 Essential (primary) hypertension: Secondary | ICD-10-CM | POA: Diagnosis not present

## 2020-07-14 DIAGNOSIS — G510 Bell's palsy: Secondary | ICD-10-CM | POA: Diagnosis not present

## 2020-07-14 DIAGNOSIS — I495 Sick sinus syndrome: Secondary | ICD-10-CM | POA: Diagnosis not present

## 2020-07-17 DIAGNOSIS — I495 Sick sinus syndrome: Secondary | ICD-10-CM | POA: Diagnosis not present

## 2020-07-17 DIAGNOSIS — I480 Paroxysmal atrial fibrillation: Secondary | ICD-10-CM | POA: Diagnosis not present

## 2020-07-17 DIAGNOSIS — H70001 Acute mastoiditis without complications, right ear: Secondary | ICD-10-CM | POA: Diagnosis not present

## 2020-07-17 DIAGNOSIS — G510 Bell's palsy: Secondary | ICD-10-CM | POA: Diagnosis not present

## 2020-07-17 DIAGNOSIS — M109 Gout, unspecified: Secondary | ICD-10-CM | POA: Diagnosis not present

## 2020-07-17 DIAGNOSIS — I10 Essential (primary) hypertension: Secondary | ICD-10-CM | POA: Diagnosis not present

## 2020-07-17 DIAGNOSIS — H65191 Other acute nonsuppurative otitis media, right ear: Secondary | ICD-10-CM | POA: Diagnosis not present

## 2020-07-17 DIAGNOSIS — H5461 Unqualified visual loss, right eye, normal vision left eye: Secondary | ICD-10-CM | POA: Diagnosis not present

## 2020-07-17 DIAGNOSIS — H60501 Unspecified acute noninfective otitis externa, right ear: Secondary | ICD-10-CM | POA: Diagnosis not present

## 2020-07-22 DIAGNOSIS — G510 Bell's palsy: Secondary | ICD-10-CM | POA: Diagnosis not present

## 2020-07-22 DIAGNOSIS — H9191 Unspecified hearing loss, right ear: Secondary | ICD-10-CM | POA: Diagnosis not present

## 2020-07-22 DIAGNOSIS — H9211 Otorrhea, right ear: Secondary | ICD-10-CM | POA: Diagnosis not present

## 2020-07-30 DIAGNOSIS — G459 Transient cerebral ischemic attack, unspecified: Secondary | ICD-10-CM | POA: Diagnosis not present

## 2020-07-30 DIAGNOSIS — Z950 Presence of cardiac pacemaker: Secondary | ICD-10-CM | POA: Diagnosis not present

## 2020-07-30 DIAGNOSIS — Z45018 Encounter for adjustment and management of other part of cardiac pacemaker: Secondary | ICD-10-CM | POA: Diagnosis not present

## 2020-07-31 DIAGNOSIS — H70001 Acute mastoiditis without complications, right ear: Secondary | ICD-10-CM | POA: Diagnosis not present

## 2020-07-31 DIAGNOSIS — H60501 Unspecified acute noninfective otitis externa, right ear: Secondary | ICD-10-CM | POA: Diagnosis not present

## 2020-07-31 DIAGNOSIS — I495 Sick sinus syndrome: Secondary | ICD-10-CM | POA: Diagnosis not present

## 2020-07-31 DIAGNOSIS — H5461 Unqualified visual loss, right eye, normal vision left eye: Secondary | ICD-10-CM | POA: Diagnosis not present

## 2020-07-31 DIAGNOSIS — H65191 Other acute nonsuppurative otitis media, right ear: Secondary | ICD-10-CM | POA: Diagnosis not present

## 2020-07-31 DIAGNOSIS — G510 Bell's palsy: Secondary | ICD-10-CM | POA: Diagnosis not present

## 2020-07-31 DIAGNOSIS — M109 Gout, unspecified: Secondary | ICD-10-CM | POA: Diagnosis not present

## 2020-07-31 DIAGNOSIS — I10 Essential (primary) hypertension: Secondary | ICD-10-CM | POA: Diagnosis not present

## 2020-07-31 DIAGNOSIS — I480 Paroxysmal atrial fibrillation: Secondary | ICD-10-CM | POA: Diagnosis not present

## 2020-08-01 DIAGNOSIS — H70001 Acute mastoiditis without complications, right ear: Secondary | ICD-10-CM | POA: Diagnosis not present

## 2020-08-01 DIAGNOSIS — H60501 Unspecified acute noninfective otitis externa, right ear: Secondary | ICD-10-CM | POA: Diagnosis not present

## 2020-08-01 DIAGNOSIS — I495 Sick sinus syndrome: Secondary | ICD-10-CM | POA: Diagnosis not present

## 2020-08-01 DIAGNOSIS — H65191 Other acute nonsuppurative otitis media, right ear: Secondary | ICD-10-CM | POA: Diagnosis not present

## 2020-08-01 DIAGNOSIS — H5461 Unqualified visual loss, right eye, normal vision left eye: Secondary | ICD-10-CM | POA: Diagnosis not present

## 2020-08-01 DIAGNOSIS — M109 Gout, unspecified: Secondary | ICD-10-CM | POA: Diagnosis not present

## 2020-08-01 DIAGNOSIS — I10 Essential (primary) hypertension: Secondary | ICD-10-CM | POA: Diagnosis not present

## 2020-08-01 DIAGNOSIS — I480 Paroxysmal atrial fibrillation: Secondary | ICD-10-CM | POA: Diagnosis not present

## 2020-08-01 DIAGNOSIS — G510 Bell's palsy: Secondary | ICD-10-CM | POA: Diagnosis not present

## 2020-08-04 DIAGNOSIS — G510 Bell's palsy: Secondary | ICD-10-CM | POA: Diagnosis not present

## 2020-08-04 DIAGNOSIS — H60501 Unspecified acute noninfective otitis externa, right ear: Secondary | ICD-10-CM | POA: Diagnosis not present

## 2020-08-04 DIAGNOSIS — M109 Gout, unspecified: Secondary | ICD-10-CM | POA: Diagnosis not present

## 2020-08-04 DIAGNOSIS — H65191 Other acute nonsuppurative otitis media, right ear: Secondary | ICD-10-CM | POA: Diagnosis not present

## 2020-08-04 DIAGNOSIS — H70001 Acute mastoiditis without complications, right ear: Secondary | ICD-10-CM | POA: Diagnosis not present

## 2020-08-04 DIAGNOSIS — H5461 Unqualified visual loss, right eye, normal vision left eye: Secondary | ICD-10-CM | POA: Diagnosis not present

## 2020-08-04 DIAGNOSIS — I10 Essential (primary) hypertension: Secondary | ICD-10-CM | POA: Diagnosis not present

## 2020-08-04 DIAGNOSIS — I495 Sick sinus syndrome: Secondary | ICD-10-CM | POA: Diagnosis not present

## 2020-08-04 DIAGNOSIS — I480 Paroxysmal atrial fibrillation: Secondary | ICD-10-CM | POA: Diagnosis not present

## 2020-08-06 DIAGNOSIS — M109 Gout, unspecified: Secondary | ICD-10-CM | POA: Diagnosis not present

## 2020-08-06 DIAGNOSIS — H70001 Acute mastoiditis without complications, right ear: Secondary | ICD-10-CM | POA: Diagnosis not present

## 2020-08-06 DIAGNOSIS — H5461 Unqualified visual loss, right eye, normal vision left eye: Secondary | ICD-10-CM | POA: Diagnosis not present

## 2020-08-06 DIAGNOSIS — I10 Essential (primary) hypertension: Secondary | ICD-10-CM | POA: Diagnosis not present

## 2020-08-06 DIAGNOSIS — I495 Sick sinus syndrome: Secondary | ICD-10-CM | POA: Diagnosis not present

## 2020-08-06 DIAGNOSIS — I480 Paroxysmal atrial fibrillation: Secondary | ICD-10-CM | POA: Diagnosis not present

## 2020-08-06 DIAGNOSIS — H65191 Other acute nonsuppurative otitis media, right ear: Secondary | ICD-10-CM | POA: Diagnosis not present

## 2020-08-06 DIAGNOSIS — G510 Bell's palsy: Secondary | ICD-10-CM | POA: Diagnosis not present

## 2020-08-06 DIAGNOSIS — H60501 Unspecified acute noninfective otitis externa, right ear: Secondary | ICD-10-CM | POA: Diagnosis not present

## 2020-08-08 ENCOUNTER — Other Ambulatory Visit: Payer: Self-pay

## 2020-08-08 MED ORDER — DILTIAZEM HCL ER COATED BEADS 180 MG PO CP24: 180.0000 mg | ORAL_CAPSULE | Freq: Every day | ORAL | 1 refills | Status: DC

## 2020-08-08 MED ORDER — APIXABAN 5 MG PO TABS: 5.0000 mg | ORAL_TABLET | Freq: Two times a day (BID) | ORAL | 1 refills | Status: DC

## 2020-08-08 MED ORDER — LOSARTAN POTASSIUM 50 MG PO TABS: 50.0000 mg | ORAL_TABLET | Freq: Every day | ORAL | 1 refills | Status: DC

## 2020-08-14 ENCOUNTER — Encounter: Payer: Medicare HMO | Admitting: Internal Medicine

## 2020-08-14 DIAGNOSIS — H70001 Acute mastoiditis without complications, right ear: Secondary | ICD-10-CM | POA: Diagnosis not present

## 2020-08-14 DIAGNOSIS — M109 Gout, unspecified: Secondary | ICD-10-CM | POA: Diagnosis not present

## 2020-08-14 DIAGNOSIS — H60501 Unspecified acute noninfective otitis externa, right ear: Secondary | ICD-10-CM | POA: Diagnosis not present

## 2020-08-14 DIAGNOSIS — H5461 Unqualified visual loss, right eye, normal vision left eye: Secondary | ICD-10-CM | POA: Diagnosis not present

## 2020-08-14 DIAGNOSIS — H65191 Other acute nonsuppurative otitis media, right ear: Secondary | ICD-10-CM | POA: Diagnosis not present

## 2020-08-14 DIAGNOSIS — G510 Bell's palsy: Secondary | ICD-10-CM | POA: Diagnosis not present

## 2020-08-14 DIAGNOSIS — I495 Sick sinus syndrome: Secondary | ICD-10-CM | POA: Diagnosis not present

## 2020-08-14 DIAGNOSIS — I10 Essential (primary) hypertension: Secondary | ICD-10-CM | POA: Diagnosis not present

## 2020-08-14 DIAGNOSIS — I480 Paroxysmal atrial fibrillation: Secondary | ICD-10-CM | POA: Diagnosis not present

## 2020-08-19 DIAGNOSIS — I10 Essential (primary) hypertension: Secondary | ICD-10-CM | POA: Diagnosis not present

## 2020-08-19 DIAGNOSIS — H70001 Acute mastoiditis without complications, right ear: Secondary | ICD-10-CM | POA: Diagnosis not present

## 2020-08-19 DIAGNOSIS — H5461 Unqualified visual loss, right eye, normal vision left eye: Secondary | ICD-10-CM | POA: Diagnosis not present

## 2020-08-19 DIAGNOSIS — H65191 Other acute nonsuppurative otitis media, right ear: Secondary | ICD-10-CM | POA: Diagnosis not present

## 2020-08-19 DIAGNOSIS — M109 Gout, unspecified: Secondary | ICD-10-CM | POA: Diagnosis not present

## 2020-08-19 DIAGNOSIS — I495 Sick sinus syndrome: Secondary | ICD-10-CM | POA: Diagnosis not present

## 2020-08-19 DIAGNOSIS — I480 Paroxysmal atrial fibrillation: Secondary | ICD-10-CM | POA: Diagnosis not present

## 2020-08-19 DIAGNOSIS — G510 Bell's palsy: Secondary | ICD-10-CM | POA: Diagnosis not present

## 2020-08-19 DIAGNOSIS — H60501 Unspecified acute noninfective otitis externa, right ear: Secondary | ICD-10-CM | POA: Diagnosis not present

## 2020-09-22 ENCOUNTER — Other Ambulatory Visit: Payer: Self-pay

## 2020-09-22 ENCOUNTER — Encounter: Payer: Self-pay | Admitting: Cardiology

## 2020-09-22 ENCOUNTER — Ambulatory Visit: Payer: Medicare HMO | Admitting: Cardiology

## 2020-09-22 VITALS — BP 143/71 | HR 62 | Temp 97.5°F | Resp 15 | Ht 67.0 in | Wt 173.0 lb

## 2020-09-22 DIAGNOSIS — I480 Paroxysmal atrial fibrillation: Secondary | ICD-10-CM | POA: Diagnosis not present

## 2020-09-22 DIAGNOSIS — I10 Essential (primary) hypertension: Secondary | ICD-10-CM | POA: Diagnosis not present

## 2020-09-22 DIAGNOSIS — Z45018 Encounter for adjustment and management of other part of cardiac pacemaker: Secondary | ICD-10-CM | POA: Diagnosis not present

## 2020-09-22 DIAGNOSIS — I495 Sick sinus syndrome: Secondary | ICD-10-CM

## 2020-09-22 DIAGNOSIS — Z950 Presence of cardiac pacemaker: Secondary | ICD-10-CM | POA: Diagnosis not present

## 2020-09-22 NOTE — Progress Notes (Signed)
Patient referred by [name], MD for atrial fibrillation  Subjective:   Amber Kennedy, female    DOB: 11/29/1933, 87 y.o.   MRN: [id number]   Chief Complaint  Patient presents with   Atrial Fibrillation   Hypertension   Follow-up    6 month   Hospitalization Follow-up   HPI  87 y.o. Caucasian female with hypertension, hyperlipidemia, paroxysmal atrial fibrillation, sick sinu syndrome s/p pacemaker placement (04/2020), TIA (04/2020)  Since her last visit, patient had 1 episode of right-sided ear pain and facial droop, which was diagnosed as Bell's palsy.  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  Nonetheless, she was determined not to have had repeat stroke.  Patient has been otherwise doing well, denies any obvious chest pain or shortness of breath symptoms with her level of activity.  Also denies any leg swelling.  She feels tired towards the end of the day, but denies any of the above complaints.  Patient missed her in office pacemaker interrogation visit due to [place] exposure.  Pacemaker was interrogated in office today, details below.   Current Outpatient Medications on File Prior to Visit  Medication Sig Dispense Refill   apixaban (ELIQUIS) 5 MG TABS tablet Take 1 tablet (5 mg total) by mouth 2 (two) times daily. 180 tablet 1   artificial tears (LACRILUBE) OINT ophthalmic ointment Place 1 application into the right eye 3 (three) times daily. 3.5 g 0   cefdinir (OMNICEF) 300 MG capsule Take 1 capsule (300 mg total) by mouth 2 (two) times daily. 6 capsule 0   diltiazem (CARDIZEM CD) 180 MG 24 hr capsule Take 1 capsule (180 mg total) by mouth daily. 90 capsule 1   flecainide (TAMBOCOR) 50 MG tablet Take 1 tablet (50 mg total) by mouth every 12 (twelve) hours. 60 tablet 2   losartan (COZAAR) 50 MG tablet Take 1 tablet (50 mg total) by mouth daily. 90 tablet 1   metoprolol succinate (TOPROL-XL) 100 MG 24 hr tablet Take 1 tablet (100  mg total) by mouth daily. 90 tablet 2   Multiple Vitamins-Calcium (ONE-A-DAY WOMENS FORMULA) TABS Take 1 tablet by mouth daily with breakfast.     polyethylene glycol (MIRALAX / GLYCOLAX) 17 g packet Take 17 g by mouth daily as needed. 14 each 0   predniSONE (DELTASONE) 20 MG tablet Take 3 tablets (60 mg total) by mouth daily with breakfast. 18 tablet 0   rosuvastatin (CRESTOR) 20 MG tablet Take 1 tablet (20 mg total) by mouth daily. 30 tablet 11   senna-docusate (SENOKOT-S) 8.6-50 MG tablet Take 1 tablet by mouth at bedtime as needed for mild constipation.     valACYclovir (VALTREX) 1000 MG tablet Take 1 tablet (1,000 mg total) by mouth 3 (three) times daily. 4 tablet 0   vitamin C (ASCORBIC ACID) 500 MG tablet Take 500 mg by mouth daily as needed (for supplementation).     No current facility-administered medications on file prior to visit.    Cardiovascular studies:  Scheduled  In office pacemaker check 09/22/20  Single (S)/Dual (D)/BV: D. Presenting: AP-VS. PR interval 346 msec Pacemaker dependant:  No AP 4.7%, VP 58%. AMS Episodes 416.  AT/AF burden 96% .  HVR 0.  Longevity 9- 10 Years. Magnet rate: >85%. Lead measurements: Stable. Histogram: Low (L)/normal (N)/high (H)  N. Patient activity Low.   Observations: Predominantly in Afib Apr-Aug 2022, converted to sinus rhythm last week Changes:None.   EKG  09/22/2020: Ventricular paced rhythm  Unscheduled (Alert) Alert 07/11/2020:  Patient in persistent atrial fibrillation, 93% burden since 05/13/2020 with controlled ventricular rate. AP 7.6%, VP 52%.  Longevity >5 years.  EKG 06/05/2020: Atrial fibrillation 107 bpm Occasional ectopic ventricular beat    Anterolateral T wave inversion, consider ischemia  Unscheduled (Alert) 05/16/2020: Persistent AF since 05/15/2020 with controlled ventricular response. AT/AF burden 80%.  Normal pacemaker function. RA paced 42% and RV 56%  Echocardiogram 04/28/2020:  1. Left ventricular ejection  fraction, by estimation, is 60 to 65%. The  left ventricle has normal function. The left ventricle has no regional  wall motion abnormalities. There is severe left ventricular hypertrophy.  Left ventricular diastolic parameters   are indeterminate.   2. Right ventricular systolic function is normal. The right ventricular  size is normal. There is mildly elevated pulmonary artery systolic  pressure.   3. Left atrial size was moderately dilated.   4. The mitral valve is degenerative. Mild mitral valve regurgitation. No  evidence of mitral stenosis. Moderate mitral annular calcification.   5. The aortic valve was not well visualized. Aortic valve regurgitation  is not visualized. No aortic stenosis is present.   6. Aortic dilatation noted. There is mild dilatation of the ascending  aorta, measuring 38 mm.   7. The inferior vena cava is normal in size with greater than 50%  respiratory variability, suggesting right atrial pressure of 3 mmHg.   CTA head/neck 04/28/2020: 1. 60% diameter stenosis proximal left internal carotid artery due to atherosclerotic disease 2. Right carotid artery widely patent without stenosis. Both vertebral arteries widely patent. 3. Atherosclerotic aortic arch.    EKG 03/24/2020: Sinus rhythm 67 bpm  Left axis deviation Possible old anteroseptal infarct  Echocardiogram 02/06/2017: - Left ventricle: The cavity size was normal. Wall thickness was   increased in a pattern of mild LVH. Systolic function was normal.   The estimated ejection fraction was in the range of 60% to 65%.   Wall motion was normal; there were no regional wall motion   abnormalities. Features are consistent with a pseudonormal left   ventricular filling pattern, with concomitant abnormal relaxation   and increased filling pressure (grade 2 diastolic dysfunction).   Indeterminate filling pressures. - Mitral valve: Mildly calcified annulus. - Left atrium: The atrium was mildly dilated. -  Atrial septum: No defect or patent foramen ovale was identified. - Tricuspid valve: There was mild-moderate regurgitation. - Pulmonary arteries: PA peak pressure: 35 mm Hg (S). - Systemic veins: Dilated IVC with normal respiratory variation.   Estimated CVP 8 mmHg.  Recent labs: 05/01/2020: Glucose 98, BUN/Cr 11/0.80. EGFR >60. Na/K 136/3.7. Albumin 3.2. Total protein 5.7. Rest of the CMP normal H/H 12/39. MCV 92. Platelets 197 TSH 3.0 normal  03/13/2020: Glucose 88, BUN/Cr 15/0.94. EGFR 57. Na/K 137/4.5. Rest of the CMP normal HbA1C 5.6% Chol 155, TG 150, HDL 46, LDL 83  02/01/2019: Glucose 115, BUN/Cr 16/0.9. EGFR 57 HbA1C 5.3% Chol 189, TG 259, HDL 42, LDL 102 TSH 6.3 normal  12/30/2017: Glucose 94. BUN/Cr 16/0.94. eGFR 57. Na/K 138/4.6. HbA1C 5.4% Chol 175, TG 185, HDL 39, LDL 137.   Review of Systems  Constitutional: Negative for decreased appetite, malaise/fatigue, weight gain and weight loss.  HENT:  Negative for congestion.        Bell's palsy  Eyes:  Negative for visual disturbance.  Cardiovascular:  Negative for chest pain, dyspnea on exertion, leg swelling, palpitations and syncope.  Respiratory:  Negative for cough.  Endocrine: Negative for cold intolerance.  Hematologic/Lymphatic: Does not bruise/bleed easily.  Skin:  Negative for itching and rash.  Musculoskeletal:  Negative for myalgias.  Gastrointestinal:  Negative for abdominal pain, nausea and vomiting.  Genitourinary:  Negative for dysuria.  Neurological:  Negative for dizziness and weakness.  Psychiatric/Behavioral:  The patient is not nervous/anxious.   All other systems reviewed and are negative.        Vitals:   09/22/20 1208 09/22/20 1212  BP: 140/73 (!) 143/71  Pulse: 63 62  Resp: 15   Temp: (!) 97.5 F (36.4 C)   SpO2: 95%       Objective:   Physical Exam Vitals and nursing note reviewed.  Constitutional:      Appearance: She is well-developed.  Neck:     Vascular: No JVD.   Cardiovascular:     Rate and Rhythm: Normal rate and regular rhythm.     Pulses: Intact distal pulses.     Heart sounds: Normal heart sounds. No murmur heard. Pulmonary:     Effort: Pulmonary effort is normal.     Breath sounds: Normal breath sounds. No wheezing or rales.  Musculoskeletal:     Right lower leg: No edema.     Left lower leg: No edema.            Assessment & Recommendations:   87 y.o. Caucasian female with hypertension, hyperlipidemia, paroxysmal atrial fibrillation, sick sinu syndrome s/p pacemaker placement (04/2020), TIA (04/2020)  Paroxysmal A. fib: After months of being in A. fib, she seems to have converted to sinus rhythm last week.   Continue flecainide at 50 mg bid.  She is also on metoprolol and diltiazem, bradycardia is not a concern given normal functioning pacemaker.   Will obtain remote transmission in 2 weeks to check if she still in sinus rhythm. Given that she is relatively asymptomatic, she would like to avoid cardioversion for A. fib unless absolutely needed. If she is back in A. fib during upcoming transmission, could consider increasing flecainide dose. CHA2DS2VASc score 4. Annual stroke risk 4%. Continue Xarelto 20 mg daily.  Hypertension: Reasonably well-controlled  Hyperlipidemia: She is reluctant to try lipid lowering therapy, and would like to make diet and lifestlye changes.  LDL is down to 102.  F/u in 2 months   Jayzon Taras Esther Hardy, MD [facility] Pager: [phone] Office: [phone] If no answer Cell [phone]

## 2020-10-02 DIAGNOSIS — I480 Paroxysmal atrial fibrillation: Secondary | ICD-10-CM | POA: Diagnosis not present

## 2020-10-02 DIAGNOSIS — R269 Unspecified abnormalities of gait and mobility: Secondary | ICD-10-CM | POA: Diagnosis not present

## 2020-10-02 DIAGNOSIS — H9192 Unspecified hearing loss, left ear: Secondary | ICD-10-CM | POA: Diagnosis not present

## 2020-10-02 DIAGNOSIS — I10 Essential (primary) hypertension: Secondary | ICD-10-CM | POA: Diagnosis not present

## 2020-10-02 DIAGNOSIS — Z8673 Personal history of transient ischemic attack (TIA), and cerebral infarction without residual deficits: Secondary | ICD-10-CM | POA: Diagnosis not present

## 2020-10-06 DIAGNOSIS — G510 Bell's palsy: Secondary | ICD-10-CM | POA: Diagnosis not present

## 2020-10-06 DIAGNOSIS — N183 Chronic kidney disease, stage 3 unspecified: Secondary | ICD-10-CM | POA: Diagnosis not present

## 2020-10-06 DIAGNOSIS — I5030 Unspecified diastolic (congestive) heart failure: Secondary | ICD-10-CM | POA: Diagnosis not present

## 2020-10-06 DIAGNOSIS — H6691 Otitis media, unspecified, right ear: Secondary | ICD-10-CM | POA: Diagnosis not present

## 2020-10-06 DIAGNOSIS — R7303 Prediabetes: Secondary | ICD-10-CM | POA: Diagnosis not present

## 2020-10-06 DIAGNOSIS — H9192 Unspecified hearing loss, left ear: Secondary | ICD-10-CM | POA: Diagnosis not present

## 2020-10-06 DIAGNOSIS — E7800 Pure hypercholesterolemia, unspecified: Secondary | ICD-10-CM | POA: Diagnosis not present

## 2020-10-06 DIAGNOSIS — H6091 Unspecified otitis externa, right ear: Secondary | ICD-10-CM | POA: Diagnosis not present

## 2020-10-06 DIAGNOSIS — I480 Paroxysmal atrial fibrillation: Secondary | ICD-10-CM | POA: Diagnosis not present

## 2020-10-06 DIAGNOSIS — I130 Hypertensive heart and chronic kidney disease with heart failure and stage 1 through stage 4 chronic kidney disease, or unspecified chronic kidney disease: Secondary | ICD-10-CM | POA: Diagnosis not present

## 2020-10-06 DIAGNOSIS — M109 Gout, unspecified: Secondary | ICD-10-CM | POA: Diagnosis not present

## 2020-10-08 DIAGNOSIS — I480 Paroxysmal atrial fibrillation: Secondary | ICD-10-CM | POA: Diagnosis not present

## 2020-10-08 DIAGNOSIS — G510 Bell's palsy: Secondary | ICD-10-CM | POA: Diagnosis not present

## 2020-10-08 DIAGNOSIS — I130 Hypertensive heart and chronic kidney disease with heart failure and stage 1 through stage 4 chronic kidney disease, or unspecified chronic kidney disease: Secondary | ICD-10-CM | POA: Diagnosis not present

## 2020-10-08 DIAGNOSIS — I5030 Unspecified diastolic (congestive) heart failure: Secondary | ICD-10-CM | POA: Diagnosis not present

## 2020-10-08 DIAGNOSIS — H6691 Otitis media, unspecified, right ear: Secondary | ICD-10-CM | POA: Diagnosis not present

## 2020-10-08 DIAGNOSIS — M109 Gout, unspecified: Secondary | ICD-10-CM | POA: Diagnosis not present

## 2020-10-08 DIAGNOSIS — H9192 Unspecified hearing loss, left ear: Secondary | ICD-10-CM | POA: Diagnosis not present

## 2020-10-08 DIAGNOSIS — H6091 Unspecified otitis externa, right ear: Secondary | ICD-10-CM | POA: Diagnosis not present

## 2020-10-08 DIAGNOSIS — N183 Chronic kidney disease, stage 3 unspecified: Secondary | ICD-10-CM | POA: Diagnosis not present

## 2020-10-09 DIAGNOSIS — I480 Paroxysmal atrial fibrillation: Secondary | ICD-10-CM | POA: Diagnosis not present

## 2020-10-09 DIAGNOSIS — N183 Chronic kidney disease, stage 3 unspecified: Secondary | ICD-10-CM | POA: Diagnosis not present

## 2020-10-09 DIAGNOSIS — H9192 Unspecified hearing loss, left ear: Secondary | ICD-10-CM | POA: Diagnosis not present

## 2020-10-09 DIAGNOSIS — H6691 Otitis media, unspecified, right ear: Secondary | ICD-10-CM | POA: Diagnosis not present

## 2020-10-09 DIAGNOSIS — M109 Gout, unspecified: Secondary | ICD-10-CM | POA: Diagnosis not present

## 2020-10-09 DIAGNOSIS — I5030 Unspecified diastolic (congestive) heart failure: Secondary | ICD-10-CM | POA: Diagnosis not present

## 2020-10-09 DIAGNOSIS — I130 Hypertensive heart and chronic kidney disease with heart failure and stage 1 through stage 4 chronic kidney disease, or unspecified chronic kidney disease: Secondary | ICD-10-CM | POA: Diagnosis not present

## 2020-10-09 DIAGNOSIS — G510 Bell's palsy: Secondary | ICD-10-CM | POA: Diagnosis not present

## 2020-10-09 DIAGNOSIS — H6091 Unspecified otitis externa, right ear: Secondary | ICD-10-CM | POA: Diagnosis not present

## 2020-10-13 DIAGNOSIS — I480 Paroxysmal atrial fibrillation: Secondary | ICD-10-CM | POA: Diagnosis not present

## 2020-10-13 DIAGNOSIS — H6691 Otitis media, unspecified, right ear: Secondary | ICD-10-CM | POA: Diagnosis not present

## 2020-10-13 DIAGNOSIS — M109 Gout, unspecified: Secondary | ICD-10-CM | POA: Diagnosis not present

## 2020-10-13 DIAGNOSIS — H9192 Unspecified hearing loss, left ear: Secondary | ICD-10-CM | POA: Diagnosis not present

## 2020-10-13 DIAGNOSIS — H6091 Unspecified otitis externa, right ear: Secondary | ICD-10-CM | POA: Diagnosis not present

## 2020-10-13 DIAGNOSIS — G510 Bell's palsy: Secondary | ICD-10-CM | POA: Diagnosis not present

## 2020-10-13 DIAGNOSIS — I5030 Unspecified diastolic (congestive) heart failure: Secondary | ICD-10-CM | POA: Diagnosis not present

## 2020-10-13 DIAGNOSIS — N183 Chronic kidney disease, stage 3 unspecified: Secondary | ICD-10-CM | POA: Diagnosis not present

## 2020-10-13 DIAGNOSIS — I130 Hypertensive heart and chronic kidney disease with heart failure and stage 1 through stage 4 chronic kidney disease, or unspecified chronic kidney disease: Secondary | ICD-10-CM | POA: Diagnosis not present

## 2020-10-16 DIAGNOSIS — I130 Hypertensive heart and chronic kidney disease with heart failure and stage 1 through stage 4 chronic kidney disease, or unspecified chronic kidney disease: Secondary | ICD-10-CM | POA: Diagnosis not present

## 2020-10-16 DIAGNOSIS — H6091 Unspecified otitis externa, right ear: Secondary | ICD-10-CM | POA: Diagnosis not present

## 2020-10-16 DIAGNOSIS — G510 Bell's palsy: Secondary | ICD-10-CM | POA: Diagnosis not present

## 2020-10-16 DIAGNOSIS — I5030 Unspecified diastolic (congestive) heart failure: Secondary | ICD-10-CM | POA: Diagnosis not present

## 2020-10-16 DIAGNOSIS — N183 Chronic kidney disease, stage 3 unspecified: Secondary | ICD-10-CM | POA: Diagnosis not present

## 2020-10-16 DIAGNOSIS — I480 Paroxysmal atrial fibrillation: Secondary | ICD-10-CM | POA: Diagnosis not present

## 2020-10-16 DIAGNOSIS — H9192 Unspecified hearing loss, left ear: Secondary | ICD-10-CM | POA: Diagnosis not present

## 2020-10-16 DIAGNOSIS — M109 Gout, unspecified: Secondary | ICD-10-CM | POA: Diagnosis not present

## 2020-10-16 DIAGNOSIS — H6691 Otitis media, unspecified, right ear: Secondary | ICD-10-CM | POA: Diagnosis not present

## 2020-10-20 ENCOUNTER — Other Ambulatory Visit: Payer: Self-pay | Admitting: Cardiology

## 2020-10-20 DIAGNOSIS — H6091 Unspecified otitis externa, right ear: Secondary | ICD-10-CM | POA: Diagnosis not present

## 2020-10-20 DIAGNOSIS — I495 Sick sinus syndrome: Secondary | ICD-10-CM

## 2020-10-20 DIAGNOSIS — H6691 Otitis media, unspecified, right ear: Secondary | ICD-10-CM | POA: Diagnosis not present

## 2020-10-20 DIAGNOSIS — I5030 Unspecified diastolic (congestive) heart failure: Secondary | ICD-10-CM | POA: Diagnosis not present

## 2020-10-20 DIAGNOSIS — I480 Paroxysmal atrial fibrillation: Secondary | ICD-10-CM | POA: Diagnosis not present

## 2020-10-20 DIAGNOSIS — N183 Chronic kidney disease, stage 3 unspecified: Secondary | ICD-10-CM | POA: Diagnosis not present

## 2020-10-20 DIAGNOSIS — I130 Hypertensive heart and chronic kidney disease with heart failure and stage 1 through stage 4 chronic kidney disease, or unspecified chronic kidney disease: Secondary | ICD-10-CM | POA: Diagnosis not present

## 2020-10-20 DIAGNOSIS — I4819 Other persistent atrial fibrillation: Secondary | ICD-10-CM

## 2020-10-20 DIAGNOSIS — G510 Bell's palsy: Secondary | ICD-10-CM | POA: Diagnosis not present

## 2020-10-20 DIAGNOSIS — M109 Gout, unspecified: Secondary | ICD-10-CM | POA: Diagnosis not present

## 2020-10-20 DIAGNOSIS — H9192 Unspecified hearing loss, left ear: Secondary | ICD-10-CM | POA: Diagnosis not present

## 2020-10-20 NOTE — Telephone Encounter (Signed)
Okay to refill? 

## 2020-10-21 DIAGNOSIS — G459 Transient cerebral ischemic attack, unspecified: Secondary | ICD-10-CM | POA: Diagnosis not present

## 2020-10-21 DIAGNOSIS — Z45018 Encounter for adjustment and management of other part of cardiac pacemaker: Secondary | ICD-10-CM | POA: Diagnosis not present

## 2020-10-21 DIAGNOSIS — Z950 Presence of cardiac pacemaker: Secondary | ICD-10-CM | POA: Diagnosis not present

## 2020-10-27 DIAGNOSIS — N183 Chronic kidney disease, stage 3 unspecified: Secondary | ICD-10-CM | POA: Diagnosis not present

## 2020-10-27 DIAGNOSIS — I5030 Unspecified diastolic (congestive) heart failure: Secondary | ICD-10-CM | POA: Diagnosis not present

## 2020-10-27 DIAGNOSIS — I480 Paroxysmal atrial fibrillation: Secondary | ICD-10-CM | POA: Diagnosis not present

## 2020-10-27 DIAGNOSIS — M109 Gout, unspecified: Secondary | ICD-10-CM | POA: Diagnosis not present

## 2020-10-27 DIAGNOSIS — H6091 Unspecified otitis externa, right ear: Secondary | ICD-10-CM | POA: Diagnosis not present

## 2020-10-27 DIAGNOSIS — H9192 Unspecified hearing loss, left ear: Secondary | ICD-10-CM | POA: Diagnosis not present

## 2020-10-27 DIAGNOSIS — I130 Hypertensive heart and chronic kidney disease with heart failure and stage 1 through stage 4 chronic kidney disease, or unspecified chronic kidney disease: Secondary | ICD-10-CM | POA: Diagnosis not present

## 2020-10-27 DIAGNOSIS — H6691 Otitis media, unspecified, right ear: Secondary | ICD-10-CM | POA: Diagnosis not present

## 2020-10-27 DIAGNOSIS — G510 Bell's palsy: Secondary | ICD-10-CM | POA: Diagnosis not present

## 2020-11-03 DIAGNOSIS — G510 Bell's palsy: Secondary | ICD-10-CM | POA: Diagnosis not present

## 2020-11-03 DIAGNOSIS — I5030 Unspecified diastolic (congestive) heart failure: Secondary | ICD-10-CM | POA: Diagnosis not present

## 2020-11-03 DIAGNOSIS — I130 Hypertensive heart and chronic kidney disease with heart failure and stage 1 through stage 4 chronic kidney disease, or unspecified chronic kidney disease: Secondary | ICD-10-CM | POA: Diagnosis not present

## 2020-11-03 DIAGNOSIS — M109 Gout, unspecified: Secondary | ICD-10-CM | POA: Diagnosis not present

## 2020-11-03 DIAGNOSIS — H9192 Unspecified hearing loss, left ear: Secondary | ICD-10-CM | POA: Diagnosis not present

## 2020-11-03 DIAGNOSIS — H6091 Unspecified otitis externa, right ear: Secondary | ICD-10-CM | POA: Diagnosis not present

## 2020-11-03 DIAGNOSIS — N183 Chronic kidney disease, stage 3 unspecified: Secondary | ICD-10-CM | POA: Diagnosis not present

## 2020-11-03 DIAGNOSIS — I480 Paroxysmal atrial fibrillation: Secondary | ICD-10-CM | POA: Diagnosis not present

## 2020-11-03 DIAGNOSIS — H6691 Otitis media, unspecified, right ear: Secondary | ICD-10-CM | POA: Diagnosis not present

## 2020-11-10 DIAGNOSIS — G510 Bell's palsy: Secondary | ICD-10-CM | POA: Diagnosis not present

## 2020-11-10 DIAGNOSIS — N183 Chronic kidney disease, stage 3 unspecified: Secondary | ICD-10-CM | POA: Diagnosis not present

## 2020-11-10 DIAGNOSIS — H6691 Otitis media, unspecified, right ear: Secondary | ICD-10-CM | POA: Diagnosis not present

## 2020-11-10 DIAGNOSIS — M109 Gout, unspecified: Secondary | ICD-10-CM | POA: Diagnosis not present

## 2020-11-10 DIAGNOSIS — H9192 Unspecified hearing loss, left ear: Secondary | ICD-10-CM | POA: Diagnosis not present

## 2020-11-10 DIAGNOSIS — I130 Hypertensive heart and chronic kidney disease with heart failure and stage 1 through stage 4 chronic kidney disease, or unspecified chronic kidney disease: Secondary | ICD-10-CM | POA: Diagnosis not present

## 2020-11-10 DIAGNOSIS — H6091 Unspecified otitis externa, right ear: Secondary | ICD-10-CM | POA: Diagnosis not present

## 2020-11-10 DIAGNOSIS — I480 Paroxysmal atrial fibrillation: Secondary | ICD-10-CM | POA: Diagnosis not present

## 2020-11-10 DIAGNOSIS — I5030 Unspecified diastolic (congestive) heart failure: Secondary | ICD-10-CM | POA: Diagnosis not present

## 2020-11-19 ENCOUNTER — Encounter: Payer: Self-pay | Admitting: Cardiology

## 2020-11-19 ENCOUNTER — Ambulatory Visit: Payer: Medicare HMO | Admitting: Cardiology

## 2020-11-19 ENCOUNTER — Other Ambulatory Visit: Payer: Self-pay

## 2020-11-19 VITALS — BP 126/64 | HR 66 | Temp 98.4°F | Resp 16 | Ht 67.0 in | Wt 174.0 lb

## 2020-11-19 DIAGNOSIS — I495 Sick sinus syndrome: Secondary | ICD-10-CM | POA: Diagnosis not present

## 2020-11-19 DIAGNOSIS — I10 Essential (primary) hypertension: Secondary | ICD-10-CM | POA: Diagnosis not present

## 2020-11-19 DIAGNOSIS — I4819 Other persistent atrial fibrillation: Secondary | ICD-10-CM

## 2020-11-19 NOTE — Progress Notes (Signed)
  Patient referred by Ehinger, Robert, MD for atrial fibrillation  Subjective:   Amber Kennedy, female    DOB: 10/05/1933, 87 y.o.   MRN: [id number]   Chief Complaint  Patient presents with   Hypertension   Atrial Fibrillation   Follow-up   HPI  87 y.o. Caucasian female with hypertension, hyperlipidemia, paroxysmal atrial fibrillation, sick sinu syndrome s/p pacemaker placement (04/2020), TIA (04/2020)  Patient is here with her daughter today.  She is doing well, and denies any complaints of chest pain, shortness of breath, palpitations.  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  Blood pressure is well controlled.   Current Outpatient Medications on File Prior to Visit  Medication Sig Dispense Refill   apixaban (ELIQUIS) 5 MG TABS tablet Take 1 tablet (5 mg total) by mouth 2 (two) times daily. 180 tablet 1   artificial tears (LACRILUBE) OINT ophthalmic ointment Place 1 application into the right eye 3 (three) times daily. 3.5 g 0   diltiazem (CARDIZEM CD) 180 MG 24 hr capsule Take 1 capsule (180 mg total) by mouth daily. 90 capsule 1   flecainide (TAMBOCOR) 50 MG tablet TAKE 1 TABLET BY MOUTH EVERY 12 HOURS. 60 tablet 1   losartan (COZAAR) 50 MG tablet Take 1 tablet (50 mg total) by mouth daily. 90 tablet 1   metoprolol succinate (TOPROL-XL) 100 MG 24 hr tablet Take 1 tablet (100 mg total) by mouth daily. 90 tablet 2   Multiple Vitamins-Calcium (ONE-A-DAY WOMENS FORMULA) TABS Take 1 tablet by mouth daily with breakfast.     polyethylene glycol (MIRALAX / GLYCOLAX) 17 g packet Take 17 g by mouth daily as needed. 14 each 0   rosuvastatin (CRESTOR) 20 MG tablet Take 1 tablet (20 mg total) by mouth daily. 30 tablet 11   senna-docusate (SENOKOT-S) 8.6-50 MG tablet Take 1 tablet by mouth at bedtime as needed for mild constipation.     valACYclovir (VALTREX) 1000 MG tablet Take 1 tablet (1,000 mg total) by mouth 3 (three) times daily. 4 tablet 0   vitamin C  (ASCORBIC ACID) 500 MG tablet Take 500 mg by mouth daily as needed (for supplementation).     No current facility-administered medications on file prior to visit.    Cardiovascular studies:  Remote dual-chamber pacemaker transmission 10/21/2020: AP 1%, VP 47%.  Lead impedance and thresholds normal, longevity 10 years.  P Predominantly persistent atrial fibrillation since 04/30/2020, AT/AF burden 95%. Normal pacemaker function.  Scheduled  In office pacemaker check 09/22/2020: Single (S)/Dual (D)/BV: D. Presenting: AP-VS. PR interval 346 msec Pacemaker dependant:  No AP 4.7%, VP 58%. AMS Episodes 416.  AT/AF burden 96% .  HVR 0.  Longevity 9- 10 Years. Magnet rate: >85%. Lead measurements: Stable. Histogram: Low (L)/normal (N)/high (H)  N. Patient activity Low.   Observations: Predominantly in Afib Apr-Aug 2022, converted to sinus rhythm last week Changes:None.   EKG 09/22/2020: Ventricular paced rhythm  Unscheduled (Alert) Alert 07/11/2020:  Patient in persistent atrial fibrillation, 93% burden since 05/13/2020 with controlled ventricular rate. AP 7.6%, VP 52%.  Longevity >5 years.  EKG 06/05/2020: Atrial fibrillation 107 bpm Occasional ectopic ventricular beat    Anterolateral T wave inversion, consider ischemia  Unscheduled (Alert) 05/16/2020: Persistent AF since 05/15/2020 with controlled ventricular response. AT/AF burden 80%.  Normal pacemaker function. RA paced 42% and RV 56%  Echocardiogram 04/28/2020:  1. Left ventricular ejection fraction, by estimation, is 60 to 65%. The  left ventricle has   normal function. The left ventricle has no regional  wall motion abnormalities. There is severe left ventricular hypertrophy.  Left ventricular diastolic parameters   are indeterminate.   2. Right ventricular systolic function is normal. The right ventricular  size is normal. There is mildly elevated pulmonary artery systolic  pressure.   3. Left atrial size was moderately dilated.    4. The mitral valve is degenerative. Mild mitral valve regurgitation. No  evidence of mitral stenosis. Moderate mitral annular calcification.   5. The aortic valve was not well visualized. Aortic valve regurgitation  is not visualized. No aortic stenosis is present.   6. Aortic dilatation noted. There is mild dilatation of the ascending  aorta, measuring 38 mm.   7. The inferior vena cava is normal in size with greater than 50%  respiratory variability, suggesting right atrial pressure of 3 mmHg.   CTA head/neck 04/28/2020: 1. 60% diameter stenosis proximal left internal carotid artery due to atherosclerotic disease 2. Right carotid artery widely patent without stenosis. Both vertebral arteries widely patent. 3. Atherosclerotic aortic arch.    EKG 03/24/2020: Sinus rhythm 67 bpm  Left axis deviation Possible old anteroseptal infarct  Echocardiogram 02/06/2017: - Left ventricle: The cavity size was normal. Wall thickness was   increased in a pattern of mild LVH. Systolic function was normal.   The estimated ejection fraction was in the range of 60% to 65%.   Wall motion was normal; there were no regional wall motion   abnormalities. Features are consistent with a pseudonormal left   ventricular filling pattern, with concomitant abnormal relaxation   and increased filling pressure (grade 2 diastolic dysfunction).   Indeterminate filling pressures. - Mitral valve: Mildly calcified annulus. - Left atrium: The atrium was mildly dilated. - Atrial septum: No defect or patent foramen ovale was identified. - Tricuspid valve: There was mild-moderate regurgitation. - Pulmonary arteries: PA peak pressure: 35 mm Hg (S). - Systemic veins: Dilated IVC with normal respiratory variation.   Estimated CVP 8 mmHg.  Recent labs: 05/01/2020: Glucose 98, BUN/Cr 11/0.80. EGFR >60. Na/K 136/3.7. Albumin 3.2. Total protein 5.7. Rest of the CMP normal H/H 12/39. MCV 92. Platelets 197 TSH 3.0  normal  03/13/2020: Glucose 88, BUN/Cr 15/0.94. EGFR 57. Na/K 137/4.5. Rest of the CMP normal HbA1C 5.6% Chol 155, TG 150, HDL 46, LDL 83  02/01/2019: Glucose 115, BUN/Cr 16/0.9. EGFR 57 HbA1C 5.3% Chol 189, TG 259, HDL 42, LDL 102 TSH 6.3 normal  12/30/2017: Glucose 94. BUN/Cr 16/0.94. eGFR 57. Na/K 138/4.6. HbA1C 5.4% Chol 175, TG 185, HDL 39, LDL 137.   Review of Systems  Cardiovascular:  Negative for chest pain, dyspnea on exertion, leg swelling, palpitations and syncope.         Vitals:   11/19/20 1257  BP: 126/64  Pulse: 66  Resp: 16  Temp: 98.4 F (36.9 C)  SpO2: 97%      Objective:   Physical Exam Vitals and nursing note reviewed.  Constitutional:      Appearance: She is well-developed.  Neck:     Vascular: No JVD.  Cardiovascular:     Rate and Rhythm: Normal rate. Rhythm irregular.     Pulses: Intact distal pulses.     Heart sounds: Normal heart sounds. No murmur heard. Pulmonary:     Effort: Pulmonary effort is normal.     Breath sounds: Normal breath sounds. No wheezing or rales.  Musculoskeletal:     Right lower leg: No edema.       Left lower leg: No edema.            Assessment & Recommendations:   87 y.o. Caucasian female with hypertension, hyperlipidemia, paroxysmal atrial fibrillation, sick sinus syndrome s/p pacemaker placement (04/2020), TIA (04/2020)  Persistent atrial fibrillation: Except for brief period in August 2020, patient has been in persistent A. Fib, with AT/AF burden of 95%. Given that she is in A. fib on flecainide 50 mg daily, we discussed either increasing the dose of flecainide or discontinuing flecainide and pursue rate control measures only.  Given that she is fairly rate controlled at without any symptoms from A. fib, we have mutually decided to discontinue flecainide.  Continue metoprolol and diltiazem at current doses. CHA2DS2VASc score 4. Annual stroke risk 4%. Continue Xarelto 20 mg  daily.  Hypertension: Reasonably well-controlled  Hyperlipidemia: She is reluctant to try lipid lowering therapy, and would like to make diet and lifestlye changes.  LDL is down to 102.  F/u in 3 months   [place], MD [facility] Pager: [phone] Office: [phone] If no answer Cell [phone]

## 2020-11-20 DIAGNOSIS — H6091 Unspecified otitis externa, right ear: Secondary | ICD-10-CM | POA: Diagnosis not present

## 2020-11-20 DIAGNOSIS — H9192 Unspecified hearing loss, left ear: Secondary | ICD-10-CM | POA: Diagnosis not present

## 2020-11-20 DIAGNOSIS — N183 Chronic kidney disease, stage 3 unspecified: Secondary | ICD-10-CM | POA: Diagnosis not present

## 2020-11-20 DIAGNOSIS — I480 Paroxysmal atrial fibrillation: Secondary | ICD-10-CM | POA: Diagnosis not present

## 2020-11-20 DIAGNOSIS — H6691 Otitis media, unspecified, right ear: Secondary | ICD-10-CM | POA: Diagnosis not present

## 2020-11-20 DIAGNOSIS — I130 Hypertensive heart and chronic kidney disease with heart failure and stage 1 through stage 4 chronic kidney disease, or unspecified chronic kidney disease: Secondary | ICD-10-CM | POA: Diagnosis not present

## 2020-11-20 DIAGNOSIS — I5030 Unspecified diastolic (congestive) heart failure: Secondary | ICD-10-CM | POA: Diagnosis not present

## 2020-11-20 DIAGNOSIS — M109 Gout, unspecified: Secondary | ICD-10-CM | POA: Diagnosis not present

## 2020-11-20 DIAGNOSIS — G510 Bell's palsy: Secondary | ICD-10-CM | POA: Diagnosis not present

## 2020-11-24 DIAGNOSIS — M109 Gout, unspecified: Secondary | ICD-10-CM | POA: Diagnosis not present

## 2020-11-24 DIAGNOSIS — N183 Chronic kidney disease, stage 3 unspecified: Secondary | ICD-10-CM | POA: Diagnosis not present

## 2020-11-24 DIAGNOSIS — H6091 Unspecified otitis externa, right ear: Secondary | ICD-10-CM | POA: Diagnosis not present

## 2020-11-24 DIAGNOSIS — H9192 Unspecified hearing loss, left ear: Secondary | ICD-10-CM | POA: Diagnosis not present

## 2020-11-24 DIAGNOSIS — I480 Paroxysmal atrial fibrillation: Secondary | ICD-10-CM | POA: Diagnosis not present

## 2020-11-24 DIAGNOSIS — G510 Bell's palsy: Secondary | ICD-10-CM | POA: Diagnosis not present

## 2020-11-24 DIAGNOSIS — I130 Hypertensive heart and chronic kidney disease with heart failure and stage 1 through stage 4 chronic kidney disease, or unspecified chronic kidney disease: Secondary | ICD-10-CM | POA: Diagnosis not present

## 2020-11-24 DIAGNOSIS — H6691 Otitis media, unspecified, right ear: Secondary | ICD-10-CM | POA: Diagnosis not present

## 2020-11-24 DIAGNOSIS — I5030 Unspecified diastolic (congestive) heart failure: Secondary | ICD-10-CM | POA: Diagnosis not present

## 2020-12-04 DIAGNOSIS — I5030 Unspecified diastolic (congestive) heart failure: Secondary | ICD-10-CM | POA: Diagnosis not present

## 2020-12-04 DIAGNOSIS — H9192 Unspecified hearing loss, left ear: Secondary | ICD-10-CM | POA: Diagnosis not present

## 2020-12-04 DIAGNOSIS — M109 Gout, unspecified: Secondary | ICD-10-CM | POA: Diagnosis not present

## 2020-12-04 DIAGNOSIS — G510 Bell's palsy: Secondary | ICD-10-CM | POA: Diagnosis not present

## 2020-12-04 DIAGNOSIS — I130 Hypertensive heart and chronic kidney disease with heart failure and stage 1 through stage 4 chronic kidney disease, or unspecified chronic kidney disease: Secondary | ICD-10-CM | POA: Diagnosis not present

## 2020-12-04 DIAGNOSIS — H6691 Otitis media, unspecified, right ear: Secondary | ICD-10-CM | POA: Diagnosis not present

## 2020-12-04 DIAGNOSIS — H6091 Unspecified otitis externa, right ear: Secondary | ICD-10-CM | POA: Diagnosis not present

## 2020-12-04 DIAGNOSIS — I480 Paroxysmal atrial fibrillation: Secondary | ICD-10-CM | POA: Diagnosis not present

## 2020-12-04 DIAGNOSIS — N183 Chronic kidney disease, stage 3 unspecified: Secondary | ICD-10-CM | POA: Diagnosis not present

## 2021-01-20 DIAGNOSIS — G459 Transient cerebral ischemic attack, unspecified: Secondary | ICD-10-CM | POA: Diagnosis not present

## 2021-01-20 DIAGNOSIS — Z45018 Encounter for adjustment and management of other part of cardiac pacemaker: Secondary | ICD-10-CM | POA: Diagnosis not present

## 2021-02-14 ENCOUNTER — Other Ambulatory Visit: Payer: Self-pay | Admitting: Cardiology

## 2021-02-18 ENCOUNTER — Ambulatory Visit: Payer: Medicare HMO | Admitting: Cardiology

## 2021-02-18 ENCOUNTER — Other Ambulatory Visit: Payer: Self-pay

## 2021-02-18 ENCOUNTER — Encounter: Payer: Self-pay | Admitting: Cardiology

## 2021-02-18 VITALS — BP 179/82 | HR 61 | Temp 98.0°F | Resp 15 | Ht 67.0 in | Wt 177.0 lb

## 2021-02-18 DIAGNOSIS — I495 Sick sinus syndrome: Secondary | ICD-10-CM

## 2021-02-18 DIAGNOSIS — I10 Essential (primary) hypertension: Secondary | ICD-10-CM | POA: Diagnosis not present

## 2021-02-18 DIAGNOSIS — Z950 Presence of cardiac pacemaker: Secondary | ICD-10-CM | POA: Diagnosis not present

## 2021-02-18 DIAGNOSIS — I4819 Other persistent atrial fibrillation: Secondary | ICD-10-CM | POA: Diagnosis not present

## 2021-02-18 NOTE — Progress Notes (Signed)
Patient referred by [name], MD for atrial fibrillation  Subjective:   Amber Kennedy, female    DOB: [birth year], 88 y.o.   MRN: [id number]   Chief Complaint  Patient presents with   Atrial Fibrillation   Follow-up    3 month   HPI  88 y.o. Caucasian female with hypertension, hyperlipidemia, paroxysmal atrial fibrillation, sick sinu syndrome s/p pacemaker placement (04/2020), TIA (04/2020)  Patient is here with her daughter today.  She is doing well, no recent complaints. Blood pressure elevated today. She has not been checking it at home.   Reviewed recent pacemaker interrogation findings with the patient, details below.   Current Outpatient Medications on File Prior to Visit  Medication Sig Dispense Refill   artificial tears (LACRILUBE) OINT ophthalmic ointment Place 1 application into the right eye 3 (three) times daily. 3.5 g 0   diltiazem (CARDIZEM CD) 180 MG 24 hr capsule TAKE 1 CAPSULE BY MOUTH DAILY. 90 capsule 1   ELIQUIS 5 MG TABS tablet TAKE 1 TABLET BY MOUTH 2 TIMES DAILY. 60 tablet 1   losartan (COZAAR) 50 MG tablet Take 1 tablet (50 mg total) by mouth daily. (Patient taking differently: Take 25 mg by mouth daily.) 90 tablet 1   metoprolol succinate (TOPROL-XL) 100 MG 24 hr tablet Take 1 tablet (100 mg total) by mouth daily. 90 tablet 2   Multiple Vitamins-Calcium (ONE-A-DAY WOMENS FORMULA) TABS Take 1 tablet by mouth daily with breakfast.     polyethylene glycol (MIRALAX / GLYCOLAX) 17 g packet Take 17 g by mouth daily as needed. 14 each 0   rosuvastatin (CRESTOR) 20 MG tablet Take 1 tablet (20 mg total) by mouth daily. 30 tablet 11   senna-docusate (SENOKOT-S) 8.6-50 MG tablet Take 1 tablet by mouth at bedtime as needed for mild constipation.     valACYclovir (VALTREX) 1000 MG tablet Take 1 tablet (1,000 mg total) by mouth 3 (three) times daily. 4 tablet 0   vitamin C (ASCORBIC ACID) 500 MG tablet Take 500 mg by mouth daily as needed (for supplementation).      No current facility-administered medications on file prior to visit.    Cardiovascular studies:  EKG 02/18/2021: Probable sinus rhythm 63 bpm Nonspecific T-abnormality  Remote dual-chamber pacemaker transmission 01/20/2021: AP 33%, VP 36%.  Longevity 9 years and 7 months.  Lead impedance and threshold within normal limits.  Patient had persistent atrial fibrillation since April 2022, appears to be back in sinus rhythm since end of December 2022.  AT/AF burden 60% since last transmission 3 months ago and 79% since April 2022.  Longest AF episode 6 minutes on 12/13/2020.  Normal pacemaker function.  Scheduled  In office pacemaker check 09/22/2020: Single (S)/Dual (D)/BV: D. Presenting: AP-VS. PR interval 346 msec Pacemaker dependant:  No AP 4.7%, VP 58%. AMS Episodes 416.  AT/AF burden 96% .  HVR 0.  Longevity 9- 10 Years. Magnet rate: >85%. Lead measurements: Stable. Histogram: Low (L)/normal (N)/high (H)  N. Patient activity Low.   Observations: Predominantly in Afib Apr-Aug 2022, converted to sinus rhythm last week Changes:None.   EKG 09/22/2020: Ventricular paced rhythm  Unscheduled (Alert) Alert 07/11/2020:  Patient in persistent atrial fibrillation, 93% burden since 05/13/2020 with controlled ventricular rate. AP 7.6%, VP 52%.  Longevity >5 years.  EKG 06/05/2020: Atrial fibrillation 107 bpm Occasional ectopic ventricular beat    Anterolateral T wave inversion, consider ischemia  Unscheduled (Alert) 05/16/2020: Persistent AF since 05/15/2020 with controlled ventricular response. AT/AF  burden 80%.  Normal pacemaker function. RA paced 42% and RV 56%  Echocardiogram 04/28/2020:  1. Left ventricular ejection fraction, by estimation, is 60 to 65%. The  left ventricle has normal function. The left ventricle has no regional  wall motion abnormalities. There is severe left ventricular hypertrophy.  Left ventricular diastolic parameters   are indeterminate.   2. Right ventricular  systolic function is normal. The right ventricular  size is normal. There is mildly elevated pulmonary artery systolic  pressure.   3. Left atrial size was moderately dilated.   4. The mitral valve is degenerative. Mild mitral valve regurgitation. No  evidence of mitral stenosis. Moderate mitral annular calcification.   5. The aortic valve was not well visualized. Aortic valve regurgitation  is not visualized. No aortic stenosis is present.   6. Aortic dilatation noted. There is mild dilatation of the ascending  aorta, measuring 38 mm.   7. The inferior vena cava is normal in size with greater than 50%  respiratory variability, suggesting right atrial pressure of 3 mmHg.   CTA head/neck 04/28/2020: 1. 60% diameter stenosis proximal left internal carotid artery due to atherosclerotic disease 2. Right carotid artery widely patent without stenosis. Both vertebral arteries widely patent. 3. Atherosclerotic aortic arch.    EKG 03/24/2020: Sinus rhythm 67 bpm  Left axis deviation Possible old anteroseptal infarct  Echocardiogram 02/06/2017: - Left ventricle: The cavity size was normal. Wall thickness was   increased in a pattern of mild LVH. Systolic function was normal.   The estimated ejection fraction was in the range of 60% to 65%.   Wall motion was normal; there were no regional wall motion   abnormalities. Features are consistent with a pseudonormal left   ventricular filling pattern, with concomitant abnormal relaxation   and increased filling pressure (grade 2 diastolic dysfunction).   Indeterminate filling pressures. - Mitral valve: Mildly calcified annulus. - Left atrium: The atrium was mildly dilated. - Atrial septum: No defect or patent foramen ovale was identified. - Tricuspid valve: There was mild-moderate regurgitation. - Pulmonary arteries: PA peak pressure: 35 mm Hg (S). - Systemic veins: Dilated IVC with normal respiratory variation.   Estimated CVP 8 mmHg.  Recent  labs: 05/01/2020: Glucose 98, BUN/Cr 11/0.80. EGFR >60. Na/K 136/3.7. Albumin 3.2. Total protein 5.7. Rest of the CMP normal H/H 12/39. MCV 92. Platelets 197 TSH 3.0 normal  03/13/2020: Glucose 88, BUN/Cr 15/0.94. EGFR 57. Na/K 137/4.5. Rest of the CMP normal HbA1C 5.6% Chol 155, TG 150, HDL 46, LDL 83  02/01/2019: Glucose 115, BUN/Cr 16/0.9. EGFR 57 HbA1C 5.3% Chol 189, TG 259, HDL 42, LDL 102 TSH 6.3 normal  12/30/2017: Glucose 94. BUN/Cr 16/0.94. eGFR 57. Na/K 138/4.6. HbA1C 5.4% Chol 175, TG 185, HDL 39, LDL 137.   Review of Systems  Cardiovascular:  Negative for chest pain, dyspnea on exertion, leg swelling, palpitations and syncope.         Vitals:   02/18/21 1345 02/18/21 1352  BP: (!) 193/79 (!) 171/86  Pulse: 61 63  Resp: 15   Temp: 98 F (36.7 C)   SpO2: 98%       Objective:   Physical Exam Vitals and nursing note reviewed.  Constitutional:      Appearance: She is well-developed.  Neck:     Vascular: No JVD.  Cardiovascular:     Rate and Rhythm: Normal rate and regular rhythm.     Pulses: Intact distal pulses.     Heart sounds: Normal heart  sounds. No murmur heard. Pulmonary:     Effort: Pulmonary effort is normal.     Breath sounds: Normal breath sounds. No wheezing or rales.  Musculoskeletal:     Right lower leg: No edema.     Left lower leg: No edema.            Assessment & Recommendations:   88 y.o. Caucasian female with hypertension, hyperlipidemia, paroxysmal atrial fibrillation, sick sinus syndrome s/p pacemaker placement (04/2020), TIA (04/2020)  Persistent atrial fibrillation: Back in sinus rhythm since 12/2020, without flecainide.  CHA2DS2VASc score 4. Annual stroke risk 4%. Continue Eliquis 5 mg bid.  Hypertension: BP elevated today. Generally well controlled.  Patient will check it regularly at home and communicate the findings to me in 1-2 weeks. She also has f/u w/PCP in 3 months  Hyperlipidemia: She is reluctant to try  lipid lowering therapy, and would like to make diet and lifestlye changes.  LDL is down to 102.  F/u in 3 months   [place], MD [facility] Pager: [phone] Office: [phone] If no answer Cell [phone]

## 2021-03-16 DIAGNOSIS — R7303 Prediabetes: Secondary | ICD-10-CM | POA: Diagnosis not present

## 2021-03-16 DIAGNOSIS — Z0000 Encounter for general adult medical examination without abnormal findings: Secondary | ICD-10-CM | POA: Diagnosis not present

## 2021-03-16 DIAGNOSIS — I700 Atherosclerosis of aorta: Secondary | ICD-10-CM | POA: Diagnosis not present

## 2021-03-16 DIAGNOSIS — I10 Essential (primary) hypertension: Secondary | ICD-10-CM | POA: Diagnosis not present

## 2021-03-16 DIAGNOSIS — E782 Mixed hyperlipidemia: Secondary | ICD-10-CM | POA: Diagnosis not present

## 2021-03-16 DIAGNOSIS — M109 Gout, unspecified: Secondary | ICD-10-CM | POA: Diagnosis not present

## 2021-03-16 DIAGNOSIS — I480 Paroxysmal atrial fibrillation: Secondary | ICD-10-CM | POA: Diagnosis not present

## 2021-03-16 DIAGNOSIS — N183 Chronic kidney disease, stage 3 unspecified: Secondary | ICD-10-CM | POA: Diagnosis not present

## 2021-03-16 DIAGNOSIS — Z1389 Encounter for screening for other disorder: Secondary | ICD-10-CM | POA: Diagnosis not present

## 2021-04-10 ENCOUNTER — Other Ambulatory Visit: Payer: Self-pay | Admitting: Cardiology

## 2021-05-15 ENCOUNTER — Other Ambulatory Visit: Payer: Self-pay | Admitting: Cardiology

## 2021-05-15 DIAGNOSIS — I4819 Other persistent atrial fibrillation: Secondary | ICD-10-CM

## 2021-05-15 DIAGNOSIS — I495 Sick sinus syndrome: Secondary | ICD-10-CM

## 2021-05-15 NOTE — Telephone Encounter (Signed)
Refill request

## 2021-05-20 ENCOUNTER — Ambulatory Visit: Payer: Medicare HMO | Admitting: Cardiology

## 2021-06-08 ENCOUNTER — Encounter: Payer: Self-pay | Admitting: Cardiology

## 2021-06-08 ENCOUNTER — Ambulatory Visit: Payer: Medicare HMO | Admitting: Cardiology

## 2021-06-08 VITALS — BP 157/71 | HR 77 | Temp 98.0°F | Resp 16 | Ht 67.0 in | Wt 178.0 lb

## 2021-06-08 DIAGNOSIS — I10 Essential (primary) hypertension: Secondary | ICD-10-CM | POA: Diagnosis not present

## 2021-06-08 DIAGNOSIS — I495 Sick sinus syndrome: Secondary | ICD-10-CM | POA: Diagnosis not present

## 2021-06-08 DIAGNOSIS — I480 Paroxysmal atrial fibrillation: Secondary | ICD-10-CM

## 2021-06-08 MED ORDER — METOPROLOL SUCCINATE ER 100 MG PO TB24: 100.0000 mg | ORAL_TABLET | Freq: Every day | ORAL | 3 refills | Status: DC

## 2021-06-08 MED ORDER — APIXABAN 5 MG PO TABS: 5.0000 mg | ORAL_TABLET | Freq: Two times a day (BID) | ORAL | 3 refills | Status: DC

## 2021-06-08 MED ORDER — DILTIAZEM HCL ER COATED BEADS 180 MG PO CP24: 180.0000 mg | ORAL_CAPSULE | Freq: Every day | ORAL | 3 refills | Status: DC

## 2021-06-08 MED ORDER — ROSUVASTATIN CALCIUM 20 MG PO TABS: 20.0000 mg | ORAL_TABLET | Freq: Every day | ORAL | 3 refills | Status: DC

## 2021-06-08 NOTE — Addendum Note (Signed)
Addended by: Nigel Mormon on: 06/08/2021 03:59 PM ? ? Modules accepted: Orders ? ?

## 2021-06-08 NOTE — Progress Notes (Signed)
? ? ?Patient referred by [name], MD for atrial fibrillation ? ?Subjective:  ? ?Amber Kennedy, female    DOB: 1934-01-06, 88 y.o.   MRN: [id number] ? ? ?No chief complaint on file. ? ?HPI ? ?88 y.o. Caucasian female with hypertension, hyperlipidemia, paroxysmal atrial fibrillation, sick sinu syndrome s/p pacemaker placement (04/2020), TIA (04/2020) ? ?Patient is here with her daughter today.  She is doing well, no recent complaints. Blood pressure elevated today., but usually lower at home. There is a question whether she has been out of Eliquis in the last few weeks.  ? ?Reviewed recent pacemaker interrogation findings with the patient, details below.  ? ? ?Current Outpatient Medications:  ?  artificial tears (LACRILUBE) OINT ophthalmic ointment, Place 1 application into the right eye 3 (three) times daily., Disp: 3.5 g, Rfl: 0 ?  diltiazem (CARDIZEM CD) 180 MG 24 hr capsule, TAKE 1 CAPSULE BY MOUTH DAILY., Disp: 90 capsule, Rfl: 1 ?  ELIQUIS 5 MG TABS tablet, TAKE 1 TABLET BY MOUTH 2 TIMES DAILY., Disp: 60 tablet, Rfl: 1 ?  losartan (COZAAR) 50 MG tablet, Take 1 tablet (50 mg total) by mouth daily. (Patient taking differently: Take 25 mg by mouth daily.), Disp: 90 tablet, Rfl: 1 ?  metoprolol succinate (TOPROL-XL) 100 MG 24 hr tablet, Take 1 tablet (100 mg total) by mouth daily., Disp: 90 tablet, Rfl: 2 ?  Multiple Vitamins-Calcium (ONE-A-DAY WOMENS FORMULA) TABS, Take 1 tablet by mouth daily with breakfast., Disp: , Rfl:  ?  polyethylene glycol (MIRALAX / GLYCOLAX) 17 g packet, Take 17 g by mouth daily as needed., Disp: 14 each, Rfl: 0 ?  rosuvastatin (CRESTOR) 20 MG tablet, Take 1 tablet (20 mg total) by mouth daily., Disp: 30 tablet, Rfl: 11 ?  senna-docusate (SENOKOT-S) 8.6-50 MG tablet, Take 1 tablet by mouth at bedtime as needed for mild constipation., Disp: , Rfl:  ?  valACYclovir (VALTREX) 1000 MG tablet, Take 1 tablet (1,000 mg total) by mouth 3 (three) times daily., Disp: 4 tablet, Rfl: 0 ?  vitamin  C (ASCORBIC ACID) 500 MG tablet, Take 500 mg by mouth daily as needed (for supplementation)., Disp: , Rfl:  ? ? ?Cardiovascular studies: ? ?Dual-chamber pacemaker transmission 03/24/2021: ?AP 96%, VP 29%.  Longevity 9 years and 4 months.  Lead impedance and thresholds within normal limits.  Frequent AMS episodes, longest 32 minutes of AF.  AT/AF burden 2% since 03/03/2021.  AT/AF burden 58% prior to this. ? ?EKG 02/18/2021: ?Probable sinus rhythm 63 bpm ?Nonspecific T-abnormality ? ?Remote dual-chamber pacemaker transmission 01/20/2021: ?AP 33%, VP 36%.  Longevity 9 years and 7 months.  Lead impedance and threshold within normal limits.  Patient had persistent atrial fibrillation since April 2022, appears to be back in sinus rhythm since end of December 2022.  AT/AF burden 60% since last transmission 3 months ago and 79% since April 2022.  Longest AF episode 6 minutes on 12/13/2020.  Normal pacemaker function. ? ?Scheduled  In office pacemaker check 09/22/2020: ?Single (S)/Dual (D)/BV: D. ?Presenting: AP-VS. PR interval 346 msec ?Pacemaker dependant:  No ?AP 4.7%, VP 58%. ?AMS Episodes 416.  AT/AF burden 96% .  ?HVR 0.  ?Longevity 9- 10 Years. Magnet rate: >85%. ?Lead measurements: Stable. ?Histogram: Low (L)/normal (N)/high (H)  N. Patient activity Low.  ? ?Observations: Predominantly in Afib Apr-Aug 2022, converted to sinus rhythm last week ?Changes:None. ? ?Echocardiogram 04/28/2020: ? 1. Left ventricular ejection fraction, by estimation, is 60 to 65%. The  ?left ventricle has normal  function. The left ventricle has no regional  ?wall motion abnormalities. There is severe left ventricular hypertrophy.  ?Left ventricular diastolic parameters  ? are indeterminate.  ? 2. Right ventricular systolic function is normal. The right ventricular  ?size is normal. There is mildly elevated pulmonary artery systolic  ?pressure.  ? 3. Left atrial size was moderately dilated.  ? 4. The mitral valve is degenerative. Mild mitral valve  regurgitation. No  ?evidence of mitral stenosis. Moderate mitral annular calcification.  ? 5. The aortic valve was not well visualized. Aortic valve regurgitation  ?is not visualized. No aortic stenosis is present.  ? 6. Aortic dilatation noted. There is mild dilatation of the ascending  ?aorta, measuring 38 mm.  ? 7. The inferior vena cava is normal in size with greater than 50%  ?respiratory variability, suggesting right atrial pressure of 3 mmHg.  ? ?CTA head/neck 04/28/2020: ?1. 60% diameter stenosis proximal left internal carotid artery due ?to atherosclerotic disease ?2. Right carotid artery widely patent without stenosis. Both ?vertebral arteries widely patent. ?3. Atherosclerotic aortic arch. ?  ? ?Recent labs: ?03/16/2021: ?Glucose 117, BUN/Cr 26/1.1. EGFR 46.  ?HbA1C 5.7% ?Chol 105, TG 90, HDL 49, LDL 38 ?TSH 3.0 normal ? ?05/01/2020: ?Glucose 98, BUN/Cr 11/0.80. EGFR >60. Na/K 136/3.7. Albumin 3.2. Total protein 5.7. Rest of the CMP normal ?H/H 12/39. MCV 92. Platelets 197 ?TSH 3.0 normal ? ?03/13/2020: ?Glucose 88, BUN/Cr 15/0.94. EGFR 57. Na/K 137/4.5. Rest of the CMP normal ?HbA1C 5.6% ?Chol 155, TG 150, HDL 46, LDL 83 ? ?02/01/2019: ?Glucose 115, BUN/Cr 16/0.9. EGFR 57 ?HbA1C 5.3% ?Chol 189, TG 259, HDL 42, LDL 102 ?TSH 6.3 normal ? ?12/30/2017: ?Glucose 94. BUN/Cr 16/0.94. eGFR 57. Na/K 138/4.6. ?HbA1C 5.4% ?Chol 175, TG 185, HDL 39, LDL 137. ? ? ?Review of Systems  ?Cardiovascular:  Negative for chest pain, dyspnea on exertion, leg swelling, palpitations and syncope.  ? ?   ?  ? ?Vitals:  ? 06/08/21 1132  ?BP: (!) 157/71  ?Pulse: 77  ?Resp: 16  ?Temp: 98 ?F (36.7 ?C)  ?SpO2: 97%  ? ? ? ? ?Objective:  ? Physical Exam ?Vitals and nursing note reviewed.  ?Constitutional:   ?   Appearance: She is well-developed.  ?Neck:  ?   Vascular: No JVD.  ?Cardiovascular:  ?   Rate and Rhythm: Normal rate and regular rhythm.  ?   Pulses: Intact distal pulses.  ?   Heart sounds: Normal heart sounds. No murmur  heard. ?Pulmonary:  ?   Effort: Pulmonary effort is normal.  ?   Breath sounds: Normal breath sounds. No wheezing or rales.  ?Musculoskeletal:  ?   Right lower leg: No edema.  ?   Left lower leg: No edema.  ?  ? ? ? ?  ICD-10-CM   ?1. Paroxysmal atrial fibrillation (HCC)  I48.0   ?  ?2. Essential hypertension  I10   ?  ?3. Sick sinus syndrome (HCC)  I49.5   ?  ? ? ? ?   ?Assessment & Recommendations:  ? ?88 y.o. Caucasian female with hypertension, hyperlipidemia, paroxysmal atrial fibrillation, sick sinus syndrome s/p pacemaker placement (04/2020), TIA (04/2020) ? ?Paroxysmal atrial fibrillation: ?2% AT/AF burden. ?Rate control approach reasonable. ?CHA2DS2VASc score 4. Annual stroke risk 4%. ?Continue Eliquis 5 mg bid. ? ?Hypertension: ?BP elevated today. Generally well controlled.  ?No change made today. ? ?Hyperlipidemia: ?Well controlled on Crestor. ? ?F/u in 3 months  ? ?Nigel Mormon, MD ?[facility] ?Pager: [phone] ?Office:  [phone] ?If no answer Cell [phone] ?   ?

## 2021-06-24 ENCOUNTER — Other Ambulatory Visit: Payer: Self-pay | Admitting: Cardiology

## 2021-07-21 DIAGNOSIS — Z45018 Encounter for adjustment and management of other part of cardiac pacemaker: Secondary | ICD-10-CM | POA: Diagnosis not present

## 2021-07-21 DIAGNOSIS — G459 Transient cerebral ischemic attack, unspecified: Secondary | ICD-10-CM | POA: Diagnosis not present

## 2021-09-10 ENCOUNTER — Ambulatory Visit: Payer: Medicare HMO | Admitting: Cardiology

## 2021-09-10 ENCOUNTER — Encounter: Payer: Self-pay | Admitting: Cardiology

## 2021-09-10 VITALS — BP 144/51 | HR 60 | Temp 98.0°F | Resp 16 | Ht 67.0 in | Wt 175.0 lb

## 2021-09-10 DIAGNOSIS — I10 Essential (primary) hypertension: Secondary | ICD-10-CM

## 2021-09-10 DIAGNOSIS — Z950 Presence of cardiac pacemaker: Secondary | ICD-10-CM

## 2021-09-10 DIAGNOSIS — I480 Paroxysmal atrial fibrillation: Secondary | ICD-10-CM | POA: Diagnosis not present

## 2021-09-10 MED ORDER — LOSARTAN POTASSIUM 50 MG PO TABS: 100.0000 mg | ORAL_TABLET | Freq: Every day | ORAL | 0 refills | Status: DC

## 2021-09-10 NOTE — Progress Notes (Signed)
Patient referred by Amber Arabian, MD for atrial fibrillation  Subjective:   Amber Kennedy, female    DOB: [birth year], 88 y.o.   MRN: [id number]   Chief Complaint  Patient presents with   Atrial Fibrillation   Follow-up    3 month    HPI  88 y.o. Caucasian female with hypertension, hyperlipidemia, paroxysmal atrial fibrillation, sick sinu syndrome s/p pacemaker placement (04/2020), TIA (04/2020)  Patient is here with her daughter today. She is doing well, has no complaints. However, blood pressure remains elevated.   Current Outpatient Medications:    apixaban (ELIQUIS) 5 MG TABS tablet, Take 1 tablet (5 mg total) by mouth 2 (two) times daily., Disp: 180 tablet, Rfl: 3   artificial tears (LACRILUBE) OINT ophthalmic ointment, Place 1 application into the right eye 3 (three) times daily., Disp: 3.5 g, Rfl: 0   diltiazem (CARDIZEM CD) 180 MG 24 hr capsule, Take 1 capsule (180 mg total) by mouth daily., Disp: 90 capsule, Rfl: 3   losartan (COZAAR) 50 MG tablet, TAKE 1 TABLET BY MOUTH DAILY., Disp: 90 tablet, Rfl: 1   metoprolol succinate (TOPROL-XL) 100 MG 24 hr tablet, Take 1 tablet (100 mg total) by mouth daily., Disp: 90 tablet, Rfl: 3   Multiple Vitamins-Calcium (ONE-A-DAY WOMENS FORMULA) TABS, Take 1 tablet by mouth daily with breakfast., Disp: , Rfl:    polyethylene glycol (MIRALAX / GLYCOLAX) 17 g packet, Take 17 g by mouth daily as needed., Disp: 14 each, Rfl: 0   rosuvastatin (CRESTOR) 20 MG tablet, Take 1 tablet (20 mg total) by mouth daily., Disp: 90 tablet, Rfl: 3   senna-docusate (SENOKOT-S) 8.6-50 MG tablet, Take 1 tablet by mouth at bedtime as needed for mild constipation., Disp: , Rfl:    valACYclovir (VALTREX) 1000 MG tablet, Take 1 tablet (1,000 mg total) by mouth 3 (three) times daily., Disp: 4 tablet, Rfl: 0   vitamin C (ASCORBIC ACID) 500 MG tablet, Take 500 mg by mouth daily as needed (for supplementation)., Disp: , Rfl:    Cardiovascular studies:  EKG  09/10/2021: Atrial paced rhythm Nonspecific ST-T abnormality  Remote dual-chamber pacemaker transmission 07/21/2021: RA at 95%, RV 33%. Longevity 9 years and 1 month.  Lead impedance and thresholds are normal. AT/AF burden 2.3%.  Overall this is improvement from 58% 6 months ago, last transmission 3 months ago was 2%.  Echocardiogram 04/28/2020:  1. Left ventricular ejection fraction, by estimation, is 60 to 65%. The  left ventricle has normal function. The left ventricle has no regional  wall motion abnormalities. There is severe left ventricular hypertrophy.  Left ventricular diastolic parameters   are indeterminate.   2. Right ventricular systolic function is normal. The right ventricular  size is normal. There is mildly elevated pulmonary artery systolic  pressure.   3. Left atrial size was moderately dilated.   4. The mitral valve is degenerative. Mild mitral valve regurgitation. No  evidence of mitral stenosis. Moderate mitral annular calcification.   5. The aortic valve was not well visualized. Aortic valve regurgitation  is not visualized. No aortic stenosis is present.   6. Aortic dilatation noted. There is mild dilatation of the ascending  aorta, measuring 38 mm.   7. The inferior vena cava is normal in size with greater than 50%  respiratory variability, suggesting right atrial pressure of 3 mmHg.   CTA head/neck 04/28/2020: 1. 60% diameter stenosis proximal left internal carotid artery due to atherosclerotic disease 2. Right carotid artery widely patent without  stenosis. Both vertebral arteries widely patent. 3. Atherosclerotic aortic arch.    Recent labs: 03/16/2021: Glucose 117, BUN/Cr 26/1.1. EGFR 46.  HbA1C 5.7% Chol 105, TG 90, HDL 49, LDL 38 TSH 3.0 normal   Review of Systems  Cardiovascular:  Negative for chest pain, dyspnea on exertion, leg swelling, palpitations and syncope.          Vitals:   09/10/21 1532  BP: (!) 144/51  Pulse: 60  Resp: 16   Temp: 98 F (36.7 C)  SpO2: 97%      Objective:   Physical Exam Vitals and nursing note reviewed.  Constitutional:      Appearance: She is well-developed.  Neck:     Vascular: No JVD.  Cardiovascular:     Rate and Rhythm: Normal rate and regular rhythm.     Pulses: Intact distal pulses.     Heart sounds: Normal heart sounds. No murmur heard. Pulmonary:     Effort: Pulmonary effort is normal.     Breath sounds: Normal breath sounds. No wheezing or rales.  Musculoskeletal:     Right lower leg: No edema.     Left lower leg: No edema.          ICD-10-CM   1. Paroxysmal atrial fibrillation (HCC)  I48.0 EKG 12-Lead          Assessment & Recommendations:   88 y.o. Caucasian female with hypertension, hyperlipidemia, paroxysmal atrial fibrillation, sick sinus syndrome s/p pacemaker placement (04/2020), TIA (04/2020)  Paroxysmal atrial fibrillation: 2% AT/AF burden. Rate control approach reasonable. CHA2DS2VASc score 4. Annual stroke risk 4%. Continue Eliquis 5 mg bid.  Hypertension: Uncontrolled. After much discussion, patient has agreed to increase losartan to 100 mg daily. Check BMP in 10 days.  Hyperlipidemia: Well controlled on Crestor.  F/u in 3-4 weeks to reassess blood pressure.  Nigel Mormon, MD [facility] Pager: [phone] Office: [phone] If no answer Cell [phone]

## 2021-09-13 ENCOUNTER — Encounter: Payer: Self-pay | Admitting: Cardiology

## 2021-09-15 DIAGNOSIS — R7303 Prediabetes: Secondary | ICD-10-CM | POA: Diagnosis not present

## 2021-09-15 DIAGNOSIS — I10 Essential (primary) hypertension: Secondary | ICD-10-CM | POA: Diagnosis not present

## 2021-09-15 DIAGNOSIS — N183 Chronic kidney disease, stage 3 unspecified: Secondary | ICD-10-CM | POA: Diagnosis not present

## 2021-09-15 MED ORDER — LOSARTAN POTASSIUM 50 MG PO TABS: 50.0000 mg | ORAL_TABLET | Freq: Every day | ORAL | 0 refills | Status: DC

## 2021-09-15 NOTE — Addendum Note (Signed)
Addended by: Nigel Mormon on: 09/15/2021 03:04 PM   Modules accepted: Orders

## 2021-09-29 DIAGNOSIS — I10 Essential (primary) hypertension: Secondary | ICD-10-CM | POA: Diagnosis not present

## 2021-09-30 LAB — BASIC METABOLIC PANEL
BUN/Creatinine Ratio: 17 (ref 12–28)
BUN: 19 mg/dL (ref 8–27)
CO2: 21 mmol/L (ref 20–29)
Calcium: 9.5 mg/dL (ref 8.7–10.3)
Chloride: 98 mmol/L (ref 96–106)
Creatinine, Ser: 1.09 mg/dL — ABNORMAL HIGH (ref 0.57–1.00)
Glucose: 97 mg/dL (ref 70–99)
Potassium: 5.2 mmol/L (ref 3.5–5.2)
Sodium: 134 mmol/L (ref 134–144)
eGFR: 49 mL/min/{1.73_m2} — ABNORMAL LOW (ref >59)

## 2021-10-05 ENCOUNTER — Ambulatory Visit: Payer: Medicare HMO | Admitting: Cardiology

## 2021-10-06 ENCOUNTER — Encounter: Payer: Self-pay | Admitting: Cardiology

## 2021-10-06 ENCOUNTER — Ambulatory Visit: Payer: Medicare HMO | Admitting: Cardiology

## 2021-10-06 VITALS — BP 134/59 | HR 63 | Temp 98.0°F | Resp 16 | Ht 67.0 in | Wt 173.0 lb

## 2021-10-06 DIAGNOSIS — I480 Paroxysmal atrial fibrillation: Secondary | ICD-10-CM

## 2021-10-06 DIAGNOSIS — I10 Essential (primary) hypertension: Secondary | ICD-10-CM | POA: Diagnosis not present

## 2021-10-06 NOTE — Progress Notes (Signed)
Patient referred by [name], MD for atrial fibrillation  Subjective:   KYESHIA ZINN, female    DOB: 01-20-1934, 88 y.o.   MRN: [id number]   Chief Complaint  Patient presents with   Atrial Fibrillation   Hypertension   Follow-up    3-4 week    HPI  88 y.o. Caucasian female with hypertension, hyperlipidemia, paroxysmal atrial fibrillation, sick sinu syndrome s/p pacemaker placement (04/2020), TIA (04/2020)   Patient is here with her daughter today. She is doing well, has no complaints. Blood pressure is now better controlled.   Current Outpatient Medications:    apixaban (ELIQUIS) 5 MG TABS tablet, Take 1 tablet (5 mg total) by mouth 2 (two) times daily., Disp: 180 tablet, Rfl: 3   artificial tears (LACRILUBE) OINT ophthalmic ointment, Place 1 application into the right eye 3 (three) times daily., Disp: 3.5 g, Rfl: 0   b complex vitamins capsule, Take 1 capsule by mouth daily., Disp: , Rfl:    Coenzyme Q10 (COQ10) 50 MG CAPS, Take by mouth., Disp: , Rfl:    diltiazem (CARDIZEM CD) 180 MG 24 hr capsule, Take 1 capsule (180 mg total) by mouth daily., Disp: 90 capsule, Rfl: 3   losartan (COZAAR) 50 MG tablet, Take 1 tablet (50 mg total) by mouth daily., Disp: 1 tablet, Rfl: 0   metoprolol succinate (TOPROL-XL) 100 MG 24 hr tablet, Take 1 tablet (100 mg total) by mouth daily., Disp: 90 tablet, Rfl: 3   Multiple Vitamins-Calcium (ONE-A-DAY WOMENS FORMULA) TABS, Take 1 tablet by mouth daily with breakfast., Disp: , Rfl:    polyethylene glycol (MIRALAX / GLYCOLAX) 17 g packet, Take 17 g by mouth daily as needed., Disp: 14 each, Rfl: 0   rosuvastatin (CRESTOR) 20 MG tablet, Take 1 tablet (20 mg total) by mouth daily., Disp: 90 tablet, Rfl: 3   valACYclovir (VALTREX) 1000 MG tablet, Take 1 tablet (1,000 mg total) by mouth 3 (three) times daily., Disp: 4 tablet, Rfl: 0   vitamin C (ASCORBIC ACID) 500 MG tablet, Take 500 mg by mouth daily as needed (for supplementation)., Disp: ,  Rfl:    vitamin E 1000 UNIT capsule, Take 1,000 Units by mouth daily., Disp: , Rfl:    Cardiovascular studies:  EKG 09/10/2021: Atrial paced rhythm Nonspecific ST-T abnormality  Remote dual-chamber pacemaker transmission 07/21/2021: RA at 95%, RV 33%. Longevity 9 years and 1 month.  Lead impedance and thresholds are normal. AT/AF burden 2.3%.  Overall this is improvement from 58% 6 months ago, last transmission 3 months ago was 2%.  Echocardiogram 04/28/2020:  1. Left ventricular ejection fraction, by estimation, is 60 to 65%. The  left ventricle has normal function. The left ventricle has no regional  wall motion abnormalities. There is severe left ventricular hypertrophy.  Left ventricular diastolic parameters   are indeterminate.   2. Right ventricular systolic function is normal. The right ventricular  size is normal. There is mildly elevated pulmonary artery systolic  pressure.   3. Left atrial size was moderately dilated.   4. The mitral valve is degenerative. Mild mitral valve regurgitation. No  evidence of mitral stenosis. Moderate mitral annular calcification.   5. The aortic valve was not well visualized. Aortic valve regurgitation  is not visualized. No aortic stenosis is present.   6. Aortic dilatation noted. There is mild dilatation of the ascending  aorta, measuring 38 mm.   7. The inferior vena cava is normal in size with greater than 50%  respiratory  variability, suggesting right atrial pressure of 3 mmHg.   CTA head/neck 04/28/2020: 1. 60% diameter stenosis proximal left internal carotid artery due to atherosclerotic disease 2. Right carotid artery widely patent without stenosis. Both vertebral arteries widely patent. 3. Atherosclerotic aortic arch.    Recent labs: 09/29/2021: Glucose 97, BUN/Cr 19/1.09. EGFR 49. Na/K 134/5.2.   03/16/2021: Glucose 117, BUN/Cr 26/1.1. EGFR 46.  HbA1C 5.7% Chol 105, TG 90, HDL 49, LDL 38 TSH 3.0 normal   Review of Systems   Cardiovascular:  Negative for chest pain, dyspnea on exertion, leg swelling, palpitations and syncope.          Vitals:   10/06/21 1428  BP: (!) 134/59  Pulse: 63  Resp: 16  Temp: 98 F (36.7 C)  SpO2: 97%      Objective:   Physical Exam Vitals and nursing note reviewed.  Constitutional:      Appearance: She is well-developed.  Neck:     Vascular: No JVD.  Cardiovascular:     Rate and Rhythm: Normal rate and regular rhythm.     Pulses: Intact distal pulses.     Heart sounds: Normal heart sounds. No murmur heard. Pulmonary:     Effort: Pulmonary effort is normal.     Breath sounds: Normal breath sounds. No wheezing or rales.  Musculoskeletal:     Right lower leg: No edema.     Left lower leg: No edema.          ICD-10-CM   1. Paroxysmal atrial fibrillation (HCC)  I48.0     2. Essential hypertension  I10           Assessment & Recommendations:   88 y.o. Caucasian female with hypertension, hyperlipidemia, paroxysmal atrial fibrillation, sick sinus syndrome s/p pacemaker placement (04/2020), TIA (04/2020)  Paroxysmal atrial fibrillation: 2% AT/AF burden. Rate control approach reasonable. CHA2DS2VASc score 4. Annual stroke risk 4%. Continue Eliquis 5 mg bid.  Hypertension: Now well controlled. Reviewed BMP, within normal limits.  Hyperlipidemia: Well controlled on Crestor.  F/u in 6 months  Nigel Mormon, MD Pager: [phone] Office: [phone]

## 2021-10-20 DIAGNOSIS — Z45018 Encounter for adjustment and management of other part of cardiac pacemaker: Secondary | ICD-10-CM | POA: Diagnosis not present

## 2021-10-20 DIAGNOSIS — G459 Transient cerebral ischemic attack, unspecified: Secondary | ICD-10-CM | POA: Diagnosis not present

## 2022-01-19 DIAGNOSIS — G459 Transient cerebral ischemic attack, unspecified: Secondary | ICD-10-CM | POA: Diagnosis not present

## 2022-01-19 DIAGNOSIS — Z45018 Encounter for adjustment and management of other part of cardiac pacemaker: Secondary | ICD-10-CM | POA: Diagnosis not present

## 2022-01-30 ENCOUNTER — Other Ambulatory Visit: Payer: Self-pay | Admitting: Cardiology

## 2022-03-17 NOTE — Progress Notes (Unsigned)
Chief Complaint  Patient presents with   Pacemaker Check   Encounter for care of pacemaker  Pacemaker [employer] Assurity MRI dual chamber pacemaker 04/30/2020  Sick sinus syndrome ([place])  Scheduled  In office pacemaker check 03/17/22  Single (S)/Dual (D)/BV: ***. Presenting ***. Pacemaker dependant:  ***. Underlying ***. AP ***%, VP ***%. BP ***%. AMS Episodes ***.  AT/AF burden ***% . Longest ***. Latest ***. HVR ***. Longest ***. Latest ***. Longevity *** Years. Magnet rate: >85%. Lead measurements: Stable. Thoracic impedance: ***. Histogram: Low (L)/normal (N)/high (H)  ***. Patient activity ***.   Observations: ***. Changes: ***.

## 2022-03-18 ENCOUNTER — Ambulatory Visit: Payer: Medicare HMO | Admitting: Cardiology

## 2022-03-18 ENCOUNTER — Encounter: Payer: Self-pay | Admitting: Cardiology

## 2022-03-18 DIAGNOSIS — I495 Sick sinus syndrome: Secondary | ICD-10-CM

## 2022-03-18 DIAGNOSIS — Z45018 Encounter for adjustment and management of other part of cardiac pacemaker: Secondary | ICD-10-CM

## 2022-03-18 DIAGNOSIS — I480 Paroxysmal atrial fibrillation: Secondary | ICD-10-CM

## 2022-03-18 DIAGNOSIS — Z950 Presence of cardiac pacemaker: Secondary | ICD-10-CM

## 2022-03-23 DIAGNOSIS — I6522 Occlusion and stenosis of left carotid artery: Secondary | ICD-10-CM | POA: Diagnosis not present

## 2022-03-23 DIAGNOSIS — Z0000 Encounter for general adult medical examination without abnormal findings: Secondary | ICD-10-CM | POA: Diagnosis not present

## 2022-03-23 DIAGNOSIS — I10 Essential (primary) hypertension: Secondary | ICD-10-CM | POA: Diagnosis not present

## 2022-03-23 DIAGNOSIS — I480 Paroxysmal atrial fibrillation: Secondary | ICD-10-CM | POA: Diagnosis not present

## 2022-03-23 DIAGNOSIS — E782 Mixed hyperlipidemia: Secondary | ICD-10-CM | POA: Diagnosis not present

## 2022-03-23 DIAGNOSIS — I700 Atherosclerosis of aorta: Secondary | ICD-10-CM | POA: Diagnosis not present

## 2022-03-23 DIAGNOSIS — Z1331 Encounter for screening for depression: Secondary | ICD-10-CM | POA: Diagnosis not present

## 2022-03-23 DIAGNOSIS — R7303 Prediabetes: Secondary | ICD-10-CM | POA: Diagnosis not present

## 2022-03-23 DIAGNOSIS — N183 Chronic kidney disease, stage 3 unspecified: Secondary | ICD-10-CM | POA: Diagnosis not present

## 2022-04-15 ENCOUNTER — Ambulatory Visit: Payer: Medicare HMO | Admitting: Cardiology

## 2022-04-20 DIAGNOSIS — Z45018 Encounter for adjustment and management of other part of cardiac pacemaker: Secondary | ICD-10-CM | POA: Diagnosis not present

## 2022-04-20 DIAGNOSIS — G459 Transient cerebral ischemic attack, unspecified: Secondary | ICD-10-CM | POA: Diagnosis not present

## 2022-06-16 ENCOUNTER — Ambulatory Visit: Payer: Medicare HMO | Admitting: Cardiology

## 2022-06-25 ENCOUNTER — Other Ambulatory Visit: Payer: Self-pay | Admitting: Cardiology

## 2022-06-30 ENCOUNTER — Other Ambulatory Visit: Payer: Self-pay

## 2022-06-30 MED ORDER — APIXABAN 5 MG PO TABS: 5.0000 mg | ORAL_TABLET | Freq: Two times a day (BID) | ORAL | 3 refills | Status: DC

## 2022-07-20 DIAGNOSIS — Z950 Presence of cardiac pacemaker: Secondary | ICD-10-CM | POA: Diagnosis not present

## 2022-07-20 DIAGNOSIS — G459 Transient cerebral ischemic attack, unspecified: Secondary | ICD-10-CM | POA: Diagnosis not present

## 2022-07-26 ENCOUNTER — Ambulatory Visit: Payer: Medicare HMO | Admitting: Cardiology

## 2022-07-26 ENCOUNTER — Other Ambulatory Visit: Payer: Self-pay | Admitting: Cardiology

## 2022-07-26 DIAGNOSIS — I10 Essential (primary) hypertension: Secondary | ICD-10-CM

## 2022-07-26 DIAGNOSIS — I480 Paroxysmal atrial fibrillation: Secondary | ICD-10-CM

## 2022-08-11 ENCOUNTER — Encounter: Payer: Self-pay | Admitting: Cardiology

## 2022-08-11 ENCOUNTER — Ambulatory Visit: Payer: Medicare HMO | Admitting: Cardiology

## 2022-08-11 VITALS — BP 139/65 | HR 61 | Ht 67.0 in | Wt 182.0 lb

## 2022-08-11 DIAGNOSIS — E782 Mixed hyperlipidemia: Secondary | ICD-10-CM | POA: Diagnosis not present

## 2022-08-11 DIAGNOSIS — I10 Essential (primary) hypertension: Secondary | ICD-10-CM

## 2022-08-11 DIAGNOSIS — I480 Paroxysmal atrial fibrillation: Secondary | ICD-10-CM

## 2022-08-11 MED ORDER — APIXABAN 5 MG PO TABS: 5.0000 mg | ORAL_TABLET | Freq: Two times a day (BID) | ORAL | 3 refills | Status: DC

## 2022-08-11 MED ORDER — DILTIAZEM HCL ER COATED BEADS 180 MG PO CP24: 180.0000 mg | ORAL_CAPSULE | Freq: Every day | ORAL | 3 refills | Status: DC

## 2022-08-11 MED ORDER — LOSARTAN POTASSIUM 50 MG PO TABS: 50.0000 mg | ORAL_TABLET | Freq: Every day | ORAL | 3 refills | Status: DC

## 2022-08-11 MED ORDER — METOPROLOL SUCCINATE ER 100 MG PO TB24: 100.0000 mg | ORAL_TABLET | Freq: Every day | ORAL | 3 refills | Status: DC

## 2022-08-11 MED ORDER — ROSUVASTATIN CALCIUM 20 MG PO TABS: 20.0000 mg | ORAL_TABLET | Freq: Every day | ORAL | 3 refills | Status: DC

## 2022-08-11 NOTE — Progress Notes (Signed)
Patient referred by Blair Heys, MD for atrial fibrillation  Subjective:   Amber Kennedy, female    DOB: 10/12/1933, 89 y.o.   MRN: [id number]   Chief Complaint  Patient presents with   Hypertension   Atrial Fibrillation    HPI  89 y.o. Caucasian female with hypertension, hyperlipidemia, paroxysmal atrial fibrillation, sick sinu syndrome s/p pacemaker placement (04/2020), TIA (04/2020)  Patient is here with her daughter today. She is doing well, has no complaints. Blood pressure is well controlled. She admits not taking statin for about a week, as she wanted to "enjoy her grapefruits". She reports occasional brief episodes of Afib as noted as palptiations.    Current Outpatient Medications:    apixaban (ELIQUIS) 5 MG TABS tablet, Take 1 tablet (5 mg total) by mouth 2 (two) times daily., Disp: 180 tablet, Rfl: 3   artificial tears (LACRILUBE) OINT ophthalmic ointment, Place 1 application into the right eye 3 (three) times daily., Disp: 3.5 g, Rfl: 0   b complex vitamins capsule, Take 1 capsule by mouth daily., Disp: , Rfl:    Coenzyme Q10 (COQ10) 50 MG CAPS, Take by mouth., Disp: , Rfl:    diltiazem (CARDIZEM CD) 180 MG 24 hr capsule, TAKE 1 CAPSULE (180 MG TOTAL) BY MOUTH DAILY., Disp: 90 capsule, Rfl: 0   losartan (COZAAR) 50 MG tablet, TAKE 1 TABLET BY MOUTH DAILY., Disp: 90 tablet, Rfl: 0   metoprolol succinate (TOPROL-XL) 100 MG 24 hr tablet, TAKE 1 TABLET (100 MG TOTAL) BY MOUTH DAILY., Disp: 90 tablet, Rfl: 0   Multiple Vitamins-Calcium (ONE-A-DAY WOMENS FORMULA) TABS, Take 1 tablet by mouth daily with breakfast., Disp: , Rfl:    polyethylene glycol (MIRALAX / GLYCOLAX) 17 g packet, Take 17 g by mouth daily as needed., Disp: 14 each, Rfl: 0   rosuvastatin (CRESTOR) 20 MG tablet, TAKE 1 TABLET (20 MG TOTAL) BY MOUTH DAILY., Disp: 90 tablet, Rfl: 0   vitamin C (ASCORBIC ACID) 500 MG tablet, Take 500 mg by mouth daily as needed (for supplementation)., Disp: , Rfl:    vitamin  E 1000 UNIT capsule, Take 1,000 Units by mouth daily., Disp: , Rfl:    Cardiovascular studies:  EKG 08/11/2022: Atrial paced rhythm Nonspecific T-abnormality  Remote dual-chamber pacemaker transmission 07/20/2022: Longevity 7 years and 8 months.  AP 93 %, VP 66 %.   Lead impedance and thresholds are normal. AT/AF burden 5.6%, stable from previous  Longest AF episode 5 hours on 07/08/2022.  Normal pacemaker function.  Scheduled  In office pacemaker check 03/18/2022 Single (S)/Dual (D)/BV: D. Presenting: AP-VS. PR interval 309 msec Pacemaker dependant:  No. ASVS @ 45/min AP 91%, VP 53%. AMS Episodes 416.  AT/AF burden 8.1% .  HVR 0.  Longevity 9- 10 Years. Magnet rate: >85%. Lead measurements: Stable. Histogram: Low (L)/normal (N)/high (H)  N. Patient activity Low.  Observations: Normal pacemaker function Changes: PVAAP to 120 to 110/min to detect AF burden  Remote dual-chamber pacemaker transmission 07/21/2021: RA at 95%, RV 33%. Longevity 9 years and 1 month.  Lead impedance and thresholds are normal. AT/AF burden 2.3%.  Overall this is improvement from 58% 6 months ago, last transmission 3 months ago was 2%.  Echocardiogram 04/28/2020:  1. Left ventricular ejection fraction, by estimation, is 60 to 65%. The  left ventricle has normal function. The left ventricle has no regional  wall motion abnormalities. There is severe left ventricular hypertrophy.  Left ventricular diastolic parameters   are indeterminate.  2. Right ventricular systolic function is normal. The right ventricular  size is normal. There is mildly elevated pulmonary artery systolic  pressure.   3. Left atrial size was moderately dilated.   4. The mitral valve is degenerative. Mild mitral valve regurgitation. No  evidence of mitral stenosis. Moderate mitral annular calcification.   5. The aortic valve was not well visualized. Aortic valve regurgitation  is not visualized. No aortic stenosis is present.   6. Aortic  dilatation noted. There is mild dilatation of the ascending  aorta, measuring 38 mm.   7. The inferior vena cava is normal in size with greater than 50%  respiratory variability, suggesting right atrial pressure of 3 mmHg.   CTA head/neck 04/28/2020: 1. 60% diameter stenosis proximal left internal carotid artery due to atherosclerotic disease 2. Right carotid artery widely patent without stenosis. Both vertebral arteries widely patent. 3. Atherosclerotic aortic arch.    Recent labs: 03/23/2022: Glucose 117, BUN/Cr 23/1.2. EGFR 44. Na/K K 4.9 Hb 12.8 HbA1C 5.7% Chol 192, TG 150, HDL 43, LDL 122  09/29/2021: Glucose 97, BUN/Cr 19/1.09. EGFR 49. Na/K 134/5.2.   03/16/2021: Glucose 117, BUN/Cr 26/1.1. EGFR 46.  HbA1C 5.7% Chol 105, TG 90, HDL 49, LDL 38 TSH 3.0 normal   Review of Systems  Cardiovascular:  Negative for chest pain, dyspnea on exertion, leg swelling, palpitations and syncope.          Vitals:   08/11/22 1358  BP: 139/65  Pulse: 61  SpO2: 96%       Objective:   Physical Exam Vitals and nursing note reviewed.  Constitutional:      Appearance: She is well-developed.  Neck:     Vascular: No JVD.  Cardiovascular:     Rate and Rhythm: Normal rate and regular rhythm.     Pulses: Intact distal pulses.     Heart sounds: Normal heart sounds. No murmur heard. Pulmonary:     Effort: Pulmonary effort is normal.     Breath sounds: Normal breath sounds. No wheezing or rales.  Musculoskeletal:     Right lower leg: No edema.     Left lower leg: No edema.       Visit diagnoses:    ICD-10-CM   1. Paroxysmal atrial fibrillation (HCC)  I48.0 EKG 12-Lead    2. Essential hypertension  I10     3. Mixed hyperlipidemia  E78.2            Assessment & Recommendations:   89 y.o. Caucasian female with hypertension, hyperlipidemia, paroxysmal atrial fibrillation, sick sinus syndrome s/p pacemaker placement (04/2020), TIA (04/2020)  Paroxysmal atrial  fibrillation: 5.6% AT/AF burden.Minimally symptomatic.  Rate control approach reasonable. CHA2DS2VASc score 4. Annual stroke risk 4%. Continue Eliquis 5 mg bid.  Hypertension: Controlled.  Hyperlipidemia: Recent noncompliance. Encouraged her to take statin at leasst 5 times week.  F/u in 1 year    Elder Negus, MD Pager: [phone] Office: [phone]

## 2022-10-19 DIAGNOSIS — Z45018 Encounter for adjustment and management of other part of cardiac pacemaker: Secondary | ICD-10-CM | POA: Diagnosis not present

## 2022-10-19 DIAGNOSIS — G459 Transient cerebral ischemic attack, unspecified: Secondary | ICD-10-CM | POA: Diagnosis not present

## 2022-12-02 ENCOUNTER — Ambulatory Visit (INDEPENDENT_AMBULATORY_CARE_PROVIDER_SITE_OTHER): Payer: Medicare HMO

## 2022-12-02 DIAGNOSIS — I480 Paroxysmal atrial fibrillation: Secondary | ICD-10-CM | POA: Diagnosis not present

## 2022-12-21 NOTE — Progress Notes (Signed)
Remote pacemaker transmission.   

## 2022-12-23 LAB — CUP PACEART REMOTE DEVICE CHECK
Battery Remaining Longevity: 92 mo
Battery Remaining Percentage: 77 %
Battery Voltage: 3.02 V
Brady Statistic AP VP Percent: 3.5 %
Brady Statistic AP VS Percent: 87 %
Brady Statistic AS VP Percent: 1 %
Brady Statistic AS VS Percent: 9.3 %
Brady Statistic RA Percent Paced: 85 %
Brady Statistic RV Percent Paced: 3.7 %
Date Time Interrogation Session: 20241107020013
Implantable Lead Connection Status: 753985
Implantable Lead Connection Status: 753985
Implantable Lead Implant Date: 20220406
Implantable Lead Implant Date: 20220406
Implantable Lead Location: 753859
Implantable Lead Location: 753860
Implantable Pulse Generator Implant Date: 20220406
Lead Channel Impedance Value: 410 Ohm
Lead Channel Impedance Value: 610 Ohm
Lead Channel Pacing Threshold Amplitude: 0.625 V
Lead Channel Pacing Threshold Amplitude: 0.75 V
Lead Channel Pacing Threshold Pulse Width: 0.4 ms
Lead Channel Pacing Threshold Pulse Width: 0.4 ms
Lead Channel Sensing Intrinsic Amplitude: 12 mV
Lead Channel Sensing Intrinsic Amplitude: 3.4 mV
Lead Channel Setting Pacing Amplitude: 1.625
Lead Channel Setting Pacing Amplitude: 2 V
Lead Channel Setting Pacing Pulse Width: 0.4 ms
Lead Channel Setting Sensing Sensitivity: 2 mV
Pulse Gen Model: 2272
Pulse Gen Serial Number: 3911950

## 2023-03-03 ENCOUNTER — Ambulatory Visit (INDEPENDENT_AMBULATORY_CARE_PROVIDER_SITE_OTHER): Payer: Self-pay

## 2023-03-03 DIAGNOSIS — I495 Sick sinus syndrome: Secondary | ICD-10-CM

## 2023-03-11 LAB — CUP PACEART REMOTE DEVICE CHECK
Battery Remaining Longevity: 91 mo
Battery Remaining Percentage: 75 %
Battery Voltage: 3.02 V
Brady Statistic AP VP Percent: 3.4 %
Brady Statistic AP VS Percent: 80 %
Brady Statistic AS VP Percent: 1 %
Brady Statistic AS VS Percent: 16 %
Brady Statistic RA Percent Paced: 77 %
Brady Statistic RV Percent Paced: 3.7 %
Date Time Interrogation Session: 20250207035600
Implantable Lead Connection Status: 753985
Implantable Lead Connection Status: 753985
Implantable Lead Implant Date: 20220406
Implantable Lead Implant Date: 20220406
Implantable Lead Location: 753859
Implantable Lead Location: 753860
Implantable Pulse Generator Implant Date: 20220406
Lead Channel Impedance Value: 460 Ohm
Lead Channel Impedance Value: 610 Ohm
Lead Channel Pacing Threshold Amplitude: 0.75 V
Lead Channel Pacing Threshold Amplitude: 0.75 V
Lead Channel Pacing Threshold Pulse Width: 0.4 ms
Lead Channel Pacing Threshold Pulse Width: 0.4 ms
Lead Channel Sensing Intrinsic Amplitude: 12 mV
Lead Channel Sensing Intrinsic Amplitude: 4.2 mV
Lead Channel Setting Pacing Amplitude: 1.75 V
Lead Channel Setting Pacing Amplitude: 2 V
Lead Channel Setting Pacing Pulse Width: 0.4 ms
Lead Channel Setting Sensing Sensitivity: 2 mV
Pulse Gen Model: 2272
Pulse Gen Serial Number: 3911950

## 2023-03-28 ENCOUNTER — Encounter: Payer: Self-pay | Admitting: Internal Medicine

## 2023-04-07 NOTE — Addendum Note (Signed)
 Addended by: Elease Etienne A on: 04/07/2023 01:49 PM   Modules accepted: Orders

## 2023-04-07 NOTE — Progress Notes (Signed)
 Remote pacemaker transmission.

## 2023-06-02 ENCOUNTER — Ambulatory Visit (INDEPENDENT_AMBULATORY_CARE_PROVIDER_SITE_OTHER): Payer: Self-pay

## 2023-06-02 DIAGNOSIS — I495 Sick sinus syndrome: Secondary | ICD-10-CM | POA: Diagnosis not present

## 2023-06-02 LAB — CUP PACEART REMOTE DEVICE CHECK
Battery Remaining Longevity: 85 mo
Battery Remaining Percentage: 72 %
Battery Voltage: 3.02 V
Brady Statistic AP VP Percent: 3 %
Brady Statistic AP VS Percent: 76 %
Brady Statistic AS VP Percent: 1 %
Brady Statistic AS VS Percent: 20 %
Brady Statistic RA Percent Paced: 61 %
Brady Statistic RV Percent Paced: 4.6 %
Date Time Interrogation Session: 20250508033611
Implantable Lead Connection Status: 753985
Implantable Lead Connection Status: 753985
Implantable Lead Implant Date: 20220406
Implantable Lead Implant Date: 20220406
Implantable Lead Location: 753859
Implantable Lead Location: 753860
Implantable Pulse Generator Implant Date: 20220406
Lead Channel Impedance Value: 400 Ohm
Lead Channel Impedance Value: 590 Ohm
Lead Channel Pacing Threshold Amplitude: 0.625 V
Lead Channel Pacing Threshold Amplitude: 0.75 V
Lead Channel Pacing Threshold Pulse Width: 0.4 ms
Lead Channel Pacing Threshold Pulse Width: 0.4 ms
Lead Channel Sensing Intrinsic Amplitude: 12 mV
Lead Channel Sensing Intrinsic Amplitude: 2.7 mV
Lead Channel Setting Pacing Amplitude: 1.625
Lead Channel Setting Pacing Amplitude: 2 V
Lead Channel Setting Pacing Pulse Width: 0.4 ms
Lead Channel Setting Sensing Sensitivity: 2 mV
Pulse Gen Model: 2272
Pulse Gen Serial Number: 3911950

## 2023-06-22 ENCOUNTER — Ambulatory Visit: Payer: Self-pay | Admitting: Cardiology

## 2023-07-11 NOTE — Progress Notes (Signed)
 Remote pacemaker transmission.

## 2023-08-11 ENCOUNTER — Ambulatory Visit: Payer: Self-pay | Admitting: Cardiology

## 2023-08-19 ENCOUNTER — Other Ambulatory Visit: Payer: Self-pay | Admitting: Cardiology

## 2023-08-19 DIAGNOSIS — I480 Paroxysmal atrial fibrillation: Secondary | ICD-10-CM

## 2023-08-19 DIAGNOSIS — I10 Essential (primary) hypertension: Secondary | ICD-10-CM

## 2023-08-25 ENCOUNTER — Ambulatory Visit: Admitting: Cardiology

## 2023-08-25 ENCOUNTER — Other Ambulatory Visit: Payer: Self-pay | Admitting: Cardiology

## 2023-09-01 ENCOUNTER — Ambulatory Visit (INDEPENDENT_AMBULATORY_CARE_PROVIDER_SITE_OTHER): Payer: Self-pay

## 2023-09-01 ENCOUNTER — Other Ambulatory Visit: Payer: Self-pay | Admitting: Cardiology

## 2023-09-01 DIAGNOSIS — I495 Sick sinus syndrome: Secondary | ICD-10-CM | POA: Diagnosis not present

## 2023-09-01 DIAGNOSIS — I480 Paroxysmal atrial fibrillation: Secondary | ICD-10-CM

## 2023-09-01 LAB — CUP PACEART REMOTE DEVICE CHECK
Battery Remaining Longevity: 82 mo
Battery Remaining Percentage: 70 %
Battery Voltage: 3.02 V
Brady Statistic AP VP Percent: 3.1 %
Brady Statistic AP VS Percent: 75 %
Brady Statistic AS VP Percent: 1 %
Brady Statistic AS VS Percent: 21 %
Brady Statistic RA Percent Paced: 55 %
Brady Statistic RV Percent Paced: 5.4 %
Date Time Interrogation Session: 20250807021844
Implantable Lead Connection Status: 753985
Implantable Lead Connection Status: 753985
Implantable Lead Implant Date: 20220406
Implantable Lead Implant Date: 20220406
Implantable Lead Location: 753859
Implantable Lead Location: 753860
Implantable Pulse Generator Implant Date: 20220406
Lead Channel Impedance Value: 400 Ohm
Lead Channel Impedance Value: 610 Ohm
Lead Channel Pacing Threshold Amplitude: 0.75 V
Lead Channel Pacing Threshold Amplitude: 1.125 V
Lead Channel Pacing Threshold Pulse Width: 0.4 ms
Lead Channel Pacing Threshold Pulse Width: 0.4 ms
Lead Channel Sensing Intrinsic Amplitude: 12 mV
Lead Channel Sensing Intrinsic Amplitude: 4.2 mV
Lead Channel Setting Pacing Amplitude: 2 V
Lead Channel Setting Pacing Amplitude: 2.125
Lead Channel Setting Pacing Pulse Width: 0.4 ms
Lead Channel Setting Sensing Sensitivity: 2 mV
Pulse Gen Model: 2272
Pulse Gen Serial Number: 3911950

## 2023-09-01 NOTE — Telephone Encounter (Addendum)
 Prescription refill request for Eliquis  received. Indication: Afib  Last office visit: 08/11/22 [facility]) - pt has scheduled follow-up appt on 09/13/23  Scr: 1.14 (04/05/23 via [facility]) Age: 89 Weight: 82.6kg  Appropriate dose. Refill sent.

## 2023-09-13 ENCOUNTER — Ambulatory Visit: Attending: Cardiology | Admitting: Cardiology

## 2023-09-13 ENCOUNTER — Encounter: Payer: Self-pay | Admitting: Cardiology

## 2023-09-13 VITALS — BP 142/82 | HR 66 | Ht 65.0 in | Wt 169.4 lb

## 2023-09-13 DIAGNOSIS — R600 Localized edema: Secondary | ICD-10-CM | POA: Diagnosis not present

## 2023-09-13 DIAGNOSIS — I480 Paroxysmal atrial fibrillation: Secondary | ICD-10-CM

## 2023-09-13 DIAGNOSIS — I10 Essential (primary) hypertension: Secondary | ICD-10-CM

## 2023-09-13 MED ORDER — ROSUVASTATIN CALCIUM 20 MG PO TABS: 20.0000 mg | ORAL_TABLET | Freq: Every day | ORAL | 3 refills | Status: AC

## 2023-09-13 MED ORDER — APIXABAN 5 MG PO TABS: 5.0000 mg | ORAL_TABLET | Freq: Two times a day (BID) | ORAL | 3 refills | Status: AC

## 2023-09-13 MED ORDER — LOSARTAN POTASSIUM 50 MG PO TABS: 50.0000 mg | ORAL_TABLET | Freq: Every day | ORAL | 3 refills | Status: AC

## 2023-09-13 MED ORDER — DILTIAZEM HCL ER COATED BEADS 180 MG PO CP24
180.0000 mg | ORAL_CAPSULE | Freq: Every day | ORAL | 3 refills | Status: AC
Start: 2023-09-13 — End: ?

## 2023-09-13 MED ORDER — METOPROLOL SUCCINATE ER 100 MG PO TB24: 100.0000 mg | ORAL_TABLET | Freq: Every day | ORAL | 3 refills | Status: AC

## 2023-09-13 NOTE — Progress Notes (Signed)
 Cardiology Office Note:  .   Date:  09/13/2023  ID:  Amber Kennedy, DOB 03-Dec-1933, MRN [id number] PCP: Hugh Charleston, MD (Inactive)  [facility] Providers Cardiologist:  Newman Lawrence, MD PCP: Hugh Charleston, MD (Inactive)  Chief Complaint  Patient presents with   PAF     Amber Kennedy is a 89 y.o. female with hypertension, hyperlipidemia, paroxysmal atrial fibrillation, sick sinu syndrome s/p pacemaker placement (04/2020), TIA (04/2020)   History of Present Illness  Patient is here today with her daughter.  She is doing well.  She does have episodes of palpitations, but are short lasting and not troublesome.  She denies any chest pain, shortness of breath, leg edema.  Blood pressure slightly elevated, but generally well-controlled.  She does not want any additional medications at this time.     Vitals:   09/13/23 1129  BP: (!) 142/82  Pulse: 66  SpO2: 96%      Review of Systems  Cardiovascular:  Positive for palpitations. Negative for chest pain, dyspnea on exertion, leg swelling and syncope.        Studies Reviewed: SABRA     EKG 09/13/2023: Sinus rhythm with sinus arrhythmia with 1st degree A-V block Nonspecific ST and T wave abnormality No previous ECGs available    Pacemaker interrogation 08/2023: PPM Scheduled remote reviewed. Normal device function.  Presenting rhythm: AP-AS/VS with non-conducted PACs.  Known PAF, longest 4 days 17 min on 07/29/23 at 2:40 am, V rates > 110 bpm ~ 65% during AT/AF, burden 25% since 10/02/22, on Eliquis  per Epic.  Next remote 91 days.   Labs 03/2023: Chol 154, TG 173, HDL 42, LDL 82 HbA1C 5.6% Hb 12.8 Cr 1.0 TSH 3.0  Risk Assessment/Calculations:    CHA2DS2-VASc Score = 6  This indicates a 9.7% annual risk of stroke. The patient's score is based upon: CHF History: 0 HTN History: 1 Diabetes History: 0 Stroke History: 2 Vascular Disease History: 0 Age Score: 2 Gender Score: 1    Physical Exam Vitals  and nursing note reviewed.  Constitutional:      General: She is not in acute distress. Neck:     Vascular: No JVD.  Cardiovascular:     Rate and Rhythm: Normal rate and regular rhythm.     Heart sounds: Normal heart sounds. No murmur heard. Pulmonary:     Effort: Pulmonary effort is normal.     Breath sounds: Normal breath sounds. No wheezing or rales.  Musculoskeletal:     Right lower leg: Edema (Trace) present.     Left lower leg: Edema (Trace) present.      VISIT DIAGNOSES:   ICD-10-CM   1. Paroxysmal atrial fibrillation (HCC)  I48.0 EKG 12-Lead    apixaban  (ELIQUIS ) 5 MG TABS tablet    metoprolol  succinate (TOPROL -XL) 100 MG 24 hr tablet    Basic metabolic panel with GFR    2. Essential hypertension  I10 EKG 12-Lead    metoprolol  succinate (TOPROL -XL) 100 MG 24 hr tablet    3. Leg edema  R60.0 Pro b natriuretic peptide (BNP)    ECHOCARDIOGRAM COMPLETE       Amber Kennedy is a 89 y.o. female with hypertension, hyperlipidemia, paroxysmal atrial fibrillation, sick sinu syndrome s/p pacemaker placement (04/2020), TIA (04/2020)   Assessment & Plan  Paroxysmal atrial fibrillation: 25% AT/AF burden, up from 5-6% in 2024. .Minimally symptomatic.  Rate control approach reasonable. Continue Eliquis  5 mg bid. Check BMP today to ensure appropriate Eliquis  dose.  Hypertension: Mild elevated.  She does not add any additional medications at this time.  Reasonable to continue metoprolol , diltiazem , losartan  at current doses.    Mixed hyperlipidemia: Fairly well-controlled on Crestor  20 mg daily, continue the same.  Leg edema: Trace.  No other heart failure signs or symptoms.  She wants to avoid taking diuretic as far as possible. Will check echocardiogram and proBNP today.     Meds ordered this encounter  Medications   apixaban  (ELIQUIS ) 5 MG TABS tablet    Sig: Take 1 tablet (5 mg total) by mouth 2 (two) times daily.    Dispense:  180 tablet    Refill:  3    90 day  supply   diltiazem  (CARDIZEM  CD) 180 MG 24 hr capsule    Sig: Take 1 capsule (180 mg total) by mouth daily.    Dispense:  90 capsule    Refill:  3   losartan  (COZAAR ) 50 MG tablet    Sig: Take 1 tablet (50 mg total) by mouth daily.    Dispense:  90 tablet    Refill:  3   metoprolol  succinate (TOPROL -XL) 100 MG 24 hr tablet    Sig: Take 1 tablet (100 mg total) by mouth daily.    Dispense:  90 tablet    Refill:  3   rosuvastatin  (CRESTOR ) 20 MG tablet    Sig: Take 1 tablet (20 mg total) by mouth daily.    Dispense:  90 tablet    Refill:  3     F/u in 1 year  Signed, Newman JINNY Lawrence, MD

## 2023-09-13 NOTE — Patient Instructions (Signed)
 Medication Instructions:  Prescriptions refilled   *If you need a refill on your cardiac medications before your next appointment, please call your pharmacy*  Lab Work: BMP PROBNP  If you have labs (blood work) drawn today and your tests are completely normal, you will receive your results only by: MyChart Message (if you have MyChart) OR A paper copy in the mail If you have any lab test that is abnormal or we need to change your treatment, we will call you to review the results.  Testing/Procedures: ECHO  Your physician has requested that you have an echocardiogram. Echocardiography is a painless test that uses sound waves to create images of your heart. It provides your doctor with information about the size and shape of your heart and how well your heart's chambers and valves are working. This procedure takes approximately one hour. There are no restrictions for this procedure. Please do NOT wear cologne, perfume, aftershave, or lotions (deodorant is allowed). Please arrive 15 minutes prior to your appointment time.  Please note: We ask at that you not bring children with you during ultrasound (echo/ vascular) testing. Due to room size and safety concerns, children are not allowed in the ultrasound rooms during exams. Our front office staff cannot provide observation of children in our lobby area while testing is being conducted. An adult accompanying a patient to their appointment will only be allowed in the ultrasound room at the discretion of the ultrasound technician under special circumstances. We apologize for any inconvenience.  Follow-Up: At [facility], you and your health needs are our priority.  As part of our continuing mission to provide you with exceptional heart care, our providers are all part of one team.  This team includes your primary Cardiologist (physician) and Advanced Practice Providers or APPs (Physician Assistants and Nurse Practitioners) who all work  together to provide you with the care you need, when you need it.  Your next appointment:   1 year(s)  Provider:   Newman JINNY Lawrence, MD

## 2023-09-14 ENCOUNTER — Ambulatory Visit: Payer: Self-pay | Admitting: Cardiology

## 2023-09-14 LAB — BASIC METABOLIC PANEL WITH GFR
BUN/Creatinine Ratio: 20 (ref 12–28)
BUN: 19 mg/dL (ref 8–27)
CO2: 22 mmol/L (ref 20–29)
Calcium: 9.6 mg/dL (ref 8.7–10.3)
Chloride: 99 mmol/L (ref 96–106)
Creatinine, Ser: 0.96 mg/dL (ref 0.57–1.00)
Glucose: 86 mg/dL (ref 70–99)
Potassium: 4.5 mmol/L (ref 3.5–5.2)
Sodium: 137 mmol/L (ref 134–144)
eGFR: 57 mL/min/1.73 — ABNORMAL LOW (ref >59)

## 2023-09-14 LAB — PRO B NATRIURETIC PEPTIDE: NT-Pro BNP: 1848 pg/mL — ABNORMAL HIGH (ref 0–738)

## 2023-09-14 NOTE — Progress Notes (Signed)
 ProBNP is elevated, suggesting some fluid retention. This could also be due to the amount of Afib patient is having. I am aware that she was not keen on starting diuretics, but please give her the option of low dose lasix  20 mg daily.  Thanks MJP

## 2023-09-19 MED ORDER — FUROSEMIDE 20 MG PO TABS
20.0000 mg | ORAL_TABLET | Freq: Every day | ORAL | 3 refills | Status: AC
Start: 2023-09-19 — End: ?

## 2023-09-19 NOTE — Progress Notes (Signed)
 MyChart message containing providers result note and interpretation read by patient on: Last read by Ricka MARLA Purdue at 12:45PM on 09/19/2023.

## 2023-09-19 NOTE — Telephone Encounter (Signed)
 Results to be mailed as requested by patient.

## 2023-10-18 ENCOUNTER — Ambulatory Visit (HOSPITAL_COMMUNITY)
Admission: RE | Admit: 2023-10-18 | Discharge: 2023-10-18 | Disposition: A | Discharge status: Home / Self Care | Source: Ambulatory Visit | Attending: Internal Medicine | Admitting: Internal Medicine

## 2023-10-18 DIAGNOSIS — R600 Localized edema: Secondary | ICD-10-CM | POA: Insufficient documentation

## 2023-10-18 DIAGNOSIS — I4891 Unspecified atrial fibrillation: Secondary | ICD-10-CM

## 2023-10-18 LAB — ECHOCARDIOGRAM COMPLETE: S' Lateral: 2.9 cm

## 2023-10-19 ENCOUNTER — Other Ambulatory Visit (HOSPITAL_COMMUNITY)

## 2023-10-22 NOTE — Progress Notes (Signed)
 Remote PPM Transmission

## 2023-10-23 NOTE — Progress Notes (Signed)
 Heart function is mildly reduced, likely due to Afib. We may need to consider medication- amiodarone to try and control Afib percentage as well as rate, without which I expect it will cause more fluid retention due to low EF. If patient is agreeable, recommend checking baseline TSH, LFT and starting amiodarone 400 mg bid for 10 days, followed by 200 mg bid. Then, repeat LFT and TSH in 3 months.  Thanks MJP

## 2023-10-24 NOTE — Progress Notes (Signed)
 Noted.  Please keep us  posted if there is any worsening of leg edema or new shortness of breath symptoms.  Thanks MJP

## 2023-11-11 ENCOUNTER — Encounter: Payer: Self-pay | Admitting: Cardiology

## 2023-12-01 ENCOUNTER — Ambulatory Visit: Payer: Self-pay

## 2023-12-01 DIAGNOSIS — I480 Paroxysmal atrial fibrillation: Secondary | ICD-10-CM

## 2023-12-01 LAB — CUP PACEART REMOTE DEVICE CHECK
Battery Remaining Longevity: 79 mo
Battery Remaining Percentage: 67 %
Battery Voltage: 3.01 V
Brady Statistic AP VP Percent: 3.2 %
Brady Statistic AP VS Percent: 75 %
Brady Statistic AS VP Percent: 1 %
Brady Statistic AS VS Percent: 21 %
Brady Statistic RA Percent Paced: 45 %
Brady Statistic RV Percent Paced: 8.2 %
Date Time Interrogation Session: 20251106035846
Implantable Lead Connection Status: 753985
Implantable Lead Connection Status: 753985
Implantable Lead Implant Date: 20220406
Implantable Lead Implant Date: 20220406
Implantable Lead Location: 753859
Implantable Lead Location: 753860
Implantable Pulse Generator Implant Date: 20220406
Lead Channel Impedance Value: 400 Ohm
Lead Channel Impedance Value: 590 Ohm
Lead Channel Pacing Threshold Amplitude: 0.625 V
Lead Channel Pacing Threshold Amplitude: 0.75 V
Lead Channel Pacing Threshold Pulse Width: 0.4 ms
Lead Channel Pacing Threshold Pulse Width: 0.4 ms
Lead Channel Sensing Intrinsic Amplitude: 12 mV
Lead Channel Sensing Intrinsic Amplitude: 2.6 mV
Lead Channel Setting Pacing Amplitude: 1.625
Lead Channel Setting Pacing Amplitude: 2 V
Lead Channel Setting Pacing Pulse Width: 0.4 ms
Lead Channel Setting Sensing Sensitivity: 2 mV
Pulse Gen Model: 2272
Pulse Gen Serial Number: 3911950

## 2023-12-04 ENCOUNTER — Ambulatory Visit: Payer: Self-pay | Admitting: Student in an Organized Health Care Education/Training Program

## 2023-12-05 ENCOUNTER — Ambulatory Visit: Payer: Self-pay | Admitting: Internal Medicine

## 2023-12-06 NOTE — Progress Notes (Signed)
 Remote PPM Transmission

## 2023-12-15 ENCOUNTER — Telehealth: Payer: Self-pay

## 2023-12-15 NOTE — Telephone Encounter (Signed)
 Pt contacted by EP scheduling to set up appointment to establish for pacemaker follow up.  Per Pt she does not have a way to the office at this time.  She will contact her daughter and call back to schedule appointment.

## 2024-03-01 ENCOUNTER — Ambulatory Visit

## 2024-05-31 ENCOUNTER — Ambulatory Visit

## 2024-08-30 ENCOUNTER — Ambulatory Visit

## 2024-11-29 ENCOUNTER — Ambulatory Visit

## 2025-02-28 ENCOUNTER — Ambulatory Visit

## 2025-05-30 ENCOUNTER — Ambulatory Visit
